# Patient Record
Sex: Male | Born: 1965 | Race: White | Hispanic: No | Marital: Single | Smoking: Current every day smoker
Health system: Southern US, Community
[De-identification: ages and names within clinical notes are randomized; demographics above are authoritative.]

## PROBLEM LIST (undated history)

## (undated) DIAGNOSIS — R000 Tachycardia, unspecified: Secondary | ICD-10-CM

## (undated) DIAGNOSIS — F419 Anxiety disorder, unspecified: Secondary | ICD-10-CM

## (undated) DIAGNOSIS — I10 Essential (primary) hypertension: Secondary | ICD-10-CM

## (undated) DIAGNOSIS — J449 Chronic obstructive pulmonary disease, unspecified: Secondary | ICD-10-CM

## (undated) HISTORY — PX: FACIAL COSMETIC SURGERY: SHX629

## (undated) HISTORY — PX: THORACOTOMY: SHX5074

---

## 2004-12-01 ENCOUNTER — Ambulatory Visit: Payer: Self-pay | Admitting: Internal Medicine

## 2006-02-16 ENCOUNTER — Ambulatory Visit: Payer: Self-pay | Admitting: Internal Medicine

## 2006-03-27 ENCOUNTER — Ambulatory Visit: Payer: Self-pay | Admitting: Internal Medicine

## 2006-06-08 ENCOUNTER — Ambulatory Visit: Payer: Self-pay | Admitting: Internal Medicine

## 2006-08-08 ENCOUNTER — Ambulatory Visit: Payer: Self-pay | Admitting: Internal Medicine

## 2006-10-12 ENCOUNTER — Ambulatory Visit: Payer: Self-pay | Admitting: Internal Medicine

## 2006-12-15 ENCOUNTER — Ambulatory Visit: Payer: Self-pay | Admitting: Internal Medicine

## 2007-02-15 ENCOUNTER — Ambulatory Visit: Payer: Self-pay | Admitting: Internal Medicine

## 2007-04-04 DIAGNOSIS — F329 Major depressive disorder, single episode, unspecified: Secondary | ICD-10-CM | POA: Insufficient documentation

## 2007-04-04 DIAGNOSIS — I10 Essential (primary) hypertension: Secondary | ICD-10-CM | POA: Insufficient documentation

## 2007-04-04 DIAGNOSIS — F3289 Other specified depressive episodes: Secondary | ICD-10-CM | POA: Insufficient documentation

## 2007-06-18 ENCOUNTER — Ambulatory Visit: Payer: Self-pay | Admitting: Internal Medicine

## 2007-06-18 DIAGNOSIS — J449 Chronic obstructive pulmonary disease, unspecified: Secondary | ICD-10-CM | POA: Insufficient documentation

## 2007-06-18 DIAGNOSIS — F410 Panic disorder [episodic paroxysmal anxiety] without agoraphobia: Secondary | ICD-10-CM | POA: Insufficient documentation

## 2007-06-18 DIAGNOSIS — J309 Allergic rhinitis, unspecified: Secondary | ICD-10-CM | POA: Insufficient documentation

## 2007-10-11 ENCOUNTER — Ambulatory Visit: Payer: Self-pay | Admitting: Internal Medicine

## 2008-01-08 ENCOUNTER — Ambulatory Visit: Payer: Self-pay | Admitting: Internal Medicine

## 2008-01-08 DIAGNOSIS — F17210 Nicotine dependence, cigarettes, uncomplicated: Secondary | ICD-10-CM | POA: Insufficient documentation

## 2008-04-08 ENCOUNTER — Ambulatory Visit: Payer: Self-pay | Admitting: Internal Medicine

## 2008-04-29 ENCOUNTER — Telehealth: Payer: Self-pay | Admitting: Internal Medicine

## 2008-07-07 ENCOUNTER — Ambulatory Visit: Payer: Self-pay | Admitting: Internal Medicine

## 2008-11-26 ENCOUNTER — Emergency Department (HOSPITAL_COMMUNITY): Admission: EM | Admit: 2008-11-26 | Discharge: 2008-11-26 | Payer: Self-pay | Admitting: Emergency Medicine

## 2009-03-04 ENCOUNTER — Telehealth: Payer: Self-pay | Admitting: Internal Medicine

## 2009-03-10 ENCOUNTER — Ambulatory Visit: Payer: Self-pay | Admitting: Internal Medicine

## 2009-06-03 ENCOUNTER — Ambulatory Visit: Payer: Self-pay | Admitting: Internal Medicine

## 2009-06-16 ENCOUNTER — Telehealth: Payer: Self-pay | Admitting: Internal Medicine

## 2009-10-16 ENCOUNTER — Telehealth: Payer: Self-pay | Admitting: Internal Medicine

## 2009-10-17 ENCOUNTER — Ambulatory Visit: Payer: Self-pay | Admitting: Internal Medicine

## 2009-10-17 DIAGNOSIS — J019 Acute sinusitis, unspecified: Secondary | ICD-10-CM | POA: Insufficient documentation

## 2009-12-31 ENCOUNTER — Telehealth: Payer: Self-pay | Admitting: Internal Medicine

## 2010-11-04 NOTE — Progress Notes (Signed)
Summary: REQ FOR RETURN CALL?  Phone Note Call from Patient   Caller: Patient [phone] Call For: bonnye/Mounir Skipper Reason for Call: Talk to Nurse, Talk to Doctor Summary of Call: Pt called to speak with [facility], LPN  (currently w/ a pt).... Pt adv that he has had a cold x 3 wks, doesn't seem to be getting any better..... Pt req that Bonnye, LPN return his call so that he may have something sent into pharmacy.... Pt was adv that msg would be passed along but he may need to come in for OV.  Pt adv that he can be reached @ [phone].  Initial call taken by: Debbra Riding,  October 16, 2009 3:12 PM  Follow-up for Phone Call        cold for 3 weeks-cough,wheezing- needs to go to saturday clinic Follow-up by: Willy Eddy, LPN,  October 16, 2009 3:56 PM  Additional Follow-up for Phone Call Additional follow up Details #1::        Phone Call Completed-------Pt scheduled for Sat. Clinic (10/17/2009) @ 11am. Additional Follow-up by: Debbra Riding,  October 16, 2009 4:13 PM

## 2010-11-04 NOTE — Assessment & Plan Note (Signed)
Summary: 3 month rov/njr   Vital Signs:  Patient Profile:   45 Years Old Male Height:     72 inches Weight:      274 pounds Temp:     98.2 degrees F oral Pulse rate:   80 / minute Resp:     14 per minute BP sitting:   120 / 80  (left arm)  Vitals Entered By: Willy Eddy, LPN (April 09, 6643 10:16 AM)                 Chief Complaint:  roa.  History of Present Illness:  Follow-Up Visit      This is a 45 year old man who presents for Follow-up visit.  The patient denies chest pain, palpitations, dizziness, syncope, low blood sugar symptoms, high blood sugar symptoms, edema, SOB, DOE, PND, and orthopnea.  Since the last visit the patient notes no new problems or concerns.  The patient reports taking meds as prescribed and monitoring BP.  When questioned about possible medication side effects, the patient notes none.    Hypertension History:      He denies headache, chest pain, palpitations, dyspnea with exertion, orthopnea, PND, peripheral edema, visual symptoms, neurologic problems, syncope, and side effects from treatment.        Positive major cardiovascular risk factors include hypertension and current tobacco user.  Negative major cardiovascular risk factors include male age less than 23 years old.        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   Family History:    Reviewed history from 06/18/2007 and no changes required:       Family History Depression       Family Hsitory Headaches       Family History Hypertension  Social History:    Reviewed history from 06/18/2007 and no changes required:       Single       Current Smoker    Review of Systems  The  patient denies anorexia, fever, weight loss, weight gain, vision loss, decreased hearing, hoarseness, chest pain, syncope, dyspnea on exertion, peripheral edema, prolonged cough, headaches, hemoptysis, abdominal pain, melena, hematochezia, severe indigestion/heartburn, hematuria, incontinence, genital sores, muscle weakness, suspicious skin lesions, transient blindness, difficulty walking, depression, unusual weight change, abnormal bleeding, enlarged lymph nodes, angioedema, and breast masses.     Physical Exam  General:     Well-developed,well-nourished,in no acute distress; alert,appropriate and cooperative throughout examination Eyes:     pupils equal and pupils round.   Ears:     R ear normal and L ear normal.   Nose:     no external deformity, no external erythema, and no nasal discharge.   Lungs:     normal respiratory effort and no accessory muscle use.   Heart:     normal rate and regular rhythm.   Abdomen:     Bowel sounds positive,abdomen soft and non-tender without masses, organomegaly or hernias noted. Msk:     No deformity or scoliosis noted of thoracic or lumbar spine.   Pulses:  R and L carotid,radial,femoral,dorsalis pedis and posterior tibial pulses are full and equal bilaterally Neurologic:     alert & oriented X3.      Impression & Recommendations:  Problem # 1:  TOBACCO USE (ICD-305.1) Encouraged smoking cessation and discussed different methods for smoking cessation.   Problem # 2:  COPD (ICD-496) asthma and copd stable not cough  Problem # 3:  HYPERTENSION (ICD-401.9)  His updated medication list for this problem includes:    Benicar 40 Mg Tabs (Olmesartan medoxomil) .Marland Kitchen... Take 1 tablet once a day  BP today: 120/80 Prior BP: 132/84 (01/08/2008)  Prior 10 Yr Risk Heart Disease: Not enough information (01/08/2008)   Problem # 4:  DEPRESSION (ICD-311)  His updated medication list for this problem includes:    Cymbalta 60 Mg Cpep (Duloxetine  hcl) ..... One by mouth daily    Klonopin 1 Mg Tabs (Clonazepam) ..... One by mouth three times a day as needed Discussed treatment options, including trial of antidpressant medication. Will refer to behavioral health. Follow-up call in in 24-48 hours and recheck in 2 weeks, sooner as needed. Patient agrees to call if any worsening of symptoms or thoughts of doing harm arise. Verified that the patient has no suicidal ideation at this time.   Complete Medication List: 1)  Benicar 40 Mg Tabs (Olmesartan medoxomil) .... Take 1 tablet once a day 2)  Nasonex 50 Mcg/act Susp (Mometasone furoate) .... Spray 2 spray as directed once a day 3)  Cymbalta 60 Mg Cpep (Duloxetine hcl) .... One by mouth daily 4)  Klonopin 1 Mg Tabs (Clonazepam) .... One by mouth three times a day as needed  Hypertension Assessment/Plan:      The patient's hypertensive risk group is category B: At least one risk factor (excluding diabetes) with no target organ damage.  Today's blood pressure is 120/80.  His blood pressure goal is < 140/90.   Patient Instructions: 1)  Please schedule a follow-up appointment in 3 months.   ]

## 2010-11-04 NOTE — Assessment & Plan Note (Signed)
Summary: 3 month rov/njr   Vital Signs:  Patient Profile:   45 Years Old Male Height:     72 inches Weight:      278 pounds Temp:     98.5 degrees F oral Pulse rate:   80 / minute Resp:     14 per minute BP sitting:   146 / 82  (left arm) Cuff size:   small  Vitals Entered By: Willy Eddy, LPN (July 07, 2008 10:22 AM)                 Chief Complaint:  roa- out of cymbalta samples.  History of Present Illness: out of symbalta for two weeks had increased temperment and anxiety blood pressure follow up  Hypertension History:      He denies headache, chest pain, palpitations, dyspnea with exertion, orthopnea, PND, peripheral edema, visual symptoms, neurologic problems, syncope, and side effects from treatment.        Positive major cardiovascular risk factors include hypertension and current tobacco user.  Negative major cardiovascular risk factors include male age less than 84 years old.       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   Family History:    Reviewed history from 06/18/2007 and no changes required:       Family History Depression       Family Hsitory Headaches       Family History Hypertension  Social History:    Reviewed history from 06/18/2007 and no changes required:       Single       Current Smoker    Review of Systems  The patient denies anorexia, fever, weight loss, weight gain, vision loss, decreased hearing, hoarseness, chest pain, syncope, dyspnea on exertion, peripheral edema, prolonged cough, headaches, hemoptysis, abdominal pain, melena, hematochezia, severe indigestion/heartburn, hematuria, incontinence, genital sores, muscle  weakness, suspicious skin lesions, transient blindness, difficulty walking, depression, unusual weight change, abnormal bleeding, enlarged lymph nodes, angioedema, breast masses, and testicular masses.     Physical Exam  General:     Well-developed,well-nourished,in no acute distress; alert,appropriate and cooperative throughout examination Eyes:     pupils equal and pupils round.   Nose:     no external deformity, no external erythema, and no nasal discharge.   Mouth:     Oral mucosa and oropharynx without lesions or exudates.  Teeth in good repair. Lungs:     normal respiratory effort and no accessory muscle use.   Heart:     normal rate and regular rhythm.   Abdomen:     Bowel sounds positive,abdomen soft and non-tender without masses, organomegaly or hernias noted. Msk:     No deformity or scoliosis noted of thoracic or lumbar spine.   Pulses:     R and L carotid,radial,femoral,dorsalis pedis and posterior tibial pulses are full and equal bilaterally Extremities:     No clubbing, cyanosis, edema, or deformity noted with normal full range of motion of all joints.   Skin:     Intact without suspicious lesions or rashes Psych:  Oriented X3, memory intact for recent and remote, normally interactive, and labile affect.      Impression & Recommendations:  Problem # 1:  HYPERTENSION (ICD-401.9) repeat   130/80 His updated medication list for this problem includes:    Benicar 40 Mg Tabs (Olmesartan medoxomil) .Marland Kitchen... Take 1 tablet once a day  BP today: 146/82 Prior BP: 120/80 (04/08/2008)  Prior 10 Yr Risk Heart Disease: Not enough information (01/08/2008)   Problem # 2:  TOBACCO USE (ICD-305.1) Encouraged smoking cessation and discussed different methods for smoking cessation.  discussion of cessation ( economic forces have worked to decrease the amount of smoking)   Problem # 3:  COPD (ICD-496) still smoking,    [place] of the lozengers  Problem # 4:  ALLERGIC  RHINITIS (ICD-477.9) discussion of seasonla aggergies and use of OTC and meds The following medications were removed from the medication list:    Promethazine Hcl 25 Mg Tabs (Promethazine hcl) .Marland Kitchen... 1 every 4-6 hours as needed nausea and vomiting  His updated medication list for this problem includes:    Nasonex 50 Mcg/act Susp (Mometasone furoate) ..... Spray 2 spray as directed once a day Discussed use of allergy medications and environmental measures.   Problem # 5:  DEPRESSION (ICD-311)  His updated medication list for this problem includes:    Cymbalta 60 Mg Cpep (Duloxetine hcl) ..... One by mouth daily    Klonopin 1 Mg Tabs (Clonazepam) ..... One by mouth three times a day as needed Discussed treatment options, including trial of antidpressant medication. Will refer to behavioral health. Follow-up call in in 24-48 hours and recheck in 2 weeks, sooner as needed. Patient agrees to call if any worsening of symptoms or thoughts of doing harm arise. Verified that the patient has no suicidal ideation at this time.   Complete Medication List: 1)  Benicar 40 Mg Tabs (Olmesartan medoxomil) .... Take 1 tablet once a day 2)  Nasonex 50 Mcg/act Susp (Mometasone furoate) .... Spray 2 spray as directed once a day 3)  Cymbalta 60 Mg Cpep (Duloxetine hcl) .... One by mouth daily 4)  Klonopin 1 Mg Tabs (Clonazepam) .... One by mouth three times a day as needed  Hypertension Assessment/Plan:      The patient's hypertensive risk group is category B: At least one risk factor (excluding diabetes) with no target organ damage.  Today's blood pressure is 146/82.  His blood pressure goal is < 140/90.   Patient Instructions: 1)  Please schedule a follow-up appointment in 2 months.   ]

## 2010-11-04 NOTE — Assessment & Plan Note (Signed)
Summary: 2 MONGH ROA/JLS/pt rescd/ccm   Vital Signs:  Patient Profile:   45 Years Old Male Height:     72 inches Weight:      286 pounds Temp:     97.5 degrees F oral Pulse rate:   76 / minute Resp:     14 per minute BP sitting:   130 / 80  (left arm)  Vitals Entered By: Willy Eddy, LPN (June 18, 2007 3:53 PM)                 Chief Complaint:  roa after changing effexor to pristiq.  History of Present Illness: Effexor works best resumed smoking Pristique not working as well. Has had panic attacks. 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   Family History:    Reviewed history and no changes required:       Family History Depression       Family Hsitory Headaches       Family History Hypertension  Social History:    Reviewed history and no changes required:       Single       Current Smoker   Risk Factors:  Tobacco use:  current   Review of Systems  The patient denies fever, weight loss, vision loss, decreased hearing, hoarseness, chest pain, syncope, dyspnea on exhertion, peripheral edema, prolonged cough, abdominal pain, melena, severe indigestion/heartburn, muscle weakness, suspicious skin lesions, unusual weight change, abnormal bleeding, enlarged lymph nodes, and angioedema.     Physical Exam  General:     Well-developed,well-nourished,in no acute distress; alert,appropriate and cooperative throughout examination Head:     normocephalic.   Eyes:     pupils equal and pupils round.   Ears:     R ear normal and L ear normal.   Nose:     no external deformity, no external erythema, and no nasal discharge.   Abdomen:     soft, no guarding, and  no rigidity.   Pulses:     R and L carotid,radial,femoral,dorsalis pedis and posterior tibial pulses are full and equal bilaterally Extremities:     No clubbing, cyanosis, edema, or deformity noted with normal full range of motion of all joints.   Neurologic:     alert & oriented X3.      Impression & Recommendations:  Problem # 1:  PANIC ATTACK, ACUTE (ICD-300.01)  The following medications were removed from the medication list:    Effexor Xr 150 Mg Cp24 (Venlafaxine hcl) .Marland Kitchen... Take 1 capsule by mouth once a day  His updated medication list for this problem includes:    Cymbalta 60 Mg Cpep (Duloxetine hcl) ..... One by mouth daily    Klonopin 1 Mg Tabs (Clonazepam) ..... One by mouth tid Discussed medication use and relaxation techniques.   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 Take 1 tablet once a day  BP today: 130/80   Problem # 4:  ALLERGIC RHINITIS (ICD-477.9)  His updated medication list for this problem includes:    Nasonex 50 Mcg/act Susp (Mometasone furoate) ..... Spray 2 spray as directed once a day Discussed use of allergy medications and environmental measures.   Complete Medication List: 1)  Benicar 40 Mg Tabs (Olmesartan medoxomil) .... Take 1 tablet once a day 2)  Nasonex 50 Mcg/act Susp (Mometasone furoate) .... Spray 2 spray as directed once a day 3)  Cymbalta 60 Mg Cpep (Duloxetine hcl) .... One by mouth daily 4)  Klonopin 1 Mg Tabs (Clonazepam) .... One by mouth tid   Patient Instructions: 1)  Please schedule a follow-up appointment in 6-7weeks.    Prescriptions: KLONOPIN 1 MG  TABS (CLONAZEPAM) one by mouth tid  #90 x 6   Entered and Authorized by:   Stacie Glaze MD   Signed by:   Stacie Glaze MD on 06/18/2007   Method used:   Print then Give to Patient   RxID:    [id number]  ]

## 2010-11-04 NOTE — Assessment & Plan Note (Signed)
Summary: ANXIETY ATTACKS/BMW   Vital Signs:  Patient profile:   45 year old male Height:      72 inches Weight:      264 pounds BMI:     35.93 Temp:     98.2 degrees F oral Pulse rate:   76 / minute Resp:     14 per minute BP sitting:   150 / 90  (left arm)  Vitals Entered By: Willy Eddy, LPN (March 10, 9810 3:58 PM)  CC:  c/o increased anxiety but since starting back on klonopin 3 days ago is feeling better- not taking any other medsc.  History of Present Illness:  Follow-Up Visit      This is a 45 year old man who presents for Follow-up visit.  The patient denies chest pain, palpitations, dizziness, syncope, low blood sugar symptoms, high blood sugar symptoms, edema, SOB, DOE, PND, and orthopnea.  Since the last visit the patient notes problems with medications.  The patient reports taking meds as prescribed.  When questioned about possible medication side effects, the patient notes none.    Follow-Up Visit      panic attacks.  The patient denies chest pain, palpitations, dizziness, syncope, low blood sugar symptoms, high blood sugar symptoms, edema, SOB, DOE, PND, and orthopnea.  The patient reports not taking meds as prescribed and not monitoring BP.    Hypertension History:      He denies headache, chest pain, palpitations, dyspnea with exertion, orthopnea, PND, peripheral edema, visual symptoms, neurologic problems, syncope, and side effects from treatment.  out of meds.        Positive major cardiovascular risk factors include hypertension and current tobacco user.  Negative major cardiovascular risk factors include male age less than 47 years old.    Current Medications (verified): 1)  Benicar 40 Mg Tabs (Olmesartan Medoxomil) .Marland Kitchen.. 1 Once Daily Hold 2)  Cymbalta 60 Mg  Cpep (Duloxetine Hcl) .... One By Mouth Dailyhold 3)  Klonopin 1 Mg  Tabs (Clonazepam) .... One By Mouth Three Times A Day As Needed  Allergies (verified): No Known Drug Allergies  Past  History:  Family History: Last updated: 06/18/2007 Family History Depression Family Hsitory Headaches Family History Hypertension  Social History: Last updated: 06/18/2007 Single Current Smoker  Risk Factors: Smoking Status: current (06/18/2007)  Past medical, surgical, family and social histories (including risk factors) reviewed, and no changes noted (except as noted below).  Past Medical History: Reviewed history from 06/18/2007 and no changes required. Depression Hypertension COPD Allergic rhinitis  Past Surgical History: Reviewed history from 10/11/2007 and no changes required. 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 Family History Depression Family Hsitory Headaches Family History Hypertension  Social History: Reviewed history from 06/18/2007 and no changes required. Single Current Smoker  Review of Systems  The patient denies anorexia, fever, weight loss, weight gain, vision loss, decreased hearing, hoarseness, chest pain, syncope, dyspnea on exertion, peripheral edema, prolonged cough, headaches, hemoptysis, abdominal pain, melena, hematochezia, severe indigestion/heartburn, hematuria, incontinence, genital sores, muscle weakness, suspicious skin lesions, transient blindness, difficulty walking, depression, unusual weight change, abnormal bleeding, enlarged lymph nodes, angioedema, breast masses, and testicular masses.         panic attacks  Physical Exam  General:  Well-developed,well-nourished,in no acute distress; alert,appropriate and cooperative throughout examination Head:  normocephalic.   Ears:  R ear normal and L ear normal.   Nose:  no external deformity,  no external erythema, and no nasal discharge.   Mouth:  Oral mucosa and oropharynx without lesions or exudates.  Teeth in good repair. Lungs:  normal respiratory effort and no accessory muscle use.   Heart:   normal rate and regular rhythm.   Abdomen:  Bowel sounds positive,abdomen soft and non-tender without masses, organomegaly or hernias noted. Msk:  No deformity or scoliosis noted of thoracic or lumbar spine.     Impression & Recommendations:  Problem # 1:  PANIC ATTACK, ACUTE (ICD-300.01)  The following medications were removed from the medication list:    Cymbalta 60 Mg Cpep (Duloxetine hcl) ..... One by mouth daily    Klonopin 1 Mg Tabs (Clonazepam) ..... One by mouth three times a day as needed His updated medication list for this problem includes:    Desipramine Hcl 50 Mg Tabs (Desipramine hcl) ..... One by mouth q hs    Alprazolam 0.5 Mg Tabs (Alprazolam) ..... One by mouth three times a day as needed panic  Discussed medication use and relaxation techniques.   Problem # 2:  COPD (ICD-496)  Problem # 3:  HYPERTENSION (ICD-401.9) out of meds His updated medication list for this problem includes:    Benicar 40 Mg Tabs (Olmesartan medoxomil) .Marland Kitchen... 1 once daily hold  BP today: 150/90 Prior BP: 146/82 (07/07/2008)  Prior 10 Yr Risk Heart Disease: Not enough information (01/08/2008)  Complete Medication List: 1)  Benicar 40 Mg Tabs (Olmesartan medoxomil) .Marland Kitchen.. 1 once daily hold 2)  Desipramine Hcl 50 Mg Tabs (Desipramine hcl) .... One by mouth q hs 3)  Alprazolam 0.5 Mg Tabs (Alprazolam) .... One by mouth three times a day as needed panic 4)  Chantix Starting Month Pak 0.5 Mg X 11 & 1 Mg X 42 Tabs (Varenicline tartrate) .... One pk take as directed  Hypertension Assessment/Plan:      The patient's hypertensive risk group is category B: At least one risk factor (excluding diabetes) with no target organ damage.  Today's blood pressure is 150/90.  His blood pressure goal is < 140/90.   Patient Instructions: 1)  Please schedule a follow-up appointment in 1 month. Prescriptions: CHANTIX STARTING MONTH PAK 0.5 MG X 11 & 1 MG X 42 TABS (VARENICLINE TARTRATE) one pk take as directed   #1 pk x 0   Entered and Authorized by:   Stacie Glaze MD   Signed by:   Stacie Glaze MD on 03/10/2009   Method used:   Print then Give to Patient   RxID:   [id number] ALPRAZOLAM 0.5 MG TABS (ALPRAZOLAM) one by mouth three times a day as needed panic  #90 x 2   Entered and Authorized by:   Stacie Glaze MD   Signed by:   Stacie Glaze MD on 03/10/2009   Method used:   Print then Give to Patient   RxID:   [id number] DESIPRAMINE HCL 50 MG TABS (DESIPRAMINE HCL) one by mouth q HS  #30 x 11   Entered and Authorized by:   Stacie Glaze MD   Signed by:   Stacie Glaze MD on 03/10/2009   Method used:   Electronically to        [facility] [address] (retail)       [phone]       [place], [place]  [address]       Ph: [phone]  Fax: [phone]   RxID:   [id number]

## 2010-11-04 NOTE — Progress Notes (Signed)
Summary: pts wants a returned call ASAP  Phone Note Call from Patient Call back at [phone] cell   Caller: Patient Call For: Stacie Glaze MD Summary of Call: Patient called and said that they are having panic attacks. Wanted to come in and See Dr Lovell Sheehan, but first available appts are 6/15. Please call patient.  Initial call taken by: Lucy Antigua,  March 04, 2009 4:03 PM      Appended Document: pts wants a returned call ASAP APOPINTMENT GIVEN ON 6-8 AT 3:30 AND KLONOPIN TO BE TAKEN three times a day REGULARLY NOT as needed- CALLED INTO [facility] [address]

## 2010-11-04 NOTE — Progress Notes (Signed)
Summary: Bil leg weakness/nausea  Phone Note Call from Patient Call back at Home Phone [phone]   Caller: Patient Call For: Ray Griffin Reason for Call: Privacy/Consent Authorization Summary of Call: Pt and father same sx of bil leg weakness and soreness, feeling unsteady on feet, pt vomited 2 times in the middle of the night, vomiting has stopped, now ongoing nausea.  Pt denies diarrhea, fever, chills, cough, sore throat.  Pt reports feeling dehydrated, no appetite.  Instructed to remain in clear liquids for not, recommended Gatorade and/or water until Dr Ray Griffin can respond. [facility] [address] Initial call taken by: Sid Falcon LPN,  April 29, 2008 10:33 AM  Follow-up for Phone Call        per dr Ray Griffin call in phenergan 25 # 10 as needed n%v.pt's father informed and med sent in thru emr, also tamiflu otc Follow-up by: Willy Eddy, LPN,  April 29, 2008 12:16 PM    New/Updated Medications: PROMETHAZINE HCL 25 MG  TABS (PROMETHAZINE HCL) 1 every 4-6 hours as needed nausea and vomiting   Prescriptions: PROMETHAZINE HCL 25 MG  TABS (PROMETHAZINE HCL) 1 every 4-6 hours as needed nausea and vomiting  #10 x 0   Entered by:   Willy Eddy, LPN   Authorized by:   Stacie Glaze MD   Signed by:   Willy Eddy, LPN on 14/78/2956   Method used:   Electronically sent to ...       [facility] [address] [phone]*       [address]       [place], [place]  [address]       Ph: [phone]       Fax: [phone]   RxID:   [id number]

## 2010-11-04 NOTE — Assessment & Plan Note (Signed)
Summary: 3 month roa/jls   Vital Signs:  Patient Profile:   45 Years Old Male Height:     72 inches Weight:      285 pounds Temp:     98.1 degrees F oral Pulse rate:   80 / minute Resp:     14 per minute BP sitting:   132 / 84  (left arm) Cuff size:   large  Vitals Entered By: Willy Eddy, LPN (January 07, 9146 2:40 PM)                 Chief Complaint:  roa.  History of Present Illness: mood OK and feels well Current Problems:  PANIC ATTACK, ACUTE (ICD-300.01) FAMILY HISTORY DEPRESSION (ICD-V17.0) ALLERGIC RHINITIS (ICD-477.9)  atllergies stable COPD (ICD-496)   breathing OK needs to quit SMX HYPERTENSION (ICD-401.9) stable DEPRESSION (ICD-311)  stable    Hypertension History:      He denies headache, chest pain, palpitations, dyspnea with exertion, orthopnea, PND, peripheral edema, visual symptoms, neurologic problems, syncope, and side effects from treatment.        Positive major cardiovascular risk factors include hypertension and current tobacco user.  Negative major cardiovascular risk factors include male age less than 76 years old.       Current Allergies: No known allergies    Family History:    Reviewed history from 06/18/2007 and no changes required:       Family History Depression       Family Hsitory Headaches       Family History Hypertension  Social History:    Reviewed history from 06/18/2007 and no changes required:       Single       Current Smoker    Review of Systems  The patient denies anorexia, fever, weight loss, weight gain, vision loss, decreased hearing, hoarseness, chest pain, syncope, dyspnea on exhertion, peripheral edema, prolonged cough, hemoptysis, abdominal pain, melena, hematochezia, severe indigestion/heartburn, hematuria, incontinence, genital sores, muscle weakness, suspicious skin lesions, transient blindness, difficulty walking, depression, unusual weight change, abnormal bleeding, enlarged lymph nodes,  angioedema, breast masses, and testicular masses.     Physical Exam  General:     Well-developed,well-nourished,in no acute distress; alert,appropriate and cooperative throughout examination Head:     normocephalic.   Ears:     R ear normal and L ear normal.   Nose:     no external deformity, no external erythema, and no nasal discharge.   Lungs:     normal respiratory effort and no accessory muscle use.   Heart:     normal rate and regular rhythm.   Abdomen:     Bowel sounds positive,abdomen soft and non-tender without masses, organomegaly or hernias noted. Msk:     No deformity or scoliosis noted of thoracic or lumbar spine.   Extremities:     No clubbing, cyanosis, edema, or deformity noted with normal full range of motion of all joints.   Neurologic:     alert & oriented X3.      Impression & Recommendations:  Problem # 1:  TOBACCO USE (ICD-305.1) Encouraged smoking cessation and discussed different methods for smoking cessation.  Orders: Tobacco use cessation intermediate 3-10 minutes (82956)   Problem # 2:  COPD (ICD-496) Assessment: Unchanged  Problem # 3:  PANIC ATTACK, ACUTE (ICD-300.01)  His updated medication list for this problem includes:    Cymbalta 60 Mg Cpep (Duloxetine hcl) ..... One by mouth daily    Klonopin 1 Mg  Tabs (Clonazepam) ..... One by mouth three times a day as needed infrequent attacks Discussed medication use and relaxation techniques.   Problem # 4:  DEPRESSION (ICD-311)  His updated medication list for this problem includes:    Cymbalta 60 Mg Cpep (Duloxetine hcl) ..... One by mouth daily    Klonopin 1 Mg Tabs (Clonazepam) ..... One by mouth three times a day as needed Discussed treatment options, including trial of antidpressant medication. Will refer to behavioral health. Follow-up call in in 24-48 hours and recheck in 2 weeks, sooner as needed. Patient agrees to call if any worsening of symptoms or thoughts of doing harm arise.  Verified that the patient has no suicidal ideation at this time.   Complete Medication List: 1)  Benicar 40 Mg Tabs (Olmesartan medoxomil) .... Take 1 tablet once a day 2)  Nasonex 50 Mcg/act Susp (Mometasone furoate) .... Spray 2 spray as directed once a day 3)  Cymbalta 60 Mg Cpep (Duloxetine hcl) .... One by mouth daily 4)  Klonopin 1 Mg Tabs (Clonazepam) .... One by mouth three times a day as needed  Hypertension Assessment/Plan:      The patient's hypertensive risk group is category B: At least one risk factor (excluding diabetes) with no target organ damage.  Today's blood pressure is 132/84.  His blood pressure goal is < 140/90.   Patient Instructions: 1)  Please schedule a follow-up appointment in 3 months.    ]

## 2010-11-04 NOTE — Progress Notes (Signed)
SummaryScarlette Griffin  Phone Note Refill Request Message from:  [id number] [phone] on December 31, 2009 12:47 PM  Refills Requested: Medication #1:  KLONOPIN 1 MG TABS 1 by mouth uo to three times a day   Last Refilled: 10/23/2009 E-Scribe Request not Dr. Karle Starch patient was seen on Sat. clinic   Method Requested: Telephone to Pharmacy Initial call taken by: Mervin Hack CMA Duncan Dull),  December 31, 2009 12:48 PM    Prescriptions: KLONOPIN 1 MG TABS (CLONAZEPAM) 1 by mouth uo to three times a day  #90 x 3   Entered by:   Willy Eddy, LPN   Authorized by:   Stacie Glaze MD   Signed by:   Willy Eddy, LPN on 81/19/1478   Method used:   Telephoned to ...       [facility] [address] [phone]* (retail)       [address]       [place], [place]  [address]       Ph: [phone]       Fax: [phone]   RxID:   [id number]

## 2010-11-04 NOTE — Assessment & Plan Note (Signed)
Summary: CONGESTION, COUGH // RS   Vital Signs:  Patient profile:   45 year old male Weight:      254 pounds O2 Sat:      95 % on Room air Temp:     98.7 degrees F oral Pulse rate:   82 / minute Resp:     18 per minute BP sitting:   152 / 94  (left arm)  Vitals Entered By: Doristine Devoid (October 17, 2009 11:14 AM)  O2 Flow:  Room air CC: sinus congestion and cough    History of Present Illness: Has been sick for about 3 weeks started with severe symptoms for first couple of days-- "severe cold" now set into chest and sinuses Has emphysema  No known fever Has had bad chest congestion and worsened cough at night supine hard to sleep SOB only with cough Cough productive of white phlegm  No sig nasal drainage some PND Some sore throat --mostly from the cough Has headache No otalgia  tried nyquil and robitussin--then felt better so stopped Then worsened so started OTC meds again  Allergies: No Known Drug Allergies  Past History:  Past medical, surgical, family and social histories (including risk factors) reviewed for relevance to current acute and chronic problems.  Past Medical History: Reviewed history from 06/18/2007 and no changes required. Depression Hypertension COPD Allergic rhinitis  Past Surgical History: Reviewed history from 10/11/2007 and no changes required. 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 Family History Depression Family Hsitory Headaches Family History Hypertension  Social History: Reviewed history from 06/18/2007 and no changes required. Single Current Smoker  Review of Systems       Felt close to vomiting early this AM from the drainage appetite has been okay  Physical Exam  General:  alert.  NAD Head:  no sinus tenderness Ears:  cerumen deep in right L TM normal Nose:  moderate congestion Mouth:  no erythema and no exudates.     Neck:  supple, no masses, and no cervical lymphadenopathy.   Lungs:  normal respiratory effort, no intercostal retractions, no accessory muscle use, no crackles, and no wheezes.   Decreased breath sounds and widespread rhonchi   Impression & Recommendations:  Problem # 1:  SINUSITIS - ACUTE-NOS (ICD-461.9) Assessment New  clearly seems to have bacterial secondary infection fortunately, doesn't seem to have exacerbation of his lung disease  will treat with amoxil supportive care  His updated medication list for this problem includes:    Amoxicillin 500 Mg Tabs (Amoxicillin) .Marland Kitchen... 2 tabs by mouth two times a day as needed for sinus infection  Problem # 2:  PANIC ATTACK, ACUTE (ICD-300.01) Assessment: Comment Only due for refill of clonazepam will give 1 month  His updated medication list for this problem includes:    Klonopin 1 Mg Tabs (Clonazepam) .Marland Kitchen... 1 by mouth uo to three times a day    Cymbalta 60 Mg Cpep (Duloxetine hcl) ..... One by mouth daily  Complete Medication List: 1)  Benicar 40 Mg Tabs (Olmesartan medoxomil) .Marland Kitchen.. 1 once daily hold 2)  Klonopin 1 Mg Tabs (Clonazepam) .Marland Kitchen.. 1 by mouth uo to three times a day 3)  Cymbalta 60 Mg Cpep (Duloxetine hcl) .... One by mouth daily 4)  Amoxicillin 500 Mg Tabs (Amoxicillin) .... 2 tabs by mouth two times a day as needed for sinus infection  Patient Instructions: 1)  Please schedule a follow-up appointment as needed .  Prescriptions: AMOXICILLIN 500 MG TABS (AMOXICILLIN) 2 tabs by mouth two times a day as needed for sinus infection  #40 x 0   Entered and Authorized by:   Cindee Salt MD   Signed by:   Cindee Salt MD on 10/17/2009   Method used:   Print then Give to Patient   RxID:   [id number] KLONOPIN 1 MG TABS (CLONAZEPAM) 1 by mouth uo to three times a day  #90 x 0   Entered and Authorized by:   Cindee Salt MD   Signed by:   Cindee Salt MD on 10/17/2009   Method used:   Print then  Give to Patient   RxID:   [id number]

## 2010-11-04 NOTE — Assessment & Plan Note (Signed)
Summary: 1 mo rov/mm/PT RESCD//CCM   Vital Signs:  Patient profile:   45 year old male Height:      72 inches Weight:      250 pounds Temp:     98.2 degrees F oral Pulse rate:   80 / minute Resp:     14 per minute BP sitting:   140 / 82  (left arm)  Vitals Entered By: Willy Eddy, LPN (June 03, 2009 3:37 PM)  CC:  roa.  History of Present Illness: pt has gone of probation and works as a Building services engineer felt better with the klonopin does not have insurance yet through work ( they have not offered) would like to go back on cybalta due to stress and pressure in live and risks of this leading to depression and recidivisim  Hypertension History:      He denies headache, chest pain, palpitations, dyspnea with exertion, orthopnea, PND, peripheral edema, visual symptoms, neurologic problems, syncope, and side effects from treatment.        Positive major cardiovascular risk factors include hypertension and current tobacco user.  Negative major cardiovascular risk factors include male age less than 47 years old.     Problems Prior to Update: 1)  Tobacco Use  (ICD-305.1) 2)  Panic Attack, Acute  (ICD-300.01) 3)  Family History Depression  (ICD-V17.0) 4)  Allergic Rhinitis  (ICD-477.9) 5)  COPD  (ICD-496) 6)  Hypertension  (ICD-401.9) 7)  Depression  (ICD-311)  Medications Prior to Update: 1)  Benicar 40 Mg Tabs (Olmesartan Medoxomil) .Marland Kitchen.. 1 Once Daily Hold 2)  Desipramine Hcl 50 Mg Tabs (Desipramine Hcl) .... One By Mouth Q Hs 3)  Alprazolam 0.5 Mg Tabs (Alprazolam) .... One By Mouth Three Times A Day As Needed Panic 4)  Chantix Starting Month Pak 0.5 Mg X 11 & 1 Mg X 42 Tabs (Varenicline Tartrate) .... One Pk Take As Directed  Current Medications (verified): 1)  Benicar 40 Mg Tabs (Olmesartan Medoxomil) .Marland Kitchen.. 1 Once Daily Hold 2)  Klonopin 1 Mg Tabs (Clonazepam) .... Upo To Three Times A Day 3)  Cymbalta 30 Mg Cpep (Duloxetine Hcl) .... One By Mouth Daily  Allergies  (verified): No Known Drug Allergies  Past History:  Family History: Last updated: 06/18/2007 Family History Depression Family Hsitory Headaches Family History Hypertension  Social History: Last updated: 06/18/2007 Single Current Smoker  Risk Factors: Smoking Status: current (06/18/2007)  Past medical, surgical, family and social histories (including risk factors) reviewed, and no changes noted (except as noted below).  Past Medical History: Reviewed history from 06/18/2007 and no changes required. Depression Hypertension COPD Allergic rhinitis  Past Surgical History: Reviewed history from 10/11/2007 and no changes required. 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 Family History Depression Family Hsitory Headaches Family History Hypertension  Social History: Reviewed history from 06/18/2007 and no changes required. Single Current Smoker  Review of Systems  The patient denies anorexia, fever, weight loss, weight gain, vision loss, decreased hearing, hoarseness, chest pain, syncope, dyspnea on exertion, peripheral edema, prolonged cough, headaches, hemoptysis, abdominal pain, melena, hematochezia, severe indigestion/heartburn, hematuria, incontinence, genital sores, muscle weakness, suspicious skin lesions, transient blindness, difficulty walking, depression, unusual weight change, abnormal bleeding, enlarged lymph nodes, angioedema, and breast masses.    Physical Exam  General:  Well-developed,well-nourished,in no acute distress; alert,appropriate and cooperative throughout examination Head:  normocephalic.   Eyes:  pupils equal and pupils round.  Ears:  R ear normal and L ear normal.   Nose:  no external deformity, no external erythema, and no nasal discharge.   Mouth:  Oral mucosa and oropharynx without lesions or exudates.  Teeth in good repair. Lungs:  normal respiratory  effort and no accessory muscle use.   Heart:  normal rate and regular rhythm.   Abdomen:  Bowel sounds positive,abdomen soft and non-tender without masses, organomegaly or hernias noted. Msk:  No deformity or scoliosis noted of thoracic or lumbar spine.   Pulses:  R and L carotid,radial,femoral,dorsalis pedis and posterior tibial pulses are full and equal bilaterally Extremities:  No clubbing, cyanosis, edema, or deformity noted with normal full range of motion of all joints.     Impression & Recommendations:  Problem # 1:  HYPERTENSION (ICD-401.9) Assessment Unchanged  His updated medication list for this problem includes:    Benicar 40 Mg Tabs (Olmesartan medoxomil) .Marland Kitchen... 1 once daily hold  BP today: 140/82 Prior BP: 150/90 (03/10/2009)  Prior 10 Yr Risk Heart Disease: Not enough information (01/08/2008)  Problem # 2:  DEPRESSION (ICD-311) Assessment: Unchanged  The following medications were removed from the medication list:    Desipramine Hcl 50 Mg Tabs (Desipramine hcl) ..... One by mouth q hs    Alprazolam 0.5 Mg Tabs (Alprazolam) ..... One by mouth three times a day as needed panic His updated medication list for this problem includes:    Klonopin 1 Mg Tabs (Clonazepam) ..... Upo to three times a day    Cymbalta 60 Mg Cpep (Duloxetine hcl) ..... One by mouth daily  Discussed treatment options, including trial of antidpressant medication. Will refer to behavioral health. Follow-up call in in 24-48 hours and recheck in 2 weeks, sooner as needed. Patient agrees to call if any worsening of symptoms or thoughts of doing harm arise. Verified that the patient has no suicidal ideation at this time.   Problem # 3:  PANIC ATTACK, ACUTE (ICD-300.01)  The following medications were removed from the medication list:    Desipramine Hcl 50 Mg Tabs (Desipramine hcl) ..... One by mouth q hs    Alprazolam 0.5 Mg Tabs (Alprazolam) ..... One by mouth three times a day as needed panic His  updated medication list for this problem includes:    Klonopin 1 Mg Tabs (Clonazepam) ..... Upo to three times a day    Cymbalta 60 Mg Cpep (Duloxetine hcl) ..... One by mouth daily  Discussed medication use and relaxation techniques.   Complete Medication List: 1)  Benicar 40 Mg Tabs (Olmesartan medoxomil) .Marland Kitchen.. 1 once daily hold 2)  Klonopin 1 Mg Tabs (Clonazepam) .... Upo to three times a day 3)  Cymbalta 60 Mg Cpep (Duloxetine hcl) .... One by mouth daily  Hypertension Assessment/Plan:      The patient's hypertensive risk group is category B: At least one risk factor (excluding diabetes) with no target organ damage.  Today's blood pressure is 140/82.  His blood pressure goal is < 140/90.  Patient Instructions: 1)  Please schedule a follow-up appointment in 3 months. Prescriptions: KLONOPIN 1 MG TABS (CLONAZEPAM) upo to three times a day  #90 x 6   Entered and Authorized by:   Stacie Glaze MD   Signed by:   Stacie Glaze MD on 06/03/2009   Method used:   Print then Give to Patient   RxID:   [id number]

## 2010-11-04 NOTE — Assessment & Plan Note (Signed)
Summary: 1 mnth roa-smm/PT RESCD/CCM rsc per pt/njr   Vital Signs:  Patient Profile:   45 Years Old Male Height:     72 inches Weight:      287 pounds Temp:     98.2 degrees F rectal Pulse rate:   76 / minute Pulse rhythm:   regular Resp:     20 per minute BP sitting:   128 / 72  Vitals Entered By: Lynann Beaver CMA (October 11, 2007 2:25 PM)                 Chief Complaint:  rov.  History of Present Illness: Follow up of medical problems and mood disorder with controll of anger  Depression History:      The patient denies significant weight loss, significant weight gain, insomnia, hypersomnia, psychomotor agitation, psychomotor retardation, fatigue (loss of energy), feelings of worthlessness (guilt), impaired concentration (indecisiveness), and recurrent thoughts of death or suicide.  The patient denies symptoms of a manic disorder including persistently & abnormally elevated mood, abnormally & persistently irritable mood, less need for sleep, talkative or feels need to keep talking, distractibility, flight of ideas, increase in goal-directed activity, psychomotor agitation, inflated self-esteem or grandiosity, excessive buying sprees, excessive sexual indiscretions, and excessive foolish business investments.         Current Allergies (reviewed today): No known allergies  Updated/Current Medications (including changes made in today's visit):  BENICAR 40 MG TABS (OLMESARTAN MEDOXOMIL) Take 1 tablet once a day NASONEX 50 MCG/ACT SUSP (MOMETASONE FUROATE) Spray 2 spray as directed once a day CYMBALTA 60 MG  CPEP (DULOXETINE HCL) one by mouth daily KLONOPIN 1 MG  TABS (CLONAZEPAM) one by mouth tid   Past Medical History:    Reviewed history from 06/18/2007 and no changes required:       Depression       Hypertension       COPD       Allergic rhinitis  Past Surgical History:    Reviewed history from 06/18/2007 and no changes required:       chest tube for coalpsed lung  from bullous emphasema   Family History:    Reviewed history from 06/18/2007 and no changes required:       Family History Depression       Family Hsitory Headaches       Family History Hypertension  Social History:    Reviewed history from 06/18/2007 and no changes required:       Single       Current Smoker    Review of Systems  The patient denies anorexia, fever, weight loss, vision loss, decreased hearing, hoarseness, chest pain, syncope, dyspnea on exhertion, peripheral edema, prolonged cough, hemoptysis, abdominal pain, melena, hematochezia, severe indigestion/heartburn, hematuria, incontinence, genital sores, muscle weakness, suspicious skin lesions, transient blindness, difficulty walking, depression, unusual weight change, abnormal bleeding, enlarged lymph nodes, angioedema, breast masses, and testicular masses.     Physical Exam  General:     Well-developed,well-nourished,in no acute distress; alert,appropriate and cooperative throughout examination Head:     normocephalic.   Ears:     R ear normal and L ear normal.   Nose:     no external deformity, no external erythema, and no nasal discharge.   Lungs:     normal respiratory effort and no accessory muscle use.   Heart:     normal rate and regular rhythm.   Abdomen:     Bowel sounds positive,abdomen soft and non-tender without  masses, organomegaly or hernias noted. Msk:     No deformity or scoliosis noted of thoracic or lumbar spine.      Impression & Recommendations:  Problem # 1:  HYPERTENSION (ICD-401.9)  His updated medication list for this problem includes:    Benicar 40 Mg Tabs (Olmesartan medoxomil) .Marland Kitchen... Take 1 tablet once a day  BP today: 128/72 Prior BP: 130/80 (06/18/2007)   Problem # 2:  COPD (ICD-496)  Problem # 3:  DEPRESSION (ICD-311)  His updated medication list for this problem includes:    Cymbalta 60 Mg Cpep (Duloxetine hcl) ..... One by mouth daily    Klonopin 1 Mg Tabs  (Clonazepam) ..... One by mouth tid Discussed treatment options, including trial of antidpressant medication. Will refer to behavioral health. Follow-up call in in 24-48 hours and recheck in 2 weeks, sooner as needed. Patient agrees to call if any worsening of symptoms or thoughts of doing harm arise. Verified that the patient has no suicidal ideation at this time.   Complete Medication List: 1)  Benicar 40 Mg Tabs (Olmesartan medoxomil) .... Take 1 tablet once a day 2)  Nasonex 50 Mcg/act Susp (Mometasone furoate) .... Spray 2 spray as directed once a day 3)  Cymbalta 60 Mg Cpep (Duloxetine hcl) .... One by mouth daily 4)  Klonopin 1 Mg Tabs (Clonazepam) .... One by mouth tid  Depression Assessment/Plan: Treatment Comments:  doing well   Patient Instructions: 1)  Please schedule a follow-up appointment in 3 months.    ]

## 2011-01-18 LAB — URINALYSIS, ROUTINE W REFLEX MICROSCOPIC
Bilirubin Urine: NEGATIVE
Glucose, UA: NEGATIVE mg/dL
Hgb urine dipstick: NEGATIVE
Ketones, ur: NEGATIVE mg/dL
Nitrite: NEGATIVE
Protein, ur: NEGATIVE mg/dL
Specific Gravity, Urine: 1.022 (ref 1.005–1.030)
Urobilinogen, UA: 0.2 mg/dL (ref 0.0–1.0)
pH: 5.5 (ref 5.0–8.0)

## 2011-01-18 LAB — POCT I-STAT, CHEM 8
BUN: 14 mg/dL (ref 6–23)
Calcium, Ion: 1.11 mmol/L — ABNORMAL LOW (ref 1.12–1.32)
Chloride: 102 mEq/L (ref 96–112)
Creatinine, Ser: 1.1 mg/dL (ref 0.4–1.5)
Glucose, Bld: 94 mg/dL (ref 70–99)
HCT: 57 % — ABNORMAL HIGH (ref 39.0–52.0)
Hemoglobin: 19.4 g/dL — ABNORMAL HIGH (ref 13.0–17.0)
Potassium: 3.8 mEq/L (ref 3.5–5.1)
Sodium: 139 mEq/L (ref 135–145)
TCO2: 25 mmol/L (ref 0–100)

## 2011-08-26 ENCOUNTER — Emergency Department (HOSPITAL_COMMUNITY): Payer: BC Managed Care – PPO

## 2011-08-26 ENCOUNTER — Encounter: Payer: Self-pay | Admitting: *Deleted

## 2011-08-26 ENCOUNTER — Emergency Department (HOSPITAL_COMMUNITY)
Admission: EM | Admit: 2011-08-26 | Discharge: 2011-08-26 | Disposition: A | Discharge status: Home / Self Care | Payer: BC Managed Care – PPO | Attending: Emergency Medicine | Admitting: Emergency Medicine

## 2011-08-26 DIAGNOSIS — Z9889 Other specified postprocedural states: Secondary | ICD-10-CM | POA: Insufficient documentation

## 2011-08-26 DIAGNOSIS — Z79899 Other long term (current) drug therapy: Secondary | ICD-10-CM | POA: Insufficient documentation

## 2011-08-26 DIAGNOSIS — J069 Acute upper respiratory infection, unspecified: Secondary | ICD-10-CM | POA: Insufficient documentation

## 2011-08-26 DIAGNOSIS — J40 Bronchitis, not specified as acute or chronic: Secondary | ICD-10-CM | POA: Insufficient documentation

## 2011-08-26 DIAGNOSIS — R059 Cough, unspecified: Secondary | ICD-10-CM | POA: Insufficient documentation

## 2011-08-26 DIAGNOSIS — J438 Other emphysema: Secondary | ICD-10-CM | POA: Insufficient documentation

## 2011-08-26 DIAGNOSIS — R05 Cough: Secondary | ICD-10-CM | POA: Insufficient documentation

## 2011-08-26 DIAGNOSIS — F411 Generalized anxiety disorder: Secondary | ICD-10-CM | POA: Insufficient documentation

## 2011-08-26 DIAGNOSIS — R000 Tachycardia, unspecified: Secondary | ICD-10-CM | POA: Insufficient documentation

## 2011-08-26 DIAGNOSIS — I10 Essential (primary) hypertension: Secondary | ICD-10-CM

## 2011-08-26 DIAGNOSIS — R450 Nervousness: Secondary | ICD-10-CM | POA: Insufficient documentation

## 2011-08-26 HISTORY — DX: Tachycardia, unspecified: R00.0

## 2011-08-26 HISTORY — DX: Chronic obstructive pulmonary disease, unspecified: J44.9

## 2011-08-26 HISTORY — DX: Essential (primary) hypertension: I10

## 2011-08-26 HISTORY — DX: Anxiety disorder, unspecified: F41.9

## 2011-08-26 MED ORDER — METOPROLOL SUCCINATE ER 25 MG PO TB24: 50.0000 mg | ORAL_TABLET | Freq: Every day | ORAL | Status: DC

## 2011-08-26 MED ORDER — OLMESARTAN MEDOXOMIL 40 MG PO TABS: 40.0000 mg | ORAL_TABLET | Freq: Every day | ORAL | Status: DC

## 2011-08-26 MED ORDER — CLONAZEPAM 1 MG PO TABS: 1.0000 mg | ORAL_TABLET | Freq: Three times a day (TID) | ORAL | Status: DC | PRN

## 2011-08-26 NOTE — ED Provider Notes (Signed)
History     CSN: [id number] Arrival date & time: 08/26/2011  1:48 PM   First MD Initiated Contact with Patient 08/26/11 1758      Chief Complaint  Patient presents with  . Cough  . URI    (Consider location/radiation/quality/duration/timing/severity/associated sxs/prior treatment) HPI Comments: Patient presents with cough since yesterday.  He notes that he had cough and was diagnosed with bronchitis a few weeks ago by his primary care physician.  He was placed on doxycycline and felt that he improved.  Patient has no fevers.  Because of persistent cough he presents today out of concern for possible pneumonia.  Patient also incidentally notes he has not been on his blood pressure medicine or his metoprolol for his tachycardia at home.  He skips and primary care appointments because he recently stopped smoking 5 weeks ago and quit drinking and felt this might decrease his blood pressure enough on its own.  Patient has no nausea or vomiting.  No abdominal pain.  No shortness of breath or chest pain.  Patient is a 45 y.o. male presenting with cough and URI. The history is provided by the patient. No language interpreter was used.  Cough This is a recurrent problem. Pertinent negatives include no chest pain, no chills, no headaches, no shortness of breath and no eye redness.  URI The primary symptoms include cough. Primary symptoms do not include fever, headaches, abdominal pain, nausea, vomiting or rash.  The illness is not associated with chills.    Past Medical History  Diagnosis Date  . COPD (chronic obstructive pulmonary disease)   . Anxiety   . Emphysema   . Hypertension   . Tachycardia     Past Surgical History  Procedure Date  . Facial cosmetic surgery   . Thoracotomy     No family history on file.  History  Substance Use Topics  . Smoking status: Former Smoker    Quit date: 07/19/2011  . Smokeless tobacco: Not on file  . Alcohol Use: No     recently quit 3 mths  ago      Review of Systems  Constitutional: Negative.  Negative for fever and chills.  HENT: Negative.   Eyes: Negative.  Negative for discharge and redness.  Respiratory: Positive for cough. Negative for shortness of breath.   Cardiovascular: Negative.  Negative for chest pain.  Gastrointestinal: Negative.  Negative for nausea, vomiting and abdominal pain.  Genitourinary: Negative.  Negative for hematuria.  Musculoskeletal: Negative.  Negative for back pain.  Skin: Negative.  Negative for color change and rash.  Neurological: Negative for syncope and headaches.  Hematological: Negative.  Negative for adenopathy.  Psychiatric/Behavioral: Negative for confusion. The patient is nervous/anxious.   All other systems reviewed and are negative.    Allergies  Review of patient's allergies indicates no known allergies.  Home Medications   Current Outpatient Rx  Name Route Sig Dispense Refill  . CLONAZEPAM 1 MG PO TABS Oral Take 1 mg by mouth 3 (three) times daily as needed.      Marland Kitchen METOPROLOL SUCCINATE 50 MG PO TB24 Oral Take 50 mg by mouth daily.      Marland Kitchen OLMESARTAN MEDOXOMIL 40 MG PO TABS Oral Take 40 mg by mouth daily.        BP 155/101  Pulse 105  Temp(Src) 97.7 F (36.5 C) (Oral)  Resp 18  Ht 6\' 1"  (1.854 m)  Wt 225 lb (102.059 kg)  BMI 29.69 kg/m2  SpO2 95%  Physical  Exam  Constitutional: He is oriented to person, place, and time. He appears well-developed and well-nourished.  Non-toxic appearance. He does not have a sickly appearance.  HENT:  Head: Normocephalic and atraumatic.  Eyes: Conjunctivae, EOM and lids are normal. Pupils are equal, round, and reactive to light.  Neck: Trachea normal, normal range of motion and full passive range of motion without pain. Neck supple.  Cardiovascular: Normal rate, regular rhythm and normal heart sounds.   Pulmonary/Chest: Effort normal and breath sounds normal. No respiratory distress. He has no wheezes. He has no rales. He  exhibits no tenderness.  Abdominal: Soft. Normal appearance. He exhibits no distension. There is no tenderness. There is no rebound and no CVA tenderness.  Musculoskeletal: Normal range of motion.  Neurological: He is alert and oriented to person, place, and time. He has normal strength.  Skin: Skin is warm, dry and intact. No rash noted.  Psychiatric: He has a normal mood and affect. His behavior is normal. Judgment and thought content normal.       Patient appears anxious but otherwise appropriate    ED Course  Procedures (including critical care time)  Labs Reviewed - No data to display Dg Chest 2 View  08/26/2011  *RADIOLOGY REPORT*  Clinical Data: Shortness of breath  CHEST - 2 VIEW  Comparison: 11/26/2008  Findings: The heart size is mildly enlarged.  Blunting of the costophrenic angles noted bilaterally, similar to previous exam and likely reflecting pleuroparenchymal scarring.  Diffusely coarsened interstitial markings and bilateral lower lobe scar like opacities are noted, similar to previous exam.  The visualized osseous structures is significant for multilevel spondylosis.  IMPRESSION:  1.  Advanced chronic interstitial lung disease with extensive bilateral lower lobe scarring.  Original Report Authenticated By: Rosealee Albee, M.D.     No diagnosis found.    MDM  Patient with symptoms consistent with viral bronchitis.  He is ready complete his course of doxycycline and briefly improved but now has symptoms of some cough again.  With a clear chest x-ray this is unlikely to be bacterial pneumonia at this time.  Patient's oxygen saturations are normal.  Patient recounts that he has not been on his metoprolol and his Benicar which would explain his hypertension and his tachycardia.  Patient notes he is on metoprolol for tachycardia as well.  I will re prescribe in these medications so that he can restart them for home use.  He's been advised to followup with his primary care  physician as well as his psychiatrist.          Nat Christen, MD 08/26/11 1815

## 2011-08-26 NOTE — ED Notes (Signed)
Patient reports recent bronchitis.  He thought he was getting better but today he woke with coughing and increased sob.  He does have a hx of emphysema.  He has been out of his meds for 2 mths.

## 2012-05-07 ENCOUNTER — Encounter (HOSPITAL_COMMUNITY): Payer: Self-pay | Admitting: Emergency Medicine

## 2012-05-07 ENCOUNTER — Emergency Department (HOSPITAL_COMMUNITY)
Admission: EM | Admit: 2012-05-07 | Discharge: 2012-05-08 | Disposition: A | Discharge status: Home / Self Care | Payer: BC Managed Care – PPO | Attending: Emergency Medicine | Admitting: Emergency Medicine

## 2012-05-07 DIAGNOSIS — I10 Essential (primary) hypertension: Secondary | ICD-10-CM | POA: Insufficient documentation

## 2012-05-07 DIAGNOSIS — F329 Major depressive disorder, single episode, unspecified: Secondary | ICD-10-CM | POA: Insufficient documentation

## 2012-05-07 DIAGNOSIS — F32A Depression, unspecified: Secondary | ICD-10-CM

## 2012-05-07 DIAGNOSIS — D751 Secondary polycythemia: Secondary | ICD-10-CM

## 2012-05-07 DIAGNOSIS — F3289 Other specified depressive episodes: Secondary | ICD-10-CM | POA: Insufficient documentation

## 2012-05-07 DIAGNOSIS — Z79899 Other long term (current) drug therapy: Secondary | ICD-10-CM | POA: Insufficient documentation

## 2012-05-07 DIAGNOSIS — D45 Polycythemia vera: Secondary | ICD-10-CM | POA: Insufficient documentation

## 2012-05-07 DIAGNOSIS — F1010 Alcohol abuse, uncomplicated: Secondary | ICD-10-CM | POA: Insufficient documentation

## 2012-05-07 LAB — COMPREHENSIVE METABOLIC PANEL
ALT: 33 U/L (ref 0–53)
AST: 27 U/L (ref 0–37)
Albumin: 4.3 g/dL (ref 3.5–5.2)
Alkaline Phosphatase: 104 U/L (ref 39–117)
BUN: 3 mg/dL — ABNORMAL LOW (ref 6–23)
CO2: 28 mEq/L (ref 19–32)
Calcium: 9.6 mg/dL (ref 8.4–10.5)
Chloride: 88 mEq/L — ABNORMAL LOW (ref 96–112)
Creatinine, Ser: 0.78 mg/dL (ref 0.50–1.35)
GFR calc Af Amer: >90 mL/min (ref >90)
GFR calc non Af Amer: >90 mL/min (ref >90)
Glucose, Bld: 89 mg/dL (ref 70–99)
Potassium: 3.5 mEq/L (ref 3.5–5.1)
Sodium: 130 mEq/L — ABNORMAL LOW (ref 135–145)
Total Bilirubin: 0.5 mg/dL (ref 0.3–1.2)
Total Protein: 7.4 g/dL (ref 6.0–8.3)

## 2012-05-07 LAB — CBC
HCT: 54 % — ABNORMAL HIGH (ref 39.0–52.0)
Hemoglobin: 19.9 g/dL — ABNORMAL HIGH (ref 13.0–17.0)
MCH: 31.8 pg (ref 26.0–34.0)
MCHC: 36.9 g/dL — ABNORMAL HIGH (ref 30.0–36.0)
MCV: 86.3 fL (ref 78.0–100.0)
Platelets: 189 10*3/uL (ref 150–400)
RBC: 6.26 MIL/uL — ABNORMAL HIGH (ref 4.22–5.81)
RDW: 13 % (ref 11.5–15.5)
WBC: 10 10*3/uL (ref 4.0–10.5)

## 2012-05-07 LAB — RAPID URINE DRUG SCREEN, HOSP PERFORMED
Amphetamines: NOT DETECTED
Barbiturates: NOT DETECTED
Benzodiazepines: NOT DETECTED
Cocaine: NOT DETECTED
Opiates: NOT DETECTED
Tetrahydrocannabinol: NOT DETECTED

## 2012-05-07 LAB — ETHANOL: Alcohol, Ethyl (B): 57 mg/dL — ABNORMAL HIGH (ref 0–11)

## 2012-05-07 MED ORDER — ALBUTEROL SULFATE (5 MG/ML) 0.5% IN NEBU: 2.5000 mg | INHALATION_SOLUTION | RESPIRATORY_TRACT | Status: DC | PRN

## 2012-05-07 MED ORDER — IPRATROPIUM BROMIDE 0.02 % IN SOLN
0.5000 mg | Freq: Once | RESPIRATORY_TRACT | Status: AC
  Administered 2012-05-07: 0.5 mg via RESPIRATORY_TRACT
  Filled 2012-05-07: qty 2.5

## 2012-05-07 MED ORDER — ALUM & MAG HYDROXIDE-SIMETH 200-200-20 MG/5ML PO SUSP: 30.0000 mL | ORAL | Status: DC | PRN

## 2012-05-07 MED ORDER — LISINOPRIL-HYDROCHLOROTHIAZIDE 20-12.5 MG PO TABS: 1.0000 | ORAL_TABLET | Freq: Every day | ORAL | Status: DC

## 2012-05-07 MED ORDER — ALBUTEROL SULFATE (5 MG/ML) 0.5% IN NEBU
2.5000 mg | INHALATION_SOLUTION | Freq: Once | RESPIRATORY_TRACT | Status: AC
  Administered 2012-05-07: 2.5 mg via RESPIRATORY_TRACT
  Filled 2012-05-07: qty 0.5

## 2012-05-07 MED ORDER — ZOLPIDEM TARTRATE 5 MG PO TABS
5.0000 mg | ORAL_TABLET | Freq: Every evening | ORAL | Status: DC | PRN
  Administered 2012-05-08: 5 mg via ORAL
  Filled 2012-05-07: qty 1

## 2012-05-07 MED ORDER — IPRATROPIUM BROMIDE 0.02 % IN SOLN: 0.5000 mg | Freq: Once | RESPIRATORY_TRACT | Status: DC

## 2012-05-07 MED ORDER — CLONAZEPAM 0.5 MG PO TABS
1.0000 mg | ORAL_TABLET | Freq: Three times a day (TID) | ORAL | Status: DC | PRN
  Administered 2012-05-07 – 2012-05-08 (×2): 1 mg via ORAL
  Filled 2012-05-07 (×2): qty 2

## 2012-05-07 MED ORDER — IBUPROFEN 200 MG PO TABS: 600.0000 mg | ORAL_TABLET | Freq: Three times a day (TID) | ORAL | Status: DC | PRN

## 2012-05-07 MED ORDER — SODIUM CHLORIDE 0.9 % IV BOLUS (SEPSIS)
1000.0000 mL | Freq: Once | INTRAVENOUS | Status: AC
  Administered 2012-05-07: 1000 mL via INTRAVENOUS

## 2012-05-07 MED ORDER — LISINOPRIL 20 MG PO TABS
20.0000 mg | ORAL_TABLET | Freq: Every day | ORAL | Status: DC
  Administered 2012-05-07 – 2012-05-08 (×2): 20 mg via ORAL
  Filled 2012-05-07 (×3): qty 1

## 2012-05-07 MED ORDER — ACETAMINOPHEN 325 MG PO TABS: 650.0000 mg | ORAL_TABLET | ORAL | Status: DC | PRN

## 2012-05-07 MED ORDER — METOPROLOL TARTRATE 25 MG PO TABS
50.0000 mg | ORAL_TABLET | Freq: Two times a day (BID) | ORAL | Status: DC
  Administered 2012-05-07 – 2012-05-08 (×2): 50 mg via ORAL
  Filled 2012-05-07 (×2): qty 2

## 2012-05-07 MED ORDER — HYDROCHLOROTHIAZIDE 12.5 MG PO CAPS
12.5000 mg | ORAL_CAPSULE | Freq: Every day | ORAL | Status: DC
  Administered 2012-05-07 – 2012-05-08 (×2): 12.5 mg via ORAL
  Filled 2012-05-07 (×3): qty 1

## 2012-05-07 MED ORDER — LORAZEPAM 1 MG PO TABS: 1.0000 mg | ORAL_TABLET | ORAL | Status: DC | PRN

## 2012-05-07 MED ORDER — ONDANSETRON HCL 4 MG PO TABS: 4.0000 mg | ORAL_TABLET | Freq: Three times a day (TID) | ORAL | Status: DC | PRN

## 2012-05-07 MED ORDER — NICOTINE 21 MG/24HR TD PT24
21.0000 mg | MEDICATED_PATCH | Freq: Every day | TRANSDERMAL | Status: DC
  Administered 2012-05-07: 21 mg via TRANSDERMAL
  Filled 2012-05-07 (×2): qty 1

## 2012-05-07 NOTE — BH Assessment (Signed)
Assessment Note    Patient is a 46 year old white male.  Patient was brought to the ER by his father and sister.  Patient requests detox from alcohol.  Patient reports that he last drank 9 beers today.  Patient's BAL was 57.  Patient denies using any drugs.  Patient UDS was negative for any drugs.  Patient reports first use of alcohol was at age 44yrs old.  Patient reports drinking 12-18 beers daily.  Patient reports having on year of sobriety in 2004 when his lung collapsed. Patient reports being addicted to alcohol since 1996 to present.  He reported trying to go off Klonopin one time and it was a horrible withdrawal experience. He reported going into V-tach during one of his klonopin withdrawals.  Patient reports increased feelings of depression and guilt associated with his past criminal offense. Patient is a registered sex offender.  Patient reports having non-contact inappropriate contact with young males between the ages of 59-80 years old.   Patient was recently seen at [facility] and was in the ED for 2 days and was told that they couldn't find anywhere for him to go because he is a registered sex offender.  Patient reports he had success with a rehab program for sex offenders and was told he would never stop having an attraction to young boys, but he would learn to control those feelings. Patient has a history of trading Psychologist, counselling over the internet of minors.  Patient denies any physical, sexual or emotional abuse.   Patient denies any HI/SI.  Patient states he had a knife in his hand last night and was going to cut himself. Patient reports Patient denies any psychosis.     Axis I: Alcohol Dependence and Depressive Disorder.  Axis II: Deferred Axis III:  Past Medical History  Diagnosis Date  . COPD (chronic obstructive pulmonary disease)   . Anxiety   . Emphysema   . Hypertension   . Tachycardia    Axis IV: economic problems, housing problems, other  psychosocial or environmental problems, problems related to social environment, problems with access to health care services and problems with primary support group Axis V: 41-50 serious symptoms  Past Medical History:  Past Medical History  Diagnosis Date  . COPD (chronic obstructive pulmonary disease)   . Anxiety   . Emphysema   . Hypertension   . Tachycardia     Past Surgical History  Procedure Date  . Facial cosmetic surgery   . Thoracotomy     Family History: History reviewed. No pertinent family history.  Social History:  reports that he quit smoking about 9 months ago. He does not have any smokeless tobacco history on file. He reports that he does not drink alcohol or use illicit drugs.  Additional Social History:  Alcohol / Drug Use History of alcohol / drug use?: Yes Substance #1 Name of Substance 1: Alcohol  1 - Age of First Use: 12 1 - Amount (size/oz): 12-18 beers  1 - Frequency: Daily  1 - Duration: since 1196 1 - Last Use / Amount: Today he drank9 beers   CIWA: CIWA-Ar BP: 102/63 mmHg Pulse Rate: 73  Nausea and Vomiting: mild nausea with no vomiting Tactile Disturbances: none Tremor: no tremor Auditory Disturbances: not present Paroxysmal Sweats: barely perceptible sweating, palms moist Visual Disturbances: not present Anxiety: mildly anxious Headache, Fullness in Head: very mild Agitation: somewhat more than normal activity Orientation and Clouding of Sensorium: oriented and can do serial  additions CIWA-Ar Total: 5  COWS:    Allergies: No Known Allergies  Home Medications:  (Not in a hospital admission)  OB/GYN Status:  No LMP for male patient.  General Assessment Data Location of Assessment: [facility] ED ACT Assessment: Yes Living Arrangements: Parent Can pt return to current living arrangement?: Yes Admission Status: Voluntary Is patient capable of signing voluntary admission?: Yes Transfer from: Home Referral Source:  Self/Family/Friend  Education Status Is patient currently in school?: No  Risk to self Suicidal Ideation: No-Not Currently/Within Last 6 Months Suicidal Intent: No-Not Currently/Within Last 6 Months Is patient at risk for suicide?: No Suicidal Plan?: No-Not Currently/Within Last 6 Months Access to Means: No What has been your use of drugs/alcohol within the last 12 months?: Alcohol Previous Attempts/Gestures: No How many times?: 0  Other Self Harm Risks: 0 Triggers for Past Attempts:  (N/A) Intentional Self Injurious Behavior: None Family Suicide History: No Recent stressful life event(s): Legal Issues;Other (Comment) (Registered sex offender ) Persecutory voices/beliefs?: No Depression: Yes Depression Symptoms: Guilt;Loss of interest in usual pleasures;Feeling worthless/self pity;Despondent Substance abuse history and/or treatment for substance abuse?: Yes Suicide prevention information given to non-admitted patients: Yes  Risk to Others Homicidal Ideation: No Thoughts of Harm to Others: No Current Homicidal Intent: No Current Homicidal Plan: No Access to Homicidal Means: No Identified Victim: none History of harm to others?: No Assessment of Violence: None Noted Violent Behavior Description: none Does patient have access to weapons?: No Criminal Charges Pending?: No Does patient have a court date: No  Psychosis Hallucinations: None noted Delusions: None noted  Mental Status Report Appear/Hygiene: Disheveled;Poor hygiene Eye Contact: Fair Motor Activity: Restlessness Speech: Logical/coherent;Soft;Slow Level of Consciousness: Alert;Restless Mood: Depressed;Anxious;Despair;Guilty;Fearful;Sullen;Worthless, low self-esteem Affect: Anxious;Blunted;Depressed;Preoccupied Anxiety Level: Minimal Thought Processes: Coherent;Relevant Judgement: Unimpaired Orientation: Person;Place;Time;Situation Obsessive Compulsive Thoughts/Behaviors: None  Cognitive  Functioning Concentration: Decreased Memory: Recent Intact;Remote Intact IQ: Average Insight: Poor Impulse Control: Poor Appetite: Poor Weight Loss: 0  Weight Gain: 0  Sleep: Decreased Total Hours of Sleep: 5  Vegetative Symptoms: Decreased grooming  ADLScreening [facility] Assessment Services) Patient's cognitive ability adequate to safely complete daily activities?: Yes Patient able to express need for assistance with ADLs?: Yes Independently performs ADLs?: Yes  Abuse/Neglect [facility]) Physical Abuse: Denies Verbal Abuse: Denies Sexual Abuse: Denies  Prior Inpatient Therapy Prior Inpatient Therapy: No  Prior Outpatient Therapy Prior Outpatient Therapy: Yes Prior Therapy Dates: 2007-2010 Prior Therapy Facilty/Provider(s): [facility] in [place]  Reason for Treatment: sexual offender rehab  ADL Screening (condition at time of admission) Patient's cognitive ability adequate to safely complete daily activities?: Yes Patient able to express need for assistance with ADLs?: Yes Independently performs ADLs?: Yes       Abuse/Neglect Assessment (Assessment to be complete while patient is alone) Physical Abuse: Denies Verbal Abuse: Denies Sexual Abuse: Denies Exploitation of patient/patient's resources: Denies Self-Neglect: Denies Values / Beliefs Cultural Requests During Hospitalization: None Spiritual Requests During Hospitalization: None Consults Spiritual Care Consult Needed: No Social Work Consult Needed: No      Additional Information 1:1 In Past 12 Months?: No CIRT Risk: No Elopement Risk: No Does patient have medical clearance?: Yes     Disposition: Pending [facility].  Disposition Disposition of Patient: Referred to [facility] and [facility] ) Patient referred to: Other (Comment) ([facility] )  On Site Evaluation by:   Reviewed with Physician:     Phillip Heal LaVerne 05/07/2012 10:08 PM

## 2012-05-07 NOTE — ED Notes (Signed)
Pt changed into blue scrubs, wanded by security, one personal belongings bag at triage desk.

## 2012-05-07 NOTE — ED Notes (Signed)
Dr. Preston Fleeting notified of patient's cough.  Respiratory treatments ordered.

## 2012-05-07 NOTE — ED Notes (Signed)
[id number]> Expected date:<BR> Expected time:<BR> Means of arrival:<BR> Comments:<BR>

## 2012-05-07 NOTE — ED Provider Notes (Signed)
History     CSN: [id number]  Arrival date & time 05/07/12  1327   First MD Initiated Contact with Patient 05/07/12 1506      Chief Complaint  Patient presents with  . Medical Clearance    (Consider location/radiation/quality/duration/timing/severity/associated sxs/prior treatment) The history is provided by the patient.   46 year old male presents for detox from alcohol. He drinks 12 beers a day and 18 is a day on weekends. Last drink was this morning. He states that he hadn't tried to go through detox last November but, after 2 days in an emergency department, he was told that he could not be placed and was discharged. Evidently, place and is difficult because she is a registered sex offender. He does admit to depression and last night he had suicidal thoughts and took a knife and put it to his chest but never followed through with the attempt. He has had crying spells but has not had early morning awakening. He denies hallucinations. He states he is never gone through DTs and never had an alcohol withdrawal seizure.  Past Medical History  Diagnosis Date  . COPD (chronic obstructive pulmonary disease)   . Anxiety   . Emphysema   . Hypertension   . Tachycardia     Past Surgical History  Procedure Date  . Facial cosmetic surgery   . Thoracotomy     History reviewed. No pertinent family history.  History  Substance Use Topics  . Smoking status: Former Smoker    Quit date: 07/19/2011  . Smokeless tobacco: Not on file  . Alcohol Use: No     recently quit 3 mths ago      Review of Systems  All other systems reviewed and are negative.    Allergies  Review of patient's allergies indicates no known allergies.  Home Medications   Current Outpatient Rx  Name Route Sig Dispense Refill  . CLONAZEPAM 1 MG PO TABS Oral Take 1 mg by mouth 2 (two) times daily as needed.     Marland Kitchen LISINOPRIL-HYDROCHLOROTHIAZIDE 20-12.5 MG PO TABS Oral Take 1 tablet by mouth daily.    Marland Kitchen  METOPROLOL TARTRATE 50 MG PO TABS Oral Take 50 mg by mouth 2 (two) times daily.    Marland Kitchen CLONAZEPAM 1 MG PO TABS Oral Take 1 tablet (1 mg total) by mouth 3 (three) times daily as needed for anxiety. 12 tablet 0    BP 124/76  Pulse 67  Temp 98.1 F (36.7 C) (Oral)  Resp 18  Ht 6\' 1"  (1.854 m)  Wt 218 lb (98.884 kg)  BMI 28.76 kg/m2  SpO2 98%  Physical Exam  Nursing note and vitals reviewed. 46 year old male, resting comfortably and in no acute distress. Vital signs are normal. Oxygen saturation is 98%, which is normal. Head is normocephalic and atraumatic. PERRLA, EOMI. Oropharynx is clear. Neck is nontender and supple without adenopathy or JVD. Back is nontender and there is no CVA tenderness. Lungs are clear without rales, wheezes, or rhonchi. Chest is nontender. Heart has regular rate and rhythm without murmur. Abdomen is soft, flat, nontender without masses or hepatosplenomegaly and peristalsis is normoactive. Extremities have no cyanosis or edema, full range of motion is present. Skin is warm and dry without rash. Neurologic: Mental status is normal, cranial nerves are intact, there are no motor or sensory deficits.   ED Course  Procedures (including critical care time)  Results for orders placed during the hospital encounter of 05/07/12  CBC  Component Value Range   WBC 10.0  4.0 - 10.5 K/uL   RBC 6.26 (*) 4.22 - 5.81 MIL/uL   Hemoglobin 19.9 (*) 13.0 - 17.0 g/dL   HCT 57.8 (*) 46.9 - 62.9 %   MCV 86.3  78.0 - 100.0 fL   MCH 31.8  26.0 - 34.0 pg   MCHC 36.9 (*) 30.0 - 36.0 g/dL   RDW 52.8  41.3 - 24.4 %   Platelets 189  150 - 400 K/uL  COMPREHENSIVE METABOLIC PANEL      Component Value Range   Sodium 130 (*) 135 - 145 mEq/L   Potassium 3.5  3.5 - 5.1 mEq/L   Chloride 88 (*) 96 - 112 mEq/L   CO2 28  19 - 32 mEq/L   Glucose, Bld 89  70 - 99 mg/dL   BUN 3 (*) 6 - 23 mg/dL   Creatinine, Ser 0.10  0.50 - 1.35 mg/dL   Calcium 9.6  8.4 - 27.2 mg/dL   Total Protein  7.4  6.0 - 8.3 g/dL   Albumin 4.3  3.5 - 5.2 g/dL   AST 27  0 - 37 U/L   ALT 33  0 - 53 U/L   Alkaline Phosphatase 104  39 - 117 U/L   Total Bilirubin 0.5  0.3 - 1.2 mg/dL   GFR calc non Af Amer >90  >90 mL/min   GFR calc Af Amer >90  >90 mL/min  ETHANOL      Component Value Range   Alcohol, Ethyl (B) 57 (*) 0 - 11 mg/dL  URINE RAPID DRUG SCREEN (HOSP PERFORMED)      Component Value Range   Opiates NONE DETECTED  NONE DETECTED   Cocaine NONE DETECTED  NONE DETECTED   Benzodiazepines NONE DETECTED  NONE DETECTED   Amphetamines NONE DETECTED  NONE DETECTED   Tetrahydrocannabinol NONE DETECTED  NONE DETECTED   Barbiturates NONE DETECTED  NONE DETECTED  BASIC METABOLIC PANEL      Component Value Range   Sodium 135  135 - 145 mEq/L   Potassium 3.7  3.5 - 5.1 mEq/L   Chloride 97  96 - 112 mEq/L   CO2 29  19 - 32 mEq/L   Glucose, Bld 101 (*) 70 - 99 mg/dL   BUN 6  6 - 23 mg/dL   Creatinine, Ser 5.36  0.50 - 1.35 mg/dL   Calcium 9.1  8.4 - 64.4 mg/dL   GFR calc non Af Amer >90  >90 mL/min   GFR calc Af Amer >90  >90 mL/min    1. Alcohol abuse   2. Depression   3. Polycythemia       MDM  Alcohol abuse and depression with suicidal ideation. Will be placed in a psychiatric emergency department and consultation will be obtained with ACT Team to arrange appropriate placement. Hyponatremia is noted and he will be given a liter of normal saline.        Dione Booze, MD 05/08/12 1500

## 2012-05-07 NOTE — ED Notes (Signed)
Pt frustrated that he is unable to stop drinking and smoking.  Pt reports he has emphysema and should not be smoking.  Reports he had success with a rehab program for sex offenders and was told he would never stop having an attraction to young boys, but he would learn to control those feelings.  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  He reports surprise that a background check was not done prior to him getting that job.  Pt is unmarried and reports being homosexual.  He has infrequent encounters with same sex partners and uses protection to keep from getting HIV.  He reports it is difficult meeting people now because of his general health condition and addictions.  He expresses having had low self esteem his whole life stemming from having been born with a cleft palate and spending most summers having plastic surgeries to correct that birth defect.  He reports having a lot of guilt over his attraction to young boys.  Pt would like to get treatment for his depression and alcohol dependence.  He reported trying to go off Klonopin one time and it was a horrible withdrawal experience.  He reported going into V-tach during one of his klonopin withdrawals.

## 2012-05-07 NOTE — ED Notes (Signed)
Pt coughing and gagging.  Pt reports he has a history of emphysema but does not have any inhalers at home.

## 2012-05-07 NOTE — ED Notes (Signed)
Pt states he wants to be admitted for alcohol abuse and suicidal ideations. Pt states he had a knife in his hand last night and was going to cut himself.  Pt denies HI.  Pt states he was seen recently at [facility] and was in the ED for 2 days and was told that they couldn't find anywhere for him to go because he is a registered sex offender.  Pt states he wants help and has no plans to hurt himself or anyone else right now.  Pt calm and cooperative, family at bedside.

## 2012-05-08 ENCOUNTER — Telehealth (HOSPITAL_COMMUNITY): Payer: Self-pay | Admitting: Licensed Clinical Social Worker

## 2012-05-08 ENCOUNTER — Inpatient Hospital Stay (HOSPITAL_COMMUNITY): Admission: EM | Admit: 2012-05-08 | Discharge: 2012-05-08 | Payer: Self-pay | Source: Home / Self Care

## 2012-05-08 DIAGNOSIS — F1010 Alcohol abuse, uncomplicated: Secondary | ICD-10-CM

## 2012-05-08 DIAGNOSIS — F1994 Other psychoactive substance use, unspecified with psychoactive substance-induced mood disorder: Secondary | ICD-10-CM

## 2012-05-08 LAB — BASIC METABOLIC PANEL
BUN: 6 mg/dL (ref 6–23)
CO2: 29 mEq/L (ref 19–32)
Calcium: 9.1 mg/dL (ref 8.4–10.5)
Chloride: 97 mEq/L (ref 96–112)
Creatinine, Ser: 0.79 mg/dL (ref 0.50–1.35)
GFR calc Af Amer: >90 mL/min (ref >90)
GFR calc non Af Amer: >90 mL/min (ref >90)
Glucose, Bld: 101 mg/dL — ABNORMAL HIGH (ref 70–99)
Potassium: 3.7 mEq/L (ref 3.5–5.1)
Sodium: 135 mEq/L (ref 135–145)

## 2012-05-08 NOTE — ED Provider Notes (Signed)
He is been evaluated by psychiatry who recommends discharge. Outpatient resources will be given to him.  Dione Booze, MD 05/08/12 [phone]

## 2012-05-08 NOTE — ED Notes (Signed)
Pt making second call for the day.

## 2012-05-08 NOTE — Consult Note (Signed)
Reason for Consult: Depression, and alcohol intoxication Referring Physician: Dr. Pricilla Griffin is an 46 y.o. male.  HPI: Patient was seen and chart reviewed. Patient was brought in by his mother, father, and sister to the [facility] long emergency department requesting treatment for alcohol dependence. Patient reported he has been drinking 12-18 beats a day and having the difficulty functioning at work. He denies blackouts, alcohol withdrawal shakes withdrawal seizures and the DWIs. Patient reportedly living by himself in [place] and working for the last few years. Patient reported the he has been gaining all his life does not have a partner, but has a history of resisted a sex offender since the 2007. His gait assisted with the past criminal offense. Patient reportedly having a non-contact and appropriate. Contact with a young males between the ages of 2 and 48 years old. Room Internet. Patient was recently seen at [facility] will hospital emergency department ways stayed for 2 days without placement issue. He has completed treatment for sex offenders program at [facility] and has come her treatment successfully in 2010. He does not have a reoffending charges or behavior. Patient endorses feeling down, depressed, sad, alone, but no loss of functionality suicidal ideation, homicidal ideation, or psychotic symptoms. Reportedly, he was suffered with the hypertension, and emphysema. He smokes one and half packs per day daily as a history of for lung collapsing 2004. Patient reported he has been lying to his parent's and sister about history of substance abuse treatment. His parents found him non-compliance with his the treatment when he quit working at work where he was been for several years. Patient blood alcohol level is 54 on arrival. His urine drug screen was negative for drug of abuse and dependence.  Patient agreed to obtain outpatient substance abuse and depression treatment especially  counseling.  Past Medical History  Diagnosis Date  . COPD (chronic obstructive pulmonary disease)   . Anxiety   . Emphysema   . Hypertension   . Tachycardia     Past Surgical History  Procedure Date  . Facial cosmetic surgery   . Thoracotomy     History reviewed. No pertinent family history.  Social History:  reports that he quit smoking about 9 months ago. He does not have any smokeless tobacco history on file. He reports that he does not drink alcohol or use illicit drugs.  Allergies: No Known Allergies  Medications: I have reviewed the patient's current medications.  Results for orders placed during the hospital encounter of 05/07/12 (from the past 48 hour(s))  CBC     Status: Abnormal   Collection Time   05/07/12  1:40 PM      Component Value Range Comment   WBC 10.0  4.0 - 10.5 K/uL    RBC 6.26 (*) 4.22 - 5.81 MIL/uL    Hemoglobin 19.9 (*) 13.0 - 17.0 g/dL    HCT 16.1 (*) 09.6 - 52.0 %    MCV 86.3  78.0 - 100.0 fL    MCH 31.8  26.0 - 34.0 pg    MCHC 36.9 (*) 30.0 - 36.0 g/dL    RDW 04.5  40.9 - 81.1 %    Platelets 189  150 - 400 K/uL   COMPREHENSIVE METABOLIC PANEL     Status: Abnormal   Collection Time   05/07/12  1:40 PM      Component Value Range Comment   Sodium 130 (*) 135 - 145 mEq/L    Potassium 3.5  3.5 -  5.1 mEq/L    Chloride 88 (*) 96 - 112 mEq/L    CO2 28  19 - 32 mEq/L    Glucose, Bld 89  70 - 99 mg/dL    BUN 3 (*) 6 - 23 mg/dL    Creatinine, Ser 6.21  0.50 - 1.35 mg/dL    Calcium 9.6  8.4 - 30.8 mg/dL    Total Protein 7.4  6.0 - 8.3 g/dL    Albumin 4.3  3.5 - 5.2 g/dL    AST 27  0 - 37 U/L    ALT 33  0 - 53 U/L    Alkaline Phosphatase 104  39 - 117 U/L    Total Bilirubin 0.5  0.3 - 1.2 mg/dL    GFR calc non Af Amer >90  >90 mL/min    GFR calc Af Amer >90  >90 mL/min   ETHANOL     Status: Abnormal   Collection Time   05/07/12  1:40 PM      Component Value Range Comment   Alcohol, Ethyl (B) 57 (*) 0 - 11 mg/dL   URINE RAPID DRUG SCREEN (HOSP  PERFORMED)     Status: Normal   Collection Time   05/07/12  2:15 PM      Component Value Range Comment   Opiates NONE DETECTED  NONE DETECTED    Cocaine NONE DETECTED  NONE DETECTED    Benzodiazepines NONE DETECTED  NONE DETECTED    Amphetamines NONE DETECTED  NONE DETECTED    Tetrahydrocannabinol NONE DETECTED  NONE DETECTED    Barbiturates NONE DETECTED  NONE DETECTED   BASIC METABOLIC PANEL     Status: Abnormal   Collection Time   05/08/12  1:39 AM      Component Value Range Comment   Sodium 135  135 - 145 mEq/L    Potassium 3.7  3.5 - 5.1 mEq/L    Chloride 97  96 - 112 mEq/L DELTA CHECK NOTED   CO2 29  19 - 32 mEq/L    Glucose, Bld 101 (*) 70 - 99 mg/dL    BUN 6  6 - 23 mg/dL    Creatinine, Ser 6.57  0.50 - 1.35 mg/dL    Calcium 9.1  8.4 - 84.6 mg/dL    GFR calc non Af Amer >90  >90 mL/min    GFR calc Af Amer >90  >90 mL/min     No results found.  No psychosis and Positive for anxiety, bad mood, depression, excessive alcohol consumption, sleep disturbance and tobacco use Blood pressure 105/64, pulse 57, temperature 98 F (36.7 C), temperature source Oral, resp. rate 18, height 6\' 1"  (1.854 m), weight 218 lb (98.884 kg), SpO2 97.00%.   Assessment/Plan: Substance-induced mood disorder Alcohol intoxication, without alcohol withdrawal symptoms.  Recommended outpatient psychiatric services for substance abuse counseling and individual therapy for her depression. Patient also benefit from the alcoholic anonymous meeting participation. Patient does not meet criteria for acute psychiatric hospitalization.  Ray Griffin,Ray Griffin. 05/08/2012, 5:42 PM

## 2012-05-08 NOTE — ED Notes (Signed)
Staff from [facility] called and informed RN that MD denied pt. ACT team informed will continue to monitor.

## 2012-05-08 NOTE — Progress Notes (Signed)
Pt interested in having sitter d/c since he is no longer have SIs. Consulted with Dr. Shela Commons of psychiatry and he agrees that it is appropriate for pt to be without sitter. Charge nurse notified and orders written.

## 2012-05-08 NOTE — ED Notes (Signed)
Called MD to ask if pt could go over to psych ED. MD stated to get another bmp on pt to check sodium prior to sending back to psych ED.

## 2013-06-02 ENCOUNTER — Emergency Department (HOSPITAL_COMMUNITY)
Admission: EM | Admit: 2013-06-02 | Discharge: 2013-06-02 | Disposition: A | Discharge status: Home / Self Care | Payer: Self-pay | Attending: Emergency Medicine | Admitting: Emergency Medicine

## 2013-06-02 ENCOUNTER — Emergency Department (HOSPITAL_COMMUNITY): Payer: Self-pay

## 2013-06-02 ENCOUNTER — Encounter (HOSPITAL_COMMUNITY): Payer: Self-pay

## 2013-06-02 DIAGNOSIS — Y929 Unspecified place or not applicable: Secondary | ICD-10-CM | POA: Insufficient documentation

## 2013-06-02 DIAGNOSIS — Z8659 Personal history of other mental and behavioral disorders: Secondary | ICD-10-CM | POA: Insufficient documentation

## 2013-06-02 DIAGNOSIS — M25572 Pain in left ankle and joints of left foot: Secondary | ICD-10-CM

## 2013-06-02 DIAGNOSIS — S8990XA Unspecified injury of unspecified lower leg, initial encounter: Secondary | ICD-10-CM | POA: Insufficient documentation

## 2013-06-02 DIAGNOSIS — J4489 Other specified chronic obstructive pulmonary disease: Secondary | ICD-10-CM | POA: Insufficient documentation

## 2013-06-02 DIAGNOSIS — Z87891 Personal history of nicotine dependence: Secondary | ICD-10-CM | POA: Insufficient documentation

## 2013-06-02 DIAGNOSIS — IMO0002 Reserved for concepts with insufficient information to code with codable children: Secondary | ICD-10-CM | POA: Insufficient documentation

## 2013-06-02 DIAGNOSIS — J449 Chronic obstructive pulmonary disease, unspecified: Secondary | ICD-10-CM | POA: Insufficient documentation

## 2013-06-02 DIAGNOSIS — Y939 Activity, unspecified: Secondary | ICD-10-CM | POA: Insufficient documentation

## 2013-06-02 DIAGNOSIS — S99922A Unspecified injury of left foot, initial encounter: Secondary | ICD-10-CM

## 2013-06-02 DIAGNOSIS — I10 Essential (primary) hypertension: Secondary | ICD-10-CM | POA: Insufficient documentation

## 2013-06-02 NOTE — ED Provider Notes (Signed)
  Medical screening examination/treatment/procedure(s) were performed by non-physician practitioner and as supervising physician I was immediately available for consultation/collaboration.    Sheli Dorin, MD 06/02/13 1617 

## 2013-06-02 NOTE — ED Notes (Signed)
Patient reports that he hit his left great toe on a pallet jack 5 days ago and has had increasing pain and swelling since.

## 2013-06-02 NOTE — ED Provider Notes (Signed)
CSN: [id number]     Arrival date & time 06/02/13  1052 History   First MD Initiated Contact with Patient 06/02/13 1059     Chief Complaint  Patient presents with  . Toe Injury  . Foot Injury   (Consider location/radiation/quality/duration/timing/severity/associated sxs/prior Treatment) HPI Comments: 47 year old male presents to the emergency department complaining of pain in his left great toe and left ankle x5 days after hitting his toe on a pallet jack. Since the injury, he has noted increasing swelling to his ankle and great toe with some bruising under his toenail. Pain worse with walking, rated 7/10. He has tried taking Tylenol without relief. Denies numbness or tingling.  Patient is a 47 y.o. male presenting with foot injury. The history is provided by the patient.  Foot Injury   Past Medical History  Diagnosis Date  . COPD (chronic obstructive pulmonary disease)   . Anxiety   . Emphysema   . Hypertension   . Tachycardia    Past Surgical History  Procedure Laterality Date  . Facial cosmetic surgery    . Thoracotomy     Family History  Problem Relation Age of Onset  . Diabetes Mother   . Heart failure Mother   . Diabetes Father   . Heart failure Father   . Bipolar disorder Brother    History  Substance Use Topics  . Smoking status: Former Smoker    Quit date: 07/19/2011  . Smokeless tobacco: Never Used  . Alcohol Use: No     Comment: recently quit 3 mths ago    Review of Systems  Musculoskeletal: Positive for joint swelling.       Positive for left great toe and ankle pain.  Skin: Positive for color change.  All other systems reviewed and are negative.    Allergies  Review of patient's allergies indicates no known allergies.  Home Medications   Current Outpatient Rx  Name  Route  Sig  Dispense  Refill  . acetaminophen (TYLENOL) 500 MG tablet   Oral   Take 500 mg by mouth every 6 (six) hours as needed for pain.          BP 148/95  Pulse 94   Temp(Src) 98.2 F (36.8 C) (Oral)  Resp 16  Ht 6\' 1"  (1.854 m)  Wt 240 lb (108.863 kg)  BMI 31.67 kg/m2  SpO2 99% Physical Exam  Nursing note and vitals reviewed. Constitutional: He is oriented to person, place, and time. He appears well-developed and well-nourished. No distress.  HENT:  Head: Normocephalic and atraumatic.  Mouth/Throat: Oropharynx is clear and moist.  Eyes: Conjunctivae are normal.  Neck: Normal range of motion. Neck supple.  Cardiovascular: Normal rate, regular rhythm and normal heart sounds.   Capillary refill less than 3 seconds. DP and PT pulses on left +2.  Pulmonary/Chest: Effort normal and breath sounds normal.  Musculoskeletal:  Tender to palpation of medial and lateral aspect of left ankle. Mild edema noted. No bruising. Decreased range of motion limited by pain. Tender to palpation of proximal left great toe. Mild bruising to that area along with under toenail. Pain with range of motion. No deformity.  Neurological: He is alert and oriented to person, place, and time.  No sensory deficit  Skin: Skin is warm, dry and intact. He is not diaphoretic.  Psychiatric: He has a normal mood and affect. His behavior is normal.    ED Course  Procedures (including critical care time) Labs Review Labs Reviewed - No data  to display Imaging Review Dg Ankle Complete Left  06/02/2013   *RADIOLOGY REPORT*  Clinical Data: Ankle pain  LEFT ANKLE COMPLETE - 3+ VIEW  Comparison: None.  Findings: No acute fracture or dislocation is noted.  No gross soft tissue abnormality is seen.  IMPRESSION: No acute abnormality noted.   Original Report Authenticated By: Alcide Clever, M.D.   Dg Toe Great Left  06/02/2013   *RADIOLOGY REPORT*  Clinical Data: Recent traumatic injury with pain  LEFT GREAT TOE  Comparison: None.  Findings: No acute fracture or dislocation is noted.  No gross soft tissue abnormality is seen.  IMPRESSION: No acute abnormality noted.   Original Report Authenticated  By: Alcide Clever, M.D.    MDM   1. Toe injury, left, initial encounter   2. Ankle pain, left    Patient was to ankle pain after injury. 3:15 in no acute abnormality seen. Advised rest, ice, elevation and ibuprofen. Resource guide given for PCP followup. Return precautions discussed. Patient states understanding of plan and is agreeable.    Trevor Mace, PA-C 06/02/13 1154

## 2014-07-23 ENCOUNTER — Encounter (HOSPITAL_COMMUNITY): Payer: Self-pay | Admitting: Emergency Medicine

## 2014-07-23 ENCOUNTER — Emergency Department (HOSPITAL_COMMUNITY): Payer: BC Managed Care – PPO

## 2014-07-23 ENCOUNTER — Emergency Department (HOSPITAL_COMMUNITY)
Admission: EM | Admit: 2014-07-23 | Discharge: 2014-07-23 | Disposition: A | Discharge status: Home / Self Care | Payer: BC Managed Care – PPO | Attending: Emergency Medicine | Admitting: Emergency Medicine

## 2014-07-23 DIAGNOSIS — Z79899 Other long term (current) drug therapy: Secondary | ICD-10-CM | POA: Insufficient documentation

## 2014-07-23 DIAGNOSIS — J440 Chronic obstructive pulmonary disease with acute lower respiratory infection: Secondary | ICD-10-CM

## 2014-07-23 DIAGNOSIS — F419 Anxiety disorder, unspecified: Secondary | ICD-10-CM | POA: Insufficient documentation

## 2014-07-23 DIAGNOSIS — J441 Chronic obstructive pulmonary disease with (acute) exacerbation: Secondary | ICD-10-CM | POA: Insufficient documentation

## 2014-07-23 DIAGNOSIS — Z87891 Personal history of nicotine dependence: Secondary | ICD-10-CM | POA: Insufficient documentation

## 2014-07-23 DIAGNOSIS — J209 Acute bronchitis, unspecified: Secondary | ICD-10-CM

## 2014-07-23 DIAGNOSIS — I10 Essential (primary) hypertension: Secondary | ICD-10-CM | POA: Insufficient documentation

## 2014-07-23 MED ORDER — SULFAMETHOXAZOLE-TMP DS 800-160 MG PO TABS
1.0000 | ORAL_TABLET | Freq: Once | ORAL | Status: AC
  Administered 2014-07-23: 1 via ORAL
  Filled 2014-07-23: qty 1

## 2014-07-23 MED ORDER — ALBUTEROL SULFATE HFA 108 (90 BASE) MCG/ACT IN AERS: 1.0000 | INHALATION_SPRAY | Freq: Four times a day (QID) | RESPIRATORY_TRACT | Status: DC | PRN

## 2014-07-23 MED ORDER — PREDNISONE 20 MG PO TABS: ORAL_TABLET | ORAL | Status: DC

## 2014-07-23 MED ORDER — AZITHROMYCIN 250 MG PO TABS: 500.0000 mg | ORAL_TABLET | Freq: Once | ORAL | Status: DC

## 2014-07-23 MED ORDER — SULFAMETHOXAZOLE-TRIMETHOPRIM 800-160 MG PO TABS: 1.0000 | ORAL_TABLET | Freq: Two times a day (BID) | ORAL | Status: DC

## 2014-07-23 MED ORDER — ALBUTEROL SULFATE HFA 108 (90 BASE) MCG/ACT IN AERS
2.0000 | INHALATION_SPRAY | Freq: Once | RESPIRATORY_TRACT | Status: AC
  Administered 2014-07-23: 2 via RESPIRATORY_TRACT
  Filled 2014-07-23: qty 6.7

## 2014-07-23 MED ORDER — IPRATROPIUM-ALBUTEROL 0.5-2.5 (3) MG/3ML IN SOLN
3.0000 mL | RESPIRATORY_TRACT | Status: DC
  Administered 2014-07-23: 3 mL via RESPIRATORY_TRACT

## 2014-07-23 MED ORDER — PREDNISONE 20 MG PO TABS
60.0000 mg | ORAL_TABLET | Freq: Once | ORAL | Status: AC
  Administered 2014-07-23: 60 mg via ORAL
  Filled 2014-07-23: qty 3

## 2014-07-23 NOTE — ED Provider Notes (Signed)
CSN: [id number]     Arrival date & time 07/23/14  6644 History   First MD Initiated Contact with Patient 07/23/14 0945     Chief Complaint  Patient presents with  . Cough  . Shortness of Breath     (Consider location/radiation/quality/duration/timing/severity/associated sxs/prior Treatment) HPI The patient poor she's had almost a month of not feeling well. He started with some cold symptoms about a month ago and they have just been lingering on. He reports over the past week however he is becoming increasingly short of breath. He reports he coughs frequently and the mucus is usually well whitish yellow. He reports his symptoms are made worse by exertion. He denies chest pain except for with cough. He is not having a lower extremity swelling or pain in the calves. The patient is a pack-a-day smoker. He does not have any inhalers to use. He reports he does have known emphysema. Past Medical History  Diagnosis Date  . COPD (chronic obstructive pulmonary disease)   . Anxiety   . Emphysema   . Hypertension   . Tachycardia    Past Surgical History  Procedure Laterality Date  . Facial cosmetic surgery    . Thoracotomy     Family History  Problem Relation Age of Onset  . Diabetes Mother   . Heart failure Mother   . Diabetes Father   . Heart failure Father   . Bipolar disorder Brother    History  Substance Use Topics  . Smoking status: Former Smoker    Quit date: 07/19/2011  . Smokeless tobacco: Never Used  . Alcohol Use: No     Comment: recently quit 3 mths ago    Review of Systems 10 Systems reviewed and are negative for acute change except as noted in the HPI.    Allergies  Review of patient's allergies indicates no known allergies.  Home Medications   Prior to Admission medications   Medication Sig Start Date End Date Taking? Authorizing Provider  buPROPion (WELLBUTRIN XL) 150 MG 24 hr tablet Take 150 mg by mouth daily.   Yes Historical Provider, MD   hydrochlorothiazide (HYDRODIURIL) 25 MG tablet Take 12.5 mg by mouth daily.   Yes Historical Provider, MD  Phenyleph-CPM-DM-APAP (ALKA-SELTZER PLUS COLD & COUGH) 02-01-09-325 MG CAPS Take 2 capsules by mouth daily as needed (for cold).   Yes Historical Provider, MD  QUEtiapine (SEROQUEL) 50 MG tablet Take 50 mg by mouth 4 (four) times daily.   Yes Historical Provider, MD  albuterol (PROVENTIL HFA;VENTOLIN HFA) 108 (90 BASE) MCG/ACT inhaler Inhale 1-2 puffs into the lungs every 6 (six) hours as needed for wheezing or shortness of breath. 07/23/14   Charlesetta Shanks, MD  predniSONE (DELTASONE) 20 MG tablet 3 tabs po day one, then 2 tabs daily x 4 days 07/23/14   Charlesetta Shanks, MD  sulfamethoxazole-trimethoprim (SEPTRA DS) 800-160 MG per tablet Take 1 tablet by mouth 2 (two) times daily. 07/23/14   Charlesetta Shanks, MD   BP 125/85  Pulse 59  Temp(Src) 97.9 F (36.6 C) (Oral)  Resp 17  SpO2 97% Physical Exam  Constitutional: He is oriented to person, place, and time. He appears well-developed and well-nourished.  HENT:  Head: Normocephalic and atraumatic.  Eyes: EOM are normal. Pupils are equal, round, and reactive to light.  Neck: Neck supple.  Cardiovascular: Normal rate, regular rhythm, normal heart sounds and intact distal pulses.   Pulmonary/Chest: Effort normal. No respiratory distress. He has wheezes.  The patient does not have  any acute respiratory distress. He does have intermittent wet sounding cough. He has expiratory wheeze coarse in nature in bilateral lung fields. Occasional rhonchi clear with cough.  Abdominal: Soft. Bowel sounds are normal. He exhibits no distension. There is no tenderness.  Musculoskeletal: Normal range of motion. He exhibits no edema and no tenderness.  Neurological: He is alert and oriented to person, place, and time. He has normal strength. Coordination normal. GCS eye subscore is 4. GCS verbal subscore is 5. GCS motor subscore is 6.  Skin: Skin is warm, dry and  intact.  Psychiatric: He has a normal mood and affect.     ED Course  Procedures (including critical care time) Labs Review Labs Reviewed - No data to display  Imaging Review Dg Chest 2 View  07/23/2014   CLINICAL DATA:  Cough, congestion, shortness of breath, and weakness for the last 3 days. Prior right-sided thoracotomy 8 years ago.  EXAM: CHEST  2 VIEW  COMPARISON:  08/26/2011  FINDINGS: Cardiac silhouette is upper limits of normal to mildly enlarged in size, unchanged. Postoperative changes are again seen in the right lung with evidence of right lung volume loss and basilar scarring, not significantly changed. Coarse interstitial densities in the left lower lung also do not appear significantly changed and may reflect additional areas of scarring. There is no evidence of acute airspace consolidation, overt pulmonary edema, definite pleural effusion, or pneumothorax. No acute osseous abnormality is identified.  IMPRESSION: Stable appearance of the chest without evidence of acute airspace disease.   Electronically Signed   By: Logan Bores   On: 07/23/2014 10:42     EKG Interpretation None      MDM   Final diagnoses:  COPD (chronic obstructive pulmonary disease) with acute bronchitis   Chest x-ray does not reveal any focal infiltrate. At this time findings are most consistent with COPD exacerbation. Patient is not hypoxic and does not have any acute respiratory distress. There is no associated chest pain and the symptoms started with URI type of findings. At this time I do feel he is safe for outpatient management with instructions to return if there should be any worsening or changing in his condition.    Charlesetta Shanks, MD 07/23/14 [phone]

## 2014-07-23 NOTE — ED Notes (Signed)
Educated on ventolin HFA

## 2014-07-23 NOTE — ED Notes (Signed)
Pt w/ hx of COPD/emphysema.  States that he has had a cold for a week.  C/o SOB w/ productive cough (white/yellow mucous).  C/o CP also.

## 2014-07-23 NOTE — ED Notes (Signed)
Respiratory notified of need for breathing treatment; pt ambulated to bathroom without assistance; denies any c/o distress at present

## 2014-07-23 NOTE — Discharge Instructions (Signed)

## 2014-07-23 NOTE — ED Notes (Signed)
Pt states had a cold a month ago and never got better; lives in a "recovery house"; states several sick people in house; states last night had trouble walking up steps; states feels like lungs fill up when he lays flat; no swelling lower extremities; productive cough; history of emphysema

## 2014-12-03 ENCOUNTER — Other Ambulatory Visit (HOSPITAL_COMMUNITY): Payer: Self-pay

## 2014-12-03 ENCOUNTER — Emergency Department (HOSPITAL_COMMUNITY): Payer: Self-pay

## 2014-12-03 ENCOUNTER — Inpatient Hospital Stay (HOSPITAL_COMMUNITY)
Admission: EM | Admit: 2014-12-03 | Discharge: 2014-12-05 | DRG: 419 | Disposition: A | Discharge status: Home / Self Care | Payer: Self-pay | Attending: Internal Medicine | Admitting: Internal Medicine

## 2014-12-03 ENCOUNTER — Encounter (HOSPITAL_COMMUNITY): Payer: Self-pay

## 2014-12-03 DIAGNOSIS — R1011 Right upper quadrant pain: Secondary | ICD-10-CM

## 2014-12-03 DIAGNOSIS — Z833 Family history of diabetes mellitus: Secondary | ICD-10-CM

## 2014-12-03 DIAGNOSIS — I10 Essential (primary) hypertension: Secondary | ICD-10-CM

## 2014-12-03 DIAGNOSIS — F17210 Nicotine dependence, cigarettes, uncomplicated: Secondary | ICD-10-CM | POA: Diagnosis present

## 2014-12-03 DIAGNOSIS — Z8249 Family history of ischemic heart disease and other diseases of the circulatory system: Secondary | ICD-10-CM

## 2014-12-03 DIAGNOSIS — K8020 Calculus of gallbladder without cholecystitis without obstruction: Secondary | ICD-10-CM

## 2014-12-03 DIAGNOSIS — F419 Anxiety disorder, unspecified: Secondary | ICD-10-CM | POA: Diagnosis present

## 2014-12-03 DIAGNOSIS — E876 Hypokalemia: Secondary | ICD-10-CM | POA: Diagnosis not present

## 2014-12-03 DIAGNOSIS — K8000 Calculus of gallbladder with acute cholecystitis without obstruction: Principal | ICD-10-CM | POA: Diagnosis present

## 2014-12-03 DIAGNOSIS — Z7952 Long term (current) use of systemic steroids: Secondary | ICD-10-CM

## 2014-12-03 DIAGNOSIS — J449 Chronic obstructive pulmonary disease, unspecified: Secondary | ICD-10-CM | POA: Diagnosis present

## 2014-12-03 DIAGNOSIS — F17200 Nicotine dependence, unspecified, uncomplicated: Secondary | ICD-10-CM

## 2014-12-03 DIAGNOSIS — K810 Acute cholecystitis: Secondary | ICD-10-CM | POA: Diagnosis present

## 2014-12-03 DIAGNOSIS — J441 Chronic obstructive pulmonary disease with (acute) exacerbation: Secondary | ICD-10-CM | POA: Diagnosis present

## 2014-12-03 LAB — COMPREHENSIVE METABOLIC PANEL
ALT: 40 U/L (ref 0–53)
AST: 26 U/L (ref 0–37)
Albumin: 4 g/dL (ref 3.5–5.2)
Alkaline Phosphatase: 109 U/L (ref 39–117)
Anion gap: 8 (ref 5–15)
BUN: 7 mg/dL (ref 6–23)
CO2: 29 mmol/L (ref 19–32)
Calcium: 8.6 mg/dL (ref 8.4–10.5)
Chloride: 103 mmol/L (ref 96–112)
Creatinine, Ser: 0.82 mg/dL (ref 0.50–1.35)
GFR calc Af Amer: >90 mL/min (ref >90)
GFR calc non Af Amer: >90 mL/min (ref >90)
Glucose, Bld: 106 mg/dL — ABNORMAL HIGH (ref 70–99)
Potassium: 3.9 mmol/L (ref 3.5–5.1)
Sodium: 140 mmol/L (ref 135–145)
Total Bilirubin: 1.1 mg/dL (ref 0.3–1.2)
Total Protein: 6.7 g/dL (ref 6.0–8.3)

## 2014-12-03 LAB — CBC WITH DIFFERENTIAL/PLATELET
Basophils Absolute: 0 10*3/uL (ref 0.0–0.1)
Basophils Relative: 0 % (ref 0–1)
Eosinophils Absolute: 0.1 10*3/uL (ref 0.0–0.7)
Eosinophils Relative: 1 % (ref 0–5)
HCT: 47.6 % (ref 39.0–52.0)
Hemoglobin: 16 g/dL (ref 13.0–17.0)
Lymphocytes Relative: 19 % (ref 12–46)
Lymphs Abs: 2.3 10*3/uL (ref 0.7–4.0)
MCH: 29 pg (ref 26.0–34.0)
MCHC: 33.6 g/dL (ref 30.0–36.0)
MCV: 86.4 fL (ref 78.0–100.0)
Monocytes Absolute: 0.9 10*3/uL (ref 0.1–1.0)
Monocytes Relative: 8 % (ref 3–12)
Neutro Abs: 8.8 10*3/uL — ABNORMAL HIGH (ref 1.7–7.7)
Neutrophils Relative %: 72 % (ref 43–77)
Platelets: 237 10*3/uL (ref 150–400)
RBC: 5.51 MIL/uL (ref 4.22–5.81)
RDW: 13.6 % (ref 11.5–15.5)
WBC: 12.2 10*3/uL — ABNORMAL HIGH (ref 4.0–10.5)

## 2014-12-03 LAB — URINALYSIS, ROUTINE W REFLEX MICROSCOPIC
Bilirubin Urine: NEGATIVE
Glucose, UA: NEGATIVE mg/dL
Hgb urine dipstick: NEGATIVE
Ketones, ur: NEGATIVE mg/dL
Leukocytes, UA: NEGATIVE
Nitrite: NEGATIVE
Protein, ur: 100 mg/dL — AB
Specific Gravity, Urine: 1.02 (ref 1.005–1.030)
Urobilinogen, UA: 1 mg/dL (ref 0.0–1.0)
pH: 8.5 — ABNORMAL HIGH (ref 5.0–8.0)

## 2014-12-03 LAB — LIPASE, BLOOD: Lipase: 23 U/L (ref 11–59)

## 2014-12-03 LAB — URINE MICROSCOPIC-ADD ON

## 2014-12-03 LAB — TROPONIN I: Troponin I: <0.03 ng/mL (ref <0.031)

## 2014-12-03 MED ORDER — ONDANSETRON 8 MG PO TBDP
8.0000 mg | ORAL_TABLET | Freq: Once | ORAL | Status: AC
  Administered 2014-12-03: 8 mg via ORAL
  Filled 2014-12-03: qty 1

## 2014-12-03 MED ORDER — HYDROCODONE-ACETAMINOPHEN 5-325 MG PO TABS
2.0000 | ORAL_TABLET | Freq: Once | ORAL | Status: AC
  Administered 2014-12-03: 2 via ORAL
  Filled 2014-12-03: qty 2

## 2014-12-03 NOTE — ED Notes (Signed)
Patient transported to X-ray 

## 2014-12-03 NOTE — ED Provider Notes (Signed)
CSN: [id number]     Arrival date & time 12/03/14  1739 History   First MD Initiated Contact with Patient 12/03/14 1947     Chief Complaint  Patient presents with  . Abdominal Pain     (Consider location/radiation/quality/duration/timing/severity/associated sxs/prior Treatment) Patient is a 49 y.o. male presenting with abdominal pain. The history is provided by the patient and medical records. No language interpreter was used.  Abdominal Pain Associated symptoms: nausea   Associated symptoms: no chest pain, no constipation, no cough, no diarrhea, no dysuria, no fatigue, no fever, no hematuria, no shortness of breath and no vomiting       JAXAN MICHEL is a 49 y.o. male  with a hx of COPD, anxiety, HTN, tachycardia presents to the Emergency Department complaining of gradual, persistent, progressively worsening epigastric abd pain onset yesterday evening.  Pt describes the pain as cramping and rated at a 6/10.  Pt reports sick contacts at work with URI, but no N/V/D contacts.  Pt reports taking baking soda without relief.  Pt reports he feels bloated and that he might feel better if he could vomit or defecate.  Pt reports 3 BM's today with only the first being solid.  Denies vomiting, fever, chills, headache, neck pain, chest pain, SOB, weakness, dizziness, syncope.  Nothing makes it better and nothing makes it worse.  Pt denies hx of abd surgeries.  Pt reports Hx of alcoholism (18 pack/night for 20 years) but pt has been sober for 1 year.  Pt reports he is back to smoking 1 ppd since 2013.  Pt denise NSAID usage.  Denies hx of PUD.  Pt denies cardiac hx, but reports both parents with cardiac hx; no early death before 53.  Pt reports hx of cardiac cath in 2010 at [facility] which was normal.     Past Medical History  Diagnosis Date  . COPD (chronic obstructive pulmonary disease)   . Anxiety   . Emphysema   . Hypertension   . Tachycardia    Past Surgical History  Procedure Laterality Date  .  Facial cosmetic surgery    . Thoracotomy     Family History  Problem Relation Age of Onset  . Diabetes Mother   . Heart failure Mother   . Diabetes Father   . Heart failure Father   . Bipolar disorder Brother    History  Substance Use Topics  . Smoking status: Former Smoker    Quit date: 07/19/2011  . Smokeless tobacco: Never Used  . Alcohol Use: No     Comment: recently quit 3 mths ago    Review of Systems  Constitutional: Negative for fever, diaphoresis, appetite change, fatigue and unexpected weight change.  HENT: Negative for mouth sores and trouble swallowing.   Eyes: Negative for visual disturbance.  Respiratory: Negative for cough, chest tightness, shortness of breath, wheezing and stridor.   Cardiovascular: Negative for chest pain and palpitations.  Gastrointestinal: Positive for nausea and abdominal pain. Negative for vomiting, diarrhea, constipation, blood in stool, abdominal distention and rectal pain.  Endocrine: Negative for polydipsia, polyphagia and polyuria.  Genitourinary: Negative for dysuria, urgency, frequency, hematuria, flank pain and difficulty urinating.  Musculoskeletal: Negative for back pain, neck pain and neck stiffness.  Skin: Negative for rash.  Allergic/Immunologic: Negative for immunocompromised state.  Neurological: Negative for syncope, weakness, light-headedness and headaches.  Hematological: Negative for adenopathy. Does not bruise/bleed easily.  Psychiatric/Behavioral: Negative for confusion and sleep disturbance. The patient is not nervous/anxious.   All  other systems reviewed and are negative.     Allergies  Review of patient's allergies indicates no known allergies.  Home Medications   Prior to Admission medications   Medication Sig Start Date End Date Taking? Authorizing Provider  albuterol (PROVENTIL HFA;VENTOLIN HFA) 108 (90 BASE) MCG/ACT inhaler Inhale 1-2 puffs into the lungs every 6 (six) hours as needed for wheezing or  shortness of breath. Patient not taking: Reported on 12/03/2014 07/23/14   Charlesetta Shanks, MD  predniSONE (DELTASONE) 20 MG tablet 3 tabs po day one, then 2 tabs daily x 4 days Patient not taking: Reported on 12/03/2014 07/23/14   Charlesetta Shanks, MD  sulfamethoxazole-trimethoprim (SEPTRA DS) 800-160 MG per tablet Take 1 tablet by mouth 2 (two) times daily. Patient not taking: Reported on 12/03/2014 07/23/14   Charlesetta Shanks, MD   BP 121/62 mmHg  Pulse 56  Temp(Src) 97.9 F (36.6 C) (Oral)  Resp 18  Wt 250 lb (113.399 kg)  SpO2 100% Physical Exam  Constitutional: He appears well-developed and well-nourished.  HENT:  Head: Normocephalic and atraumatic.  Mouth/Throat: Oropharynx is clear and moist.  Eyes: Conjunctivae are normal. No scleral icterus.  Cardiovascular: Normal rate, regular rhythm, normal heart sounds and intact distal pulses.   No murmur heard. Pulmonary/Chest: Effort normal and breath sounds normal.  Abdominal: Soft. Bowel sounds are normal. He exhibits no distension and no mass. There is no hepatosplenomegaly. There is tenderness in the right upper quadrant. There is guarding and positive Murphy's sign. There is no rebound and no CVA tenderness.  Right upper quadrant abdominal tenderness; positive Murphy sign  Neurological: He is alert.  Skin: Skin is warm and dry.  Psychiatric: He has a normal mood and affect.  Nursing note and vitals reviewed.   ED Course  Procedures (including critical care time) Labs Review Labs Reviewed  CBC WITH DIFFERENTIAL/PLATELET - Abnormal; Notable for the following:    WBC 12.2 (*)    Neutro Abs 8.8 (*)    All other components within normal limits  COMPREHENSIVE METABOLIC PANEL - Abnormal; Notable for the following:    Glucose, Bld 106 (*)    All other components within normal limits  URINALYSIS, ROUTINE W REFLEX MICROSCOPIC - Abnormal; Notable for the following:    pH 8.5 (*)    Protein, ur 100 (*)    All other components within normal  limits  LIPASE, BLOOD  TROPONIN I  URINE MICROSCOPIC-ADD ON  COMPREHENSIVE METABOLIC PANEL  CBC WITH DIFFERENTIAL/PLATELET  BRAIN NATRIURETIC PEPTIDE  TYPE AND SCREEN    Imaging Review US Abdomen Complete  12/03/2014   CLINICAL DATA:  Upper abdominal pain for 1 day.  EXAM: ULTRASOUND ABDOMEN COMPLETE  COMPARISON:  Plain films chest and abdomen this same date.  FINDINGS: Gallbladder: A large stone measuring 4.2 cm in diameter is identified in the gallbladder. The gallbladder wall measures up to 1.1 cm in thickness. The majority of the gallbladder wall demonstrates a milder degree of thickening for approximately 0.7 cm. No pericholecystic fluid is seen. A 0.8 cm stone is identified in the gallbladder neck.  Common bile duct: Diameter: 0.3 cm  Liver: No focal lesion is identified. Echotexture is mildly heterogeneous. There is no intrahepatic biliary ductal dilatation.  IVC: No abnormality visualized.  Pancreas: Visualized portion unremarkable.  Spleen: Size and appearance within normal limits.  Right Kidney: Length: 13.2 cm. Echogenicity within normal limits. No mass or hydronephrosis visualized.  Left Kidney: Length: 12.7 cm. Echogenicity within normal limits. No mass or hydronephrosis visualized.  Abdominal aorta: No aneurysm visualized.  Other findings: None.  IMPRESSION: 4.2 cm stone in the gallbladder with a 0.8 cm stone in the gallbladder neck. The gallbladder wall is thickened but the sonographer reports negative Murphy's sign and no pericholecystic fluid is identified. Findings are suggestive of cholecystitis but not definitive.   Electronically Signed   By: Inge Rise M.D.   On: 12/03/2014 21:42   Dg Abd Acute W/chest  12/03/2014   CLINICAL DATA:  Acute onset of generalized abdominal cramping for 24 hours. Initial encounter.  EXAM: ACUTE ABDOMEN SERIES (ABDOMEN 2 VIEW & CHEST 1 VIEW)  COMPARISON:  Chest radiograph performed 07/23/2014  FINDINGS: The lungs are well-aerated. Vascular  congestion is noted. Bilateral central airspace opacities are increased from the prior study and may reflect mild pulmonary edema. Underlying bibasilar scarring is noted. A small left pleural effusion is seen. No pneumothorax is identified. The cardiomediastinal silhouette is borderline normal in size.  The visualized bowel gas pattern is unremarkable. Scattered fluid and air are seen within the colon; there is no evidence of small bowel dilatation to suggest obstruction. No free intra-abdominal air is identified on the provided upright view.  No acute osseous abnormalities are seen; the sacroiliac joints are unremarkable in appearance. Postoperative change is noted at the iliac wings bilaterally, with underlying chronic deformity.  IMPRESSION: 1. Vascular congestion noted. Bilateral central airspace opacities are increased from the prior study and may reflect mild pulmonary edema. Underlying bibasilar scarring noted. Small left pleural effusion seen. 2. Unremarkable bowel gas pattern; no free intra-abdominal air seen.   Electronically Signed   By: Garald Balding M.D.   On: 12/03/2014 21:11     EKG Interpretation None       ECG:  Date: 12/04/2014  Rate: 54  Rhythm: normal sinus rhythm  QRS Axis: right  Intervals: normal  ST/T Wave abnormalities: normal  Conduction Disutrbances:none  Narrative Interpretation: nonischemic ECG, no old for comparison  Old EKG Reviewed: none available    MDM   Final diagnoses:  Gallstones  RUQ abdominal pain  Essential hypertension  TOBACCO USE   Raye Sorrow  Presents with right upper quadrant abdominal pain and positive Murphy's sign on clinical exam. Patient with leukocytosis of 12.2. He currently declines pain medication because he reports a history of polysubstance abuse and wishes to not have pain control.  Will obtain ultrasound of the gallbladder.  10:15PM Ultrasound with 4.2 cm stone in the gallbladder with a 0.8 cm stone in the gallbladder  neck.  Patient remains uncomfortable. With ultrasound findings and leukocytosis will consult surgery for potential cholecystectomy tomorrow.  11:23 PM Discussed with Dr. Marcello Moores who will evaluate in the morning with planned cholecystectomy tomorrow.  Pt will be kept NPO after midnight.  Discussed with Dr. Hal Hope who will admit to med-surg.   BP 121/62 mmHg  Pulse 56  Temp(Src) 97.9 F (36.6 C) (Oral)  Resp 18  Wt 250 lb (113.399 kg)  SpO2 100%   Abigail Butts, PA-C 12/04/14 0059  Richarda Blade, MD 12/04/14 [phone]

## 2014-12-03 NOTE — ED Notes (Signed)
Ultrasound at bedside

## 2014-12-03 NOTE — ED Notes (Signed)
Patient's brother-in-law, Purcell Mouton, would like to be notified of any concerns/changes: [phone]

## 2014-12-03 NOTE — ED Notes (Signed)
Per pt, abdominal pain since yesterday.  Nausea no vomiting.  No fever.  No diarrhea

## 2014-12-03 NOTE — Consult Note (Signed)
Reason for Consult:RUQ pian  Referring Physician: Dr Royetta Crochet is an 49 y.o. male.  HPI: Pt with abdominal pain since yesterday. This worsened after eating.  It is associated with nausea, but no vomiting. No fever. No diarrhea or constipation.  He has never had this pain before yesterday.  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  He is a smoker.  He denies any h/o abd surgery.    Past Medical History  Diagnosis Date  . COPD (chronic obstructive pulmonary disease)   . Anxiety   . Emphysema   . Hypertension   . Tachycardia     Past Surgical History  Procedure Laterality Date  . Facial cosmetic surgery    . Thoracotomy      Family History  Problem Relation Age of Onset  . Diabetes Mother   . Heart failure Mother   . Diabetes Father   . Heart failure Father   . Bipolar disorder Brother     Social History:  reports that he quit smoking about 3 years ago. He has never used smokeless tobacco. He reports that he does not drink alcohol or use illicit drugs.  Allergies: No Known Allergies  Medications: I have reviewed the patient's current medications.  Results for orders placed or performed during the hospital encounter of 12/03/14 (from the past 48 hour(s))  CBC with Differential     Status: Abnormal   Collection Time: 12/03/14  6:53 PM  Result Value Ref Range   WBC 12.2 (H) 4.0 - 10.5 K/uL   RBC 5.51 4.22 - 5.81 MIL/uL   Hemoglobin 16.0 13.0 - 17.0 g/dL   HCT 47.6 39.0 - 52.0 %   MCV 86.4 78.0 - 100.0 fL   MCH 29.0 26.0 - 34.0 pg   MCHC 33.6 30.0 - 36.0 g/dL   RDW 13.6 11.5 - 15.5 %   Platelets 237 150 - 400 K/uL   Neutrophils Relative % 72 43 - 77 %   Neutro Abs 8.8 (H) 1.7 - 7.7 K/uL   Lymphocytes Relative 19 12 - 46 %   Lymphs Abs 2.3 0.7 - 4.0 K/uL   Monocytes Relative 8 3 - 12 %   Monocytes Absolute 0.9 0.1 - 1.0 K/uL   Eosinophils Relative 1 0 - 5 %   Eosinophils Absolute 0.1 0.0 - 0.7 K/uL   Basophils  Relative 0 0 - 1 %   Basophils Absolute 0.0 0.0 - 0.1 K/uL  Comprehensive metabolic panel     Status: Abnormal   Collection Time: 12/03/14  6:53 PM  Result Value Ref Range   Sodium 140 135 - 145 mmol/L   Potassium 3.9 3.5 - 5.1 mmol/L   Chloride 103 96 - 112 mmol/L   CO2 29 19 - 32 mmol/L   Glucose, Bld 106 (H) 70 - 99 mg/dL   BUN 7 6 - 23 mg/dL   Creatinine, Ser 0.82 0.50 - 1.35 mg/dL   Calcium 8.6 8.4 - 10.5 mg/dL   Total Protein 6.7 6.0 - 8.3 g/dL   Albumin 4.0 3.5 - 5.2 g/dL   AST 26 0 - 37 U/L   ALT 40 0 - 53 U/L   Alkaline Phosphatase 109 39 - 117 U/L   Total Bilirubin 1.1 0.3 - 1.2 mg/dL   GFR calc non Af Amer >90 >90 mL/min   GFR calc Af Amer >90 >90 mL/min    Comment: (NOTE) The eGFR has been calculated using the CKD  EPI equation. This calculation has not been validated in all clinical situations. eGFR's persistently <90 mL/min signify possible Chronic Kidney Disease.    Anion gap 8 5 - 15  Lipase, blood     Status: None   Collection Time: 12/03/14  6:53 PM  Result Value Ref Range   Lipase 23 11 - 59 U/L  Troponin I     Status: None   Collection Time: 12/03/14  8:10 PM  Result Value Ref Range   Troponin I <0.03 <0.031 ng/mL    Comment:        NO INDICATION OF MYOCARDIAL INJURY.   Urinalysis, Routine w reflex microscopic     Status: Abnormal   Collection Time: 12/03/14  8:36 PM  Result Value Ref Range   Color, Urine YELLOW YELLOW   APPearance CLEAR CLEAR   Specific Gravity, Urine 1.020 1.005 - 1.030   pH 8.5 (H) 5.0 - 8.0   Glucose, UA NEGATIVE NEGATIVE mg/dL   Hgb urine dipstick NEGATIVE NEGATIVE   Bilirubin Urine NEGATIVE NEGATIVE   Ketones, ur NEGATIVE NEGATIVE mg/dL   Protein, ur 100 (A) NEGATIVE mg/dL   Urobilinogen, UA 1.0 0.0 - 1.0 mg/dL   Nitrite NEGATIVE NEGATIVE   Leukocytes, UA NEGATIVE NEGATIVE  Urine microscopic-add on     Status: None   Collection Time: 12/03/14  8:36 PM  Result Value Ref Range   WBC, UA 0-2 <3 WBC/hpf   RBC / HPF 0-2  <3 RBC/hpf    US Abdomen Complete  12/03/2014   CLINICAL DATA:  Upper abdominal pain for 1 day.  EXAM: ULTRASOUND ABDOMEN COMPLETE  COMPARISON:  Plain films chest and abdomen this same date.  FINDINGS: Gallbladder: A large stone measuring 4.2 cm in diameter is identified in the gallbladder. The gallbladder wall measures up to 1.1 cm in thickness. The majority of the gallbladder wall demonstrates a milder degree of thickening for approximately 0.7 cm. No pericholecystic fluid is seen. A 0.8 cm stone is identified in the gallbladder neck.  Common bile duct: Diameter: 0.3 cm  Liver: No focal lesion is identified. Echotexture is mildly heterogeneous. There is no intrahepatic biliary ductal dilatation.  IVC: No abnormality visualized.  Pancreas: Visualized portion unremarkable.  Spleen: Size and appearance within normal limits.  Right Kidney: Length: 13.2 cm. Echogenicity within normal limits. No mass or hydronephrosis visualized.  Left Kidney: Length: 12.7 cm. Echogenicity within normal limits. No mass or hydronephrosis visualized.  Abdominal aorta: No aneurysm visualized.  Other findings: None.  IMPRESSION: 4.2 cm stone in the gallbladder with a 0.8 cm stone in the gallbladder neck. The gallbladder wall is thickened but the sonographer reports negative Murphy's sign and no pericholecystic fluid is identified. Findings are suggestive of cholecystitis but not definitive.   Electronically Signed   By: Inge Rise M.D.   On: 12/03/2014 21:42   Dg Abd Acute W/chest  12/03/2014   CLINICAL DATA:  Acute onset of generalized abdominal cramping for 24 hours. Initial encounter.  EXAM: ACUTE ABDOMEN SERIES (ABDOMEN 2 VIEW & CHEST 1 VIEW)  COMPARISON:  Chest radiograph performed 07/23/2014  FINDINGS: The lungs are well-aerated. Vascular congestion is noted. Bilateral central airspace opacities are increased from the prior study and may reflect mild pulmonary edema. Underlying bibasilar scarring is noted. A small left  pleural effusion is seen. No pneumothorax is identified. The cardiomediastinal silhouette is borderline normal in size.  The visualized bowel gas pattern is unremarkable. Scattered fluid and air are seen within the colon; there is  no evidence of small bowel dilatation to suggest obstruction. No free intra-abdominal air is identified on the provided upright view.  No acute osseous abnormalities are seen; the sacroiliac joints are unremarkable in appearance. Postoperative change is noted at the iliac wings bilaterally, with underlying chronic deformity.  IMPRESSION: 1. Vascular congestion noted. Bilateral central airspace opacities are increased from the prior study and may reflect mild pulmonary edema. Underlying bibasilar scarring noted. Small left pleural effusion seen. 2. Unremarkable bowel gas pattern; no free intra-abdominal air seen.   Electronically Signed   By: Garald Balding M.D.   On: 12/03/2014 21:11    Review of Systems  Constitutional: Negative for fever and chills.  HENT: Negative for sore throat.   Respiratory: Negative for cough and shortness of breath.   Cardiovascular: Negative for chest pain and palpitations.  Gastrointestinal: Positive for nausea and abdominal pain. Negative for vomiting and diarrhea.  Genitourinary: Negative for dysuria, urgency and frequency.  Skin: Negative for itching and rash.  Neurological: Negative for dizziness, tingling and headaches.   Blood pressure 118/61, pulse 84, temperature 97.9 F (36.6 C), temperature source Oral, resp. rate 17, weight 250 lb (113.399 kg), SpO2 94 %. Physical Exam  Constitutional: He is oriented to person, place, and time. He appears well-developed and well-nourished. No distress.  HENT:  Head: Normocephalic and atraumatic.  Eyes: Conjunctivae are normal. Pupils are equal, round, and reactive to light.  Neck: Normal range of motion. Neck supple.  Cardiovascular: Normal rate and regular rhythm.   Respiratory: Effort normal  and breath sounds normal.  GI: Soft. Bowel sounds are normal. He exhibits no distension. There is tenderness.  RUQ  Musculoskeletal: Normal range of motion.  Neurological: He is alert and oriented to person, place, and time.  Skin: Skin is warm and dry. He is not diaphoretic.    Assessment/Plan: Pt to be admitted to medical service.  Pt requesting no narcotics due to abuse history.  Will most likely benefit from cholecystectomy given gallstones, typical abdominal pain and elevated wbc.  LFTs normal.  [place] with some mild wall thickening.  Would hold steroid taper for now.    Kayla Deshaies C. 11/12/4763, 46:50 PM

## 2014-12-04 ENCOUNTER — Inpatient Hospital Stay (HOSPITAL_COMMUNITY): Payer: Self-pay | Admitting: Anesthesiology

## 2014-12-04 ENCOUNTER — Encounter (HOSPITAL_COMMUNITY): Payer: Self-pay | Admitting: Internal Medicine

## 2014-12-04 ENCOUNTER — Inpatient Hospital Stay (HOSPITAL_COMMUNITY): Payer: Self-pay

## 2014-12-04 ENCOUNTER — Encounter (HOSPITAL_COMMUNITY)
Admission: EM | Disposition: A | Discharge status: Home / Self Care | Payer: Self-pay | Source: Home / Self Care | Attending: Internal Medicine

## 2014-12-04 DIAGNOSIS — K810 Acute cholecystitis: Secondary | ICD-10-CM

## 2014-12-04 DIAGNOSIS — J42 Unspecified chronic bronchitis: Secondary | ICD-10-CM

## 2014-12-04 DIAGNOSIS — Z720 Tobacco use: Secondary | ICD-10-CM

## 2014-12-04 DIAGNOSIS — K8020 Calculus of gallbladder without cholecystitis without obstruction: Secondary | ICD-10-CM

## 2014-12-04 DIAGNOSIS — J441 Chronic obstructive pulmonary disease with (acute) exacerbation: Secondary | ICD-10-CM | POA: Diagnosis present

## 2014-12-04 DIAGNOSIS — J438 Other emphysema: Secondary | ICD-10-CM

## 2014-12-04 HISTORY — PX: CHOLECYSTECTOMY: SHX55

## 2014-12-04 LAB — CBC WITH DIFFERENTIAL/PLATELET
Basophils Absolute: 0 10*3/uL (ref 0.0–0.1)
Basophils Relative: 0 % (ref 0–1)
Eosinophils Absolute: 0.1 10*3/uL (ref 0.0–0.7)
Eosinophils Relative: 1 % (ref 0–5)
HCT: 45.5 % (ref 39.0–52.0)
Hemoglobin: 15.2 g/dL (ref 13.0–17.0)
Lymphocytes Relative: 21 % (ref 12–46)
Lymphs Abs: 2.8 10*3/uL (ref 0.7–4.0)
MCH: 28.6 pg (ref 26.0–34.0)
MCHC: 33.4 g/dL (ref 30.0–36.0)
MCV: 85.7 fL (ref 78.0–100.0)
Monocytes Absolute: 1 10*3/uL (ref 0.1–1.0)
Monocytes Relative: 8 % (ref 3–12)
Neutro Abs: 9.5 10*3/uL — ABNORMAL HIGH (ref 1.7–7.7)
Neutrophils Relative %: 70 % (ref 43–77)
Platelets: 231 10*3/uL (ref 150–400)
RBC: 5.31 MIL/uL (ref 4.22–5.81)
RDW: 13.5 % (ref 11.5–15.5)
WBC: 13.5 10*3/uL — ABNORMAL HIGH (ref 4.0–10.5)

## 2014-12-04 LAB — TYPE AND SCREEN
ABO/RH(D): O POS
Antibody Screen: NEGATIVE

## 2014-12-04 LAB — ABO/RH: ABO/RH(D): O POS

## 2014-12-04 LAB — GLUCOSE, CAPILLARY
Glucose-Capillary: 104 mg/dL — ABNORMAL HIGH (ref 70–99)
Glucose-Capillary: 97 mg/dL (ref 70–99)

## 2014-12-04 LAB — COMPREHENSIVE METABOLIC PANEL
ALT: 34 U/L (ref 0–53)
AST: 21 U/L (ref 0–37)
Albumin: 3.8 g/dL (ref 3.5–5.2)
Alkaline Phosphatase: 102 U/L (ref 39–117)
Anion gap: 7 (ref 5–15)
BUN: 8 mg/dL (ref 6–23)
CO2: 26 mmol/L (ref 19–32)
Calcium: 8.3 mg/dL — ABNORMAL LOW (ref 8.4–10.5)
Chloride: 101 mmol/L (ref 96–112)
Creatinine, Ser: 0.77 mg/dL (ref 0.50–1.35)
GFR calc Af Amer: >90 mL/min (ref >90)
GFR calc non Af Amer: >90 mL/min (ref >90)
Glucose, Bld: 118 mg/dL — ABNORMAL HIGH (ref 70–99)
Potassium: 3.4 mmol/L — ABNORMAL LOW (ref 3.5–5.1)
Sodium: 134 mmol/L — ABNORMAL LOW (ref 135–145)
Total Bilirubin: 1.2 mg/dL (ref 0.3–1.2)
Total Protein: 6.4 g/dL (ref 6.0–8.3)

## 2014-12-04 LAB — PROTIME-INR
INR: 1.11 (ref 0.00–1.49)
Prothrombin Time: 14.4 seconds (ref 11.6–15.2)

## 2014-12-04 LAB — BRAIN NATRIURETIC PEPTIDE: B Natriuretic Peptide: 148 pg/mL — ABNORMAL HIGH (ref 0.0–100.0)

## 2014-12-04 LAB — SURGICAL PCR SCREEN
MRSA, PCR: INVALID — AB
Staphylococcus aureus: INVALID — AB

## 2014-12-04 LAB — APTT: aPTT: 33 seconds (ref 24–37)

## 2014-12-04 SURGERY — LAPAROSCOPIC CHOLECYSTECTOMY WITH INTRAOPERATIVE CHOLANGIOGRAM
Anesthesia: General

## 2014-12-04 MED ORDER — SUCCINYLCHOLINE CHLORIDE 20 MG/ML IJ SOLN
INTRAMUSCULAR | Status: DC | PRN
  Administered 2014-12-04: 100 mg via INTRAVENOUS

## 2014-12-04 MED ORDER — NEOSTIGMINE METHYLSULFATE 10 MG/10ML IV SOLN
INTRAVENOUS | Status: AC
  Filled 2014-12-04: qty 1

## 2014-12-04 MED ORDER — FENTANYL CITRATE 0.05 MG/ML IJ SOLN
25.0000 ug | INTRAMUSCULAR | Status: DC | PRN
Start: 2014-12-04 — End: 2014-12-04

## 2014-12-04 MED ORDER — ACETAMINOPHEN 160 MG/5ML PO SOLN: 325.0000 mg | ORAL | Status: DC | PRN

## 2014-12-04 MED ORDER — OXYCODONE HCL 5 MG/5ML PO SOLN: 5.0000 mg | Freq: Once | ORAL | Status: DC | PRN

## 2014-12-04 MED ORDER — LACTATED RINGERS IV SOLN
INTRAVENOUS | Status: DC | PRN
  Administered 2014-12-04 (×2): via INTRAVENOUS

## 2014-12-04 MED ORDER — FENTANYL CITRATE 0.05 MG/ML IJ SOLN
INTRAMUSCULAR | Status: DC | PRN
  Administered 2014-12-04: 100 ug via INTRAVENOUS
  Administered 2014-12-04: 50 ug via INTRAVENOUS

## 2014-12-04 MED ORDER — ONDANSETRON HCL 4 MG/2ML IJ SOLN: 4.0000 mg | Freq: Four times a day (QID) | INTRAMUSCULAR | Status: DC | PRN

## 2014-12-04 MED ORDER — OXYCODONE-ACETAMINOPHEN 5-325 MG PO TABS
1.0000 | ORAL_TABLET | ORAL | Status: DC | PRN
  Administered 2014-12-04 – 2014-12-05 (×3): 1 via ORAL
  Filled 2014-12-04 (×3): qty 1

## 2014-12-04 MED ORDER — PIPERACILLIN-TAZOBACTAM 3.375 G IVPB
3.3750 g | Freq: Three times a day (TID) | INTRAVENOUS | Status: DC
  Administered 2014-12-04 (×2): 3.375 g via INTRAVENOUS
  Filled 2014-12-04 (×3): qty 50

## 2014-12-04 MED ORDER — HEPARIN SODIUM (PORCINE) 5000 UNIT/ML IJ SOLN
5000.0000 [IU] | Freq: Three times a day (TID) | INTRAMUSCULAR | Status: DC
Start: 2014-12-05 — End: 2014-12-05
  Filled 2014-12-04 (×4): qty 1

## 2014-12-04 MED ORDER — PROMETHAZINE HCL 25 MG/ML IJ SOLN
INTRAMUSCULAR | Status: AC
  Filled 2014-12-04: qty 1

## 2014-12-04 MED ORDER — LACTATED RINGERS IR SOLN
Status: DC | PRN
  Administered 2014-12-04: 1000 mL

## 2014-12-04 MED ORDER — BUPIVACAINE-EPINEPHRINE 0.25% -1:200000 IJ SOLN
INTRAMUSCULAR | Status: AC
  Filled 2014-12-04: qty 1

## 2014-12-04 MED ORDER — PROMETHAZINE HCL 25 MG/ML IJ SOLN: 12.5000 mg | Freq: Four times a day (QID) | INTRAMUSCULAR | Status: DC | PRN

## 2014-12-04 MED ORDER — DEXAMETHASONE SODIUM PHOSPHATE 10 MG/ML IJ SOLN
INTRAMUSCULAR | Status: DC | PRN
  Administered 2014-12-04: 10 mg via INTRAVENOUS

## 2014-12-04 MED ORDER — HYDRALAZINE HCL 20 MG/ML IJ SOLN: 10.0000 mg | INTRAMUSCULAR | Status: DC | PRN

## 2014-12-04 MED ORDER — OXYCODONE HCL 5 MG/5ML PO SOLN
5.0000 mg | Freq: Once | ORAL | Status: DC | PRN
  Filled 2014-12-04: qty 5

## 2014-12-04 MED ORDER — SODIUM CHLORIDE 0.9 % IJ SOLN
INTRAMUSCULAR | Status: AC
  Filled 2014-12-04: qty 10

## 2014-12-04 MED ORDER — NEOSTIGMINE METHYLSULFATE 10 MG/10ML IV SOLN
INTRAVENOUS | Status: DC | PRN
Start: 2014-12-04 — End: 2014-12-04
  Administered 2014-12-04: 5 mg via INTRAVENOUS

## 2014-12-04 MED ORDER — PHENYLEPHRINE 40 MCG/ML (10ML) SYRINGE FOR IV PUSH (FOR BLOOD PRESSURE SUPPORT)
PREFILLED_SYRINGE | INTRAVENOUS | Status: AC
  Filled 2014-12-04: qty 10

## 2014-12-04 MED ORDER — PHENYLEPHRINE HCL 10 MG/ML IJ SOLN
INTRAMUSCULAR | Status: DC | PRN
  Administered 2014-12-04: 80 ug via INTRAVENOUS
  Administered 2014-12-04: 40 ug via INTRAVENOUS
  Administered 2014-12-04: 80 ug via INTRAVENOUS

## 2014-12-04 MED ORDER — BUPIVACAINE-EPINEPHRINE 0.25% -1:200000 IJ SOLN
INTRAMUSCULAR | Status: DC | PRN
  Administered 2014-12-04: 30 mL

## 2014-12-04 MED ORDER — LACTATED RINGERS IV SOLN
INTRAVENOUS | Status: DC
  Administered 2014-12-04: 1000 mL via INTRAVENOUS

## 2014-12-04 MED ORDER — GLYCOPYRROLATE 0.2 MG/ML IJ SOLN
INTRAMUSCULAR | Status: AC
  Filled 2014-12-04: qty 4

## 2014-12-04 MED ORDER — OXYCODONE HCL 5 MG PO TABS: 5.0000 mg | ORAL_TABLET | Freq: Once | ORAL | Status: DC | PRN

## 2014-12-04 MED ORDER — DEXTROSE-NACL 5-0.9 % IV SOLN
INTRAVENOUS | Status: DC
  Administered 2014-12-04: 50 mL via INTRAVENOUS

## 2014-12-04 MED ORDER — ONDANSETRON HCL 4 MG/2ML IJ SOLN
INTRAMUSCULAR | Status: DC | PRN
  Administered 2014-12-04: 4 mg via INTRAVENOUS

## 2014-12-04 MED ORDER — GLYCOPYRROLATE 0.2 MG/ML IJ SOLN
INTRAMUSCULAR | Status: AC
Start: 2014-12-04 — End: 2014-12-04
  Filled 2014-12-04: qty 1

## 2014-12-04 MED ORDER — ONDANSETRON HCL 4 MG/2ML IJ SOLN
INTRAMUSCULAR | Status: AC
  Filled 2014-12-04: qty 2

## 2014-12-04 MED ORDER — CISATRACURIUM BESYLATE 20 MG/10ML IV SOLN
INTRAVENOUS | Status: AC
  Filled 2014-12-04: qty 10

## 2014-12-04 MED ORDER — GLYCOPYRROLATE 0.2 MG/ML IJ SOLN
INTRAMUSCULAR | Status: DC | PRN
  Administered 2014-12-04: .8 mg via INTRAVENOUS
  Administered 2014-12-04: 0.2 mg via INTRAVENOUS

## 2014-12-04 MED ORDER — KCL IN DEXTROSE-NACL 40-5-0.9 MEQ/L-%-% IV SOLN
INTRAVENOUS | Status: DC
  Administered 2014-12-04 – 2014-12-05 (×2): via INTRAVENOUS
  Filled 2014-12-04 (×5): qty 1000

## 2014-12-04 MED ORDER — PROPOFOL 10 MG/ML IV BOLUS
INTRAVENOUS | Status: AC
  Filled 2014-12-04: qty 20

## 2014-12-04 MED ORDER — CISATRACURIUM BESYLATE (PF) 10 MG/5ML IV SOLN
INTRAVENOUS | Status: DC | PRN
  Administered 2014-12-04: 6 mg via INTRAVENOUS
  Administered 2014-12-04: 1 mg via INTRAVENOUS

## 2014-12-04 MED ORDER — ACETAMINOPHEN 160 MG/5ML PO SOLN
325.0000 mg | ORAL | Status: DC | PRN
  Filled 2014-12-04: qty 20.3

## 2014-12-04 MED ORDER — PROMETHAZINE HCL 25 MG/ML IJ SOLN
6.2500 mg | Freq: Once | INTRAMUSCULAR | Status: AC
  Administered 2014-12-04: 6.25 mg via INTRAVENOUS

## 2014-12-04 MED ORDER — MIDAZOLAM HCL 2 MG/2ML IJ SOLN
INTRAMUSCULAR | Status: AC
  Filled 2014-12-04: qty 2

## 2014-12-04 MED ORDER — LACTATED RINGERS IV SOLN
INTRAVENOUS | Status: DC
  Administered 2014-12-04: 18:00:00 via INTRAVENOUS

## 2014-12-04 MED ORDER — ACETAMINOPHEN 325 MG PO TABS: 325.0000 mg | ORAL_TABLET | ORAL | Status: DC | PRN

## 2014-12-04 MED ORDER — PROPOFOL 10 MG/ML IV BOLUS
INTRAVENOUS | Status: DC | PRN
  Administered 2014-12-04: 200 mg via INTRAVENOUS

## 2014-12-04 MED ORDER — ONDANSETRON HCL 4 MG PO TABS: 4.0000 mg | ORAL_TABLET | Freq: Four times a day (QID) | ORAL | Status: DC | PRN

## 2014-12-04 MED ORDER — DIATRIZOATE MEGLUMINE 30 % UR SOLN
URETHRAL | Status: DC | PRN
  Administered 2014-12-04: 2.5 mL

## 2014-12-04 MED ORDER — FENTANYL CITRATE 0.05 MG/ML IJ SOLN: 25.0000 ug | INTRAMUSCULAR | Status: DC | PRN

## 2014-12-04 MED ORDER — ACETAMINOPHEN 650 MG RE SUPP
650.0000 mg | Freq: Four times a day (QID) | RECTAL | Status: DC | PRN
Start: 2014-12-04 — End: 2014-12-05

## 2014-12-04 MED ORDER — METOCLOPRAMIDE HCL 5 MG/ML IJ SOLN
INTRAMUSCULAR | Status: DC | PRN
  Administered 2014-12-04: 10 mg via INTRAVENOUS

## 2014-12-04 MED ORDER — MORPHINE SULFATE 2 MG/ML IJ SOLN: 1.0000 mg | INTRAMUSCULAR | Status: DC | PRN

## 2014-12-04 MED ORDER — ONDANSETRON HCL 4 MG/2ML IJ SOLN: 4.0000 mg | Freq: Three times a day (TID) | INTRAMUSCULAR | Status: DC | PRN

## 2014-12-04 MED ORDER — PIPERACILLIN-TAZOBACTAM 3.375 G IVPB
3.3750 g | Freq: Three times a day (TID) | INTRAVENOUS | Status: AC
Start: 2014-12-04 — End: 2014-12-05
  Administered 2014-12-04 – 2014-12-05 (×3): 3.375 g via INTRAVENOUS
  Filled 2014-12-04 (×3): qty 50

## 2014-12-04 MED ORDER — ACETAMINOPHEN 325 MG PO TABS: 650.0000 mg | ORAL_TABLET | Freq: Four times a day (QID) | ORAL | Status: DC | PRN

## 2014-12-04 MED ORDER — IBUPROFEN 200 MG PO TABS: 600.0000 mg | ORAL_TABLET | Freq: Four times a day (QID) | ORAL | Status: DC | PRN

## 2014-12-04 MED ORDER — FENTANYL CITRATE 0.05 MG/ML IJ SOLN
INTRAMUSCULAR | Status: AC
  Filled 2014-12-04: qty 5

## 2014-12-04 MED ORDER — ALBUTEROL SULFATE (2.5 MG/3ML) 0.083% IN NEBU: 2.5000 mg | INHALATION_SOLUTION | RESPIRATORY_TRACT | Status: DC | PRN

## 2014-12-04 MED ORDER — METOCLOPRAMIDE HCL 5 MG/ML IJ SOLN
INTRAMUSCULAR | Status: AC
  Filled 2014-12-04: qty 2

## 2014-12-04 MED ORDER — DEXAMETHASONE SODIUM PHOSPHATE 10 MG/ML IJ SOLN
INTRAMUSCULAR | Status: AC
  Filled 2014-12-04: qty 1

## 2014-12-04 SURGICAL SUPPLY — 38 items
APPLIER CLIP 5 13 M/L LIGAMAX5 (MISCELLANEOUS) ×4
APPLIER CLIP ROT 10 11.4 M/L (STAPLE)
APR CLP MED LRG 11.4X10 (STAPLE)
APR CLP MED LRG 5 ANG JAW (MISCELLANEOUS) ×2
BAG SPEC RTRVL 10 TROC 200 (ENDOMECHANICALS) ×1
BAG SPEC RTRVL LRG 6X4 10 (ENDOMECHANICALS)
CHLORAPREP W/TINT 26ML (MISCELLANEOUS) ×2 IMPLANT
CLIP APPLIE 5 13 M/L LIGAMAX5 (MISCELLANEOUS) IMPLANT
CLIP APPLIE ROT 10 11.4 M/L (STAPLE) IMPLANT
COVER MAYO STAND STRL (DRAPES) ×1 IMPLANT
DECANTER SPIKE VIAL GLASS SM (MISCELLANEOUS) ×1 IMPLANT
DRAPE C-ARM 42X120 X-RAY (DRAPES) ×1 IMPLANT
DRAPE LAPAROSCOPIC ABDOMINAL (DRAPES) ×2 IMPLANT
ELECT REM PT RETURN 9FT ADLT (ELECTROSURGICAL) ×2
ELECTRODE REM PT RTRN 9FT ADLT (ELECTROSURGICAL) ×1 IMPLANT
ENDOLOOP SUT PDS II  0 18 (SUTURE) ×1
ENDOLOOP SUT PDS II 0 18 (SUTURE) IMPLANT
GLOVE BIO SURGEON STRL SZ7.5 (GLOVE) ×2 IMPLANT
GLOVE BIOGEL M STRL SZ7.5 (GLOVE) IMPLANT
GLOVE BIOGEL PI IND STRL 7.0 (GLOVE) ×1 IMPLANT
GLOVE BIOGEL PI INDICATOR 7.0 (GLOVE) ×1
GOWN STRL REUS W/TWL XL LVL3 (GOWN DISPOSABLE) ×6 IMPLANT
KIT BASIN OR (CUSTOM PROCEDURE TRAY) ×2 IMPLANT
LIQUID BAND (GAUZE/BANDAGES/DRESSINGS) ×2 IMPLANT
NS IRRIG 1000ML POUR BTL (IV SOLUTION) ×2 IMPLANT
POUCH RETRIEVAL ECOSAC 10 (ENDOMECHANICALS) IMPLANT
POUCH RETRIEVAL ECOSAC 10MM (ENDOMECHANICALS) ×1
POUCH SPECIMEN RETRIEVAL 10MM (ENDOMECHANICALS) ×1 IMPLANT
SET CHOLANGIOGRAPH MIX (MISCELLANEOUS) ×1 IMPLANT
SET IRRIG TUBING LAPAROSCOPIC (IRRIGATION / IRRIGATOR) ×2 IMPLANT
SLEEVE XCEL OPT CAN 5 100 (ENDOMECHANICALS) ×4 IMPLANT
SUT MNCRL AB 4-0 PS2 18 (SUTURE) ×2 IMPLANT
TOWEL OR 17X26 10 PK STRL BLUE (TOWEL DISPOSABLE) ×2 IMPLANT
TOWEL OR NON WOVEN STRL DISP B (DISPOSABLE) ×2 IMPLANT
TRAY LAPAROSCOPIC (CUSTOM PROCEDURE TRAY) ×2 IMPLANT
TROCAR BLADELESS OPT 5 100 (ENDOMECHANICALS) ×2 IMPLANT
TROCAR XCEL BLUNT TIP 100MML (ENDOMECHANICALS) ×2 IMPLANT
TROCAR XCEL NON-BLD 11X100MML (ENDOMECHANICALS) ×1 IMPLANT

## 2014-12-04 NOTE — Op Note (Signed)
[place] WENZLICK [id number] 1966-02-22 12/04/2014  Laparoscopic Cholecystectomy with IOC Procedure Note  Indications: This patient presents with symptomatic gallbladder disease and will undergo laparoscopic cholecystectomy.  Pre-operative Diagnosis: Calculus of gallbladder with acute cholecystitis, without mention of obstruction  Post-operative Diagnosis: Same  Surgeon: Gayland Curry   Assistants: none  Anesthesia: General endotracheal anesthesia  ASA Class: 3  Procedure Details  The patient was seen again in the Holding Room. The risks, benefits, complications, treatment options, and expected outcomes were discussed with the patient. The possibilities of reaction to medication, pulmonary aspiration, perforation of viscus, bleeding, recurrent infection, finding a normal gallbladder, the need for additional procedures, failure to diagnose a condition, the possible need to convert to an open procedure, and creating a complication requiring transfusion or operation were discussed with the patient. The likelihood of improving the patient's symptoms with return to their baseline status is good.  The patient and/or family concurred with the proposed plan, giving informed consent. The site of surgery properly noted. The patient was taken to Operating Room, identified as Ray Griffin and the procedure verified as Laparoscopic Cholecystectomy with Intraoperative Cholangiogram. A Time Out was held and the above information confirmed.Patient was on therapeutic scheduled IV Antibiotic.   Prior to the induction of general anesthesia, antibiotic prophylaxis was administered. General endotracheal anesthesia was then administered and tolerated well. After the induction, the abdomen was prepped with Chloraprep and draped in the sterile fashion. The patient was positioned in the supine position.  Local anesthetic agent was injected into the skin near the umbilicus and an incision made. We dissected down to the  abdominal fascia with blunt dissection.  The fascia was incised vertically and we entered the peritoneal cavity bluntly.  A pursestring suture of 0-Vicryl was placed around the fascial opening.  The Hasson cannula was inserted and secured with the stay suture.  Pneumoperitoneum was then created with CO2 and tolerated well without any adverse changes in the patient's vital signs. An 5-mm port was placed in the subxiphoid position.  Two 5-mm ports were placed in the right upper quadrant. All skin incisions were infiltrated with a local anesthetic agent before making the incision and placing the trocars.   We positioned the patient in reverse Trendelenburg, tilted slightly to the patient's left.  The gallbladder was distended. There was omentum stuck to the gallbladder. There was bile tinged ascites in RUQ. The gallbladder was identified, the fundus grasped and retracted cephalad. I did aspirate the Gallbladder to make it easier to retract. Adhesions were lysed bluntly and with the electrocautery where indicated, taking care not to injure any adjacent organs or viscus. The infundibulum was grasped and retracted laterally, exposing the peritoneum overlying the triangle of Calot. This was then divided and exposed in a blunt fashion. There was a fair amount of edema in this area. A critical view of the cystic duct and cystic artery was obtained.  The cystic duct was clearly identified and bluntly dissected circumferentially.The cystic was identified as well as circumferentially dissected around. There was a very small branch going off to the side toward the cystic duct and this started to bleed. Therefore I placed 3 clips proximally on the cystic artery and 1 as it entered the GB and transected it.   The cystic duct was ligated with a clip distally.  The cystic duct was enlarged. However there was no other structure entering the gallbladder.  An incision was made in the cystic duct and the [facility] cholangiogram catheter  introduced.  The catheter was secured using a clip. There was some spillage of contrast from around the catheter.  A cholangiogram was then obtained which showed good visualization of the distal and proximal biliary tree with no sign of filling defects or obstruction.  Contrast did flow into the duodenum. The catheter was then removed.   The cystic duct was then ligated with 1 clip and divided. I then placed a PDS endoloop around the cystic dump stump and 1 more clip.    The gallbladder was dissected from the liver bed in retrograde fashion with the electrocautery. The gallbladder was removed and placed in an Eco bag.  The gallbladder and Eco were then removed through the umbilical port site. The liver bed was irrigated and inspected. Hemostasis was achieved with the electrocautery. Copious irrigation was utilized and was repeatedly aspirated until clear.  The pursestring suture was used to close the umbilical fascia.  An additional 0 vicryl suture was placed at the umbilical fascia.   We again inspected the right upper quadrant for hemostasis.  The umbilical closure was inspected and there was no air leak and nothing trapped within the closure. Pneumoperitoneum was released as we removed the trocars.  4-0 Monocryl was used to close the skin.   Liquid band exceed was applied. The patient was then extubated and brought to the recovery room in stable condition. Instrument, sponge, and needle counts were correct at closure and at the conclusion of the case.   Findings: Cholecystitis with Cholelithiasis  Estimated Blood Loss: less than 50 mL         Drains: none         Specimens: Gallbladder           Complications: None; patient tolerated the procedure well.         Disposition: PACU - hemodynamically stable.         Condition: stable  Leighton Ruff. Redmond Pulling, MD, FACS General, Bariatric, & Minimally Invasive Surgery [facility], [place]

## 2014-12-04 NOTE — Progress Notes (Signed)
PROGRESS NOTE  Ray Griffin IRC:[id number] DOB: 1966/06/10 DOA: 12/03/2014 PCP: Georgetta Haber, MD  Brief history 49 year old male with a history of COPD, tobacco use, hypertension presented with abdominal pain that began on 12/03/2014. The patient had some nausea without emesis. He denied any diarrhea, hematochezia, dysuria, hematuria. He had some subjective fevers and chills. In the emergency department, workup including abdominal ultrasound revealed cholelithiasis with thickened gallbladder wall. The patient also was noted to have leukocytosis. Gen. surgery was consulted. The patient was ultimately taken to surgery for laparoscopic cholecystectomy on 12/04/2014 Assessment/Plan: Cholelithiasis with acute cholecystitis -Appreciate general surgery -12/04/2014--laparoscopic cholecystectomy -Intraoperative cholangiogram negative for any filling defects -Advance diet as tolerated -Discontinue IV fluids when the patient is able tolerate po- COPD -Stable without wheezing Tobacco abuse -Tobacco cessation discussed Hypokalemia -Replete  -Check magnesium   Family Communication:   Pt at beside Disposition Plan:   Home 12/05/14 if stable       Procedures/Studies: Dg Cholangiogram Operative  12/04/2014   CLINICAL DATA:  Cholelithiasis  EXAM: INTRAOPERATIVE CHOLANGIOGRAM  TECHNIQUE: Cholangiographic images from the C-arm fluoroscopic device were submitted for interpretation post-operatively. Please see the procedural report for the amount of contrast and the fluoroscopy time utilized.  COMPARISON:  None.  FINDINGS: Contrast fills the biliary tree and duodenum without filling defects in the common bile duct.  IMPRESSION: Patent biliary tree without evidence of common bile duct stones.   Electronically Signed   By: Marybelle Killings M.D.   On: 12/04/2014 12:56   US Abdomen Complete  12/03/2014   CLINICAL DATA:  Upper abdominal pain for 1 day.  EXAM: ULTRASOUND ABDOMEN COMPLETE   COMPARISON:  Plain films chest and abdomen this same date.  FINDINGS: Gallbladder: A large stone measuring 4.2 cm in diameter is identified in the gallbladder. The gallbladder wall measures up to 1.1 cm in thickness. The majority of the gallbladder wall demonstrates a milder degree of thickening for approximately 0.7 cm. No pericholecystic fluid is seen. A 0.8 cm stone is identified in the gallbladder neck.  Common bile duct: Diameter: 0.3 cm  Liver: No focal lesion is identified. Echotexture is mildly heterogeneous. There is no intrahepatic biliary ductal dilatation.  IVC: No abnormality visualized.  Pancreas: Visualized portion unremarkable.  Spleen: Size and appearance within normal limits.  Right Kidney: Length: 13.2 cm. Echogenicity within normal limits. No mass or hydronephrosis visualized.  Left Kidney: Length: 12.7 cm. Echogenicity within normal limits. No mass or hydronephrosis visualized.  Abdominal aorta: No aneurysm visualized.  Other findings: None.  IMPRESSION: 4.2 cm stone in the gallbladder with a 0.8 cm stone in the gallbladder neck. The gallbladder wall is thickened but the sonographer reports negative Murphy's sign and no pericholecystic fluid is identified. Findings are suggestive of cholecystitis but not definitive.   Electronically Signed   By: Inge Rise M.D.   On: 12/03/2014 21:42   Dg Abd Acute W/chest  12/03/2014   CLINICAL DATA:  Acute onset of generalized abdominal cramping for 24 hours. Initial encounter.  EXAM: ACUTE ABDOMEN SERIES (ABDOMEN 2 VIEW & CHEST 1 VIEW)  COMPARISON:  Chest radiograph performed 07/23/2014  FINDINGS: The lungs are well-aerated. Vascular congestion is noted. Bilateral central airspace opacities are increased from the prior study and may reflect mild pulmonary edema. Underlying bibasilar scarring is noted. A small left pleural effusion is seen. No pneumothorax is identified. The cardiomediastinal silhouette is borderline normal in size.  The visualized  bowel gas pattern  is unremarkable. Scattered fluid and air are seen within the colon; there is no evidence of small bowel dilatation to suggest obstruction. No free intra-abdominal air is identified on the provided upright view.  No acute osseous abnormalities are seen; the sacroiliac joints are unremarkable in appearance. Postoperative change is noted at the iliac wings bilaterally, with underlying chronic deformity.  IMPRESSION: 1. Vascular congestion noted. Bilateral central airspace opacities are increased from the prior study and may reflect mild pulmonary edema. Underlying bibasilar scarring noted. Small left pleural effusion seen. 2. Unremarkable bowel gas pattern; no free intra-abdominal air seen.   Electronically Signed   By: Garald Balding M.D.   On: 12/03/2014 21:11         Subjective: Patient denies fevers, chills, headache, chest pain, dyspnea, nausea, vomiting, diarrhea, abdominal pain, dysuria, hematuria   Objective: Filed Vitals:   12/04/14 1245 12/04/14 1300 12/04/14 1400 12/04/14 1500  BP: 128/69 127/80 123/70 118/74  Pulse: 55 58 56 58  Temp: 98.1 F (36.7 C) 98.5 F (36.9 C) 98 F (36.7 C) 98 F (36.7 C)  TempSrc:   Oral Oral  Resp: 16 17 18 18   Height:      Weight:      SpO2: 100% 97% 95% 97%    Intake/Output Summary (Last 24 hours) at 12/04/14 1757 Last data filed at 12/04/14 1540  Gross per 24 hour  Intake 2174.67 ml  Output   1570 ml  Net 604.67 ml   Weight change:  Exam:   General:  Pt is alert, follows commands appropriately, not in acute distress  HEENT: No icterus, No thrush,  Francis/AT  Cardiovascular: RRR, S1/S2, no rubs, no gallops  Respiratory: CTA bilaterally, no wheezing, no crackles, no rhonchi  Abdomen: Soft/+BS, non tender, non distended, no guarding  Extremities: No edema, No lymphangitis, No petechiae, No rashes, no synovitis  Data Reviewed: Basic Metabolic Panel:  Recent Labs Lab 12/03/14 1853 12/04/14 0130  NA 140 134*    K 3.9 3.4*  CL 103 101  CO2 29 26  GLUCOSE 106* 118*  BUN 7 8  CREATININE 0.82 0.77  CALCIUM 8.6 8.3*   Liver Function Tests:  Recent Labs Lab 12/03/14 1853 12/04/14 0130  AST 26 21  ALT 40 34  ALKPHOS 109 102  BILITOT 1.1 1.2  PROT 6.7 6.4  ALBUMIN 4.0 3.8    Recent Labs Lab 12/03/14 1853  LIPASE 23   No results for input(s): AMMONIA in the last 168 hours. CBC:  Recent Labs Lab 12/03/14 1853 12/04/14 0130  WBC 12.2* 13.5*  NEUTROABS 8.8* 9.5*  HGB 16.0 15.2  HCT 47.6 45.5  MCV 86.4 85.7  PLT 237 231   Cardiac Enzymes:  Recent Labs Lab 12/03/14 2010  TROPONINI <0.03   BNP: Invalid input(s): POCBNP CBG:  Recent Labs Lab 12/04/14 0100 12/04/14 0525  GLUCAP 104* 97    Recent Results (from the past 240 hour(s))  Surgical pcr screen     Status: Abnormal   Collection Time: 12/04/14  8:07 AM  Result Value Ref Range Status   MRSA, PCR INVALID RESULTS, SPECIMEN SENT FOR CULTURE (A) NEGATIVE Final    Comment: RESULT CALLED TO, READ BACK BY AND VERIFIED WITH: Arlyn Dunning RN AT 03.03.16 BY SHUEA    Staphylococcus aureus INVALID RESULTS, SPECIMEN SENT FOR CULTURE (A) NEGATIVE Final    Comment:        The Xpert SA Assay (FDA approved for NASAL specimens in patients over 76 years of age),  is one component of a comprehensive surveillance program.  Test performance has been validated by [facility] for patients greater than or equal to 61 year old. It is not intended to diagnose infection nor to guide or monitor treatment.      Scheduled Meds: . [START ON 12/05/2014] heparin subcutaneous  5,000 Units Subcutaneous 3 times per day  . piperacillin-tazobactam (ZOSYN)  IV  3.375 g Intravenous 3 times per day  . promethazine      . sodium chloride       Continuous Infusions: . dextrose 5 % and 0.9 % NaCl with KCl 40 mEq/L Stopped (12/04/14 0909)  . lactated ringers 100 mL/hr at 12/04/14 1748     Conor Filsaime, DO  [facility] Pager  [phone]  If 7PM-7AM, please contact night-coverage [email or web address] Password [email or web address] 12/04/2014, 5:57 PM   LOS: 0 days

## 2014-12-04 NOTE — Progress Notes (Signed)
ANTIBIOTIC CONSULT NOTE - INITIAL  Pharmacy Consult for zosyn Indication: Acute cholecystitis  No Known Allergies  Patient Measurements: Height: 6\' 1"  (185.4 cm) Weight: 237 lb 11.2 oz (107.82 kg) IBW/kg (Calculated) : 79.9 Adjusted Body Weight:   Vital Signs: Temp: 97.5 F (36.4 C) (03/03 0527) Temp Source: Oral (03/03 0527) BP: 108/61 mmHg (03/03 0527) Pulse Rate: 75 (03/03 0527) Intake/Output from previous day: 03/02 0701 - 03/03 0700 In: -  Out: 200 [Urine:200] Intake/Output from this shift: Total I/O In: -  Out: 200 [Urine:200]  Labs:  Recent Labs  12/03/14 1853 12/04/14 0130  WBC 12.2* 13.5*  HGB 16.0 15.2  PLT 237 231  CREATININE 0.82 0.77   Estimated Creatinine Clearance: 143.9 mL/min (by C-G formula based on Cr of 0.77). No results for input(s): VANCOTROUGH, VANCOPEAK, VANCORANDOM, GENTTROUGH, GENTPEAK, GENTRANDOM, TOBRATROUGH, TOBRAPEAK, TOBRARND, AMIKACINPEAK, AMIKACINTROU, AMIKACIN in the last 72 hours.   Microbiology: No results found for this or any previous visit (from the past 720 hour(s)).  Medical History: Past Medical History  Diagnosis Date  . COPD (chronic obstructive pulmonary disease)   . Anxiety   . Emphysema   . Hypertension   . Tachycardia     Medications:  Anti-infectives    Start     Dose/Rate Route Frequency Ordered Stop   12/04/14 0115  piperacillin-tazobactam (ZOSYN) IVPB 3.375 g     3.375 g 12.5 mL/hr over 240 Minutes Intravenous 3 times per day 12/04/14 0107       Assessment: Patient in ED for abdominal pain.  PMhx COPD, tobacco abuse, HTN.  First dose of antibiotics already given.  Pharmacy to dose zosyn for Acute cholecystitis.  Goal of Therapy:  Zosyn based on renal function    Plan:  Follow up culture results  Zosyn 3.375g IV Q8H infused over 4hrs.   Tyler Deis, Shea Stakes Crowford 12/04/2014,5:50 AM

## 2014-12-04 NOTE — Progress Notes (Signed)
Subjective: Feels a little better with pain meds. No n/v now. Denies cp/sob/tia/amaruosis fugax. Some DOE. Denies cirrhosis. States LFTs were elevated about 1 year just before stop drinking. Relates prior episodes of epigastric/ruq pain in past but not as intense as episode this admission  Objective: Vital signs in last 24 hours: Temp:  [97.5 F (36.4 C)-98.1 F (36.7 C)] 97.5 F (36.4 C) (03/03 0527) Pulse Rate:  [56-84] 75 (03/03 0527) Resp:  [17-18] 18 (03/03 0527) BP: (108-151)/(61-83) 108/61 mmHg (03/03 0527) SpO2:  [94 %-100 %] 96 % (03/03 0527) Weight:  [237 lb 11.2 oz (107.82 kg)-250 lb (113.399 kg)] 237 lb 11.2 oz (107.82 kg) (03/03 0527)   NPO Afebrile, VSS K+ 3.4 WBC 13.5 [place]:  4.2 cm stone in the gallbladder with a 0.8 cm stone in the gallbladder neck. The gallbladder wall is thickened but the sonographer reports negative Murphy's sign and no pericholecystic fluid is identified. Findings are suggestive of cholecystitis but not definitive. Intake/Output from previous day: 03/02 0701 - 03/03 0700 In: -  Out: 200 [Urine:200] Intake/Output this shift:    Alert, nad Facial scars cta b/l Soft, nd, very mild RUQ TTP  Lab Results:   Recent Labs  12/03/14 1853 12/04/14 0130  WBC 12.2* 13.5*  HGB 16.0 15.2  HCT 47.6 45.5  PLT 237 231    BMET  Recent Labs  12/03/14 1853 12/04/14 0130  NA 140 134*  K 3.9 3.4*  CL 103 101  CO2 29 26  GLUCOSE 106* 118*  BUN 7 8  CREATININE 0.82 0.77  CALCIUM 8.6 8.3*   PT/INR No results for input(s): LABPROT, INR in the last 72 hours.   Recent Labs Lab 12/03/14 1853 12/04/14 0130  AST 26 21  ALT 40 34  ALKPHOS 109 102  BILITOT 1.1 1.2  PROT 6.7 6.4  ALBUMIN 4.0 3.8     Lipase     Component Value Date/Time   LIPASE 23 12/03/2014 1853     Studies/Results: US Abdomen Complete  12/03/2014   CLINICAL DATA:  Upper abdominal pain for 1 day.  EXAM: ULTRASOUND ABDOMEN COMPLETE  COMPARISON:  Plain films  chest and abdomen this same date.  FINDINGS: Gallbladder: A large stone measuring 4.2 cm in diameter is identified in the gallbladder. The gallbladder wall measures up to 1.1 cm in thickness. The majority of the gallbladder wall demonstrates a milder degree of thickening for approximately 0.7 cm. No pericholecystic fluid is seen. A 0.8 cm stone is identified in the gallbladder neck.  Common bile duct: Diameter: 0.3 cm  Liver: No focal lesion is identified. Echotexture is mildly heterogeneous. There is no intrahepatic biliary ductal dilatation.  IVC: No abnormality visualized.  Pancreas: Visualized portion unremarkable.  Spleen: Size and appearance within normal limits.  Right Kidney: Length: 13.2 cm. Echogenicity within normal limits. No mass or hydronephrosis visualized.  Left Kidney: Length: 12.7 cm. Echogenicity within normal limits. No mass or hydronephrosis visualized.  Abdominal aorta: No aneurysm visualized.  Other findings: None.  IMPRESSION: 4.2 cm stone in the gallbladder with a 0.8 cm stone in the gallbladder neck. The gallbladder wall is thickened but the sonographer reports negative Murphy's sign and no pericholecystic fluid is identified. Findings are suggestive of cholecystitis but not definitive.   Electronically Signed   By: Inge Rise M.D.   On: 12/03/2014 21:42   Dg Abd Acute W/chest  12/03/2014   CLINICAL DATA:  Acute onset of generalized abdominal cramping for 24 hours. Initial encounter.  EXAM:  ACUTE ABDOMEN SERIES (ABDOMEN 2 VIEW & CHEST 1 VIEW)  COMPARISON:  Chest radiograph performed 07/23/2014  FINDINGS: The lungs are well-aerated. Vascular congestion is noted. Bilateral central airspace opacities are increased from the prior study and may reflect mild pulmonary edema. Underlying bibasilar scarring is noted. A small left pleural effusion is seen. No pneumothorax is identified. The cardiomediastinal silhouette is borderline normal in size.  The visualized bowel gas pattern is  unremarkable. Scattered fluid and air are seen within the colon; there is no evidence of small bowel dilatation to suggest obstruction. No free intra-abdominal air is identified on the provided upright view.  No acute osseous abnormalities are seen; the sacroiliac joints are unremarkable in appearance. Postoperative change is noted at the iliac wings bilaterally, with underlying chronic deformity.  IMPRESSION: 1. Vascular congestion noted. Bilateral central airspace opacities are increased from the prior study and may reflect mild pulmonary edema. Underlying bibasilar scarring noted. Small left pleural effusion seen. 2. Unremarkable bowel gas pattern; no free intra-abdominal air seen.   Electronically Signed   By: Garald Balding M.D.   On: 12/03/2014 21:11    Medications: . piperacillin-tazobactam (ZOSYN)  IV  3.375 g Intravenous 3 times per day   . dextrose 5 % and 0.9 % NaCl with KCl 40 mEq/L     Prior to Admission medications   Medication Sig Start Date End Date Taking? Authorizing Provider  albuterol (PROVENTIL HFA;VENTOLIN HFA) 108 (90 BASE) MCG/ACT inhaler Inhale 1-2 puffs into the lungs every 6 (six) hours as needed for wheezing or shortness of breath. Patient not taking: Reported on 12/03/2014 07/23/14   Charlesetta Shanks, MD  predniSONE (DELTASONE) 20 MG tablet 3 tabs po day one, then 2 tabs daily x 4 days Patient not taking: Reported on 12/03/2014 07/23/14   Charlesetta Shanks, MD  sulfamethoxazole-trimethoprim (SEPTRA DS) 800-160 MG per tablet Take 1 tablet by mouth 2 (two) times daily. Patient not taking: Reported on 12/03/2014 07/23/14   Charlesetta Shanks, MD    Assessment/Plan  cholecystitis Abdominal pain/nausea 4.2 cm gallstone Hx of spontaneous pneumothorax Substance-induced mood disorder Hx of Alcohol addiction - no drinking since April of last year COPD/Tobacco use Anxiety Hypertension  Has mild cholecystitis on u/s but size of gallstone reason enough alone for cholecystectomy.   I  believe the patient's symptoms are consistent with gallbladder disease.  We discussed gallbladder disease.  I discussed laparoscopic cholecystectomy with possible IOC in detail. .  We discussed the risks and benefits of a laparoscopic cholecystectomy including, but not limited to bleeding, infection, injury to surrounding structures such as the intestine or liver, bile leak, retained gallstones, need to convert to an open procedure, prolonged diarrhea, blood clots such as  DVT, common bile duct injury, anesthesia risks, and possible need for additional procedures.  We discussed the typical post-operative recovery course. I explained that the likelihood of improvement of their symptoms is good.  Leighton Ruff. Redmond Pulling, MD, FACS General, Bariatric, & Minimally Invasive Surgery [facility], [place]     LOS: 0 days     12/04/2014

## 2014-12-04 NOTE — Anesthesia Postprocedure Evaluation (Signed)
  Anesthesia Post-op Note  Patient: Ray Griffin  Procedure(s) Performed: Procedure(s): LAPAROSCOPIC CHOLECYSTECTOMY WITH POSSIBLE INTRAOPERATIVE CHOLANGIOGRAM (N/A)  Patient Location: PACU  Anesthesia Type:General  Level of Consciousness: awake and alert   Airway and Oxygen Therapy: Patient Spontanous Breathing  Post-op Pain: mild  Post-op Assessment: Post-op Vital signs reviewed, Patient's Cardiovascular Status Stable, Respiratory Function Stable, Patent Airway, No signs of Nausea or vomiting and Pain level controlled  Post-op Vital Signs: Reviewed and stable  Last Vitals:  Filed Vitals:   12/04/14 1300  BP: 127/80  Pulse: 58  Temp: 36.9 C  Resp: 17    Complications: No apparent anesthesia complications

## 2014-12-04 NOTE — Anesthesia Procedure Notes (Signed)
Procedure Name: Intubation Date/Time: 12/04/2014 10:17 AM Performed by: Johnathan Hausen A Pre-anesthesia Checklist: Patient identified, Timeout performed, Emergency Drugs available, Suction available and Patient being monitored Patient Re-evaluated:Patient Re-evaluated prior to inductionOxygen Delivery Method: Circle system utilized Preoxygenation: Pre-oxygenation with 100% oxygen Intubation Type: Combination inhalational/ intravenous induction Ventilation: Mask ventilation without difficulty Laryngoscope Size: Mac and 4 Grade View: Grade I Tube type: Oral Tube size: 8.0 mm Number of attempts: 1 Airway Equipment and Method: Stylet Placement Confirmation: ETT inserted through vocal cords under direct vision,  positive ETCO2 and breath sounds checked- equal and bilateral Secured at: 22 cm Tube secured with: Tape Dental Injury: Teeth and Oropharynx as per pre-operative assessment

## 2014-12-04 NOTE — Anesthesia Preprocedure Evaluation (Signed)
Anesthesia Evaluation  Patient identified by MRN, date of birth, ID band Patient awake    Reviewed: Allergy & Precautions, NPO status , Patient's Chart, lab work & pertinent test results  History of Anesthesia Complications Negative for: history of anesthetic complications  Airway Mallampati: II  TM Distance: >3 FB Neck ROM: Full    Dental  (+) Poor Dentition,    Pulmonary COPD COPD inhaler, former smoker,  breath sounds clear to auscultation        Cardiovascular hypertension, Rhythm:Regular     Neuro/Psych PSYCHIATRIC DISORDERS Anxiety Depression negative neurological ROS     GI/Hepatic negative GI ROS, Neg liver ROS,   Endo/Other  negative endocrine ROS  Renal/GU negative Renal ROS     Musculoskeletal   Abdominal   Peds  Hematology negative hematology ROS (+)   Anesthesia Other Findings Substance abuse history  Reproductive/Obstetrics                             Anesthesia Physical Anesthesia Plan  ASA: III  Anesthesia Plan: General   Post-op Pain Management:    Induction: Intravenous  Airway Management Planned: Oral ETT  Additional Equipment: None  Intra-op Plan:   Post-operative Plan: Extubation in OR  Informed Consent: I have reviewed the patients History and Physical, chart, labs and discussed the procedure including the risks, benefits and alternatives for the proposed anesthesia with the patient or authorized representative who has indicated his/her understanding and acceptance.   Dental advisory given  Plan Discussed with: CRNA and Surgeon  Anesthesia Plan Comments:         Anesthesia Quick Evaluation

## 2014-12-04 NOTE — H&P (Signed)
[facility] History and Physical  Ray Griffin:[id number] DOB: 1966-08-20 DOA: 12/03/2014  Referring physician: ER physician. PCP: Georgetta Haber, MD   Chief Complaint: Abdominal pain.  HPI: Ray Griffin is a 49 y.o. male with history of COPD, tobacco abuse and hypertension (presently on no medications) presents with ER because of abdominal pain. Patient states that he has been abdominal pain across the upper quadrants since yesterday. Has been having some nausea. Denies any diarrhea. In the ER sonogram shows Features concerning for cholecystitis and on exam patient has right upper quadrant tenderness. On-call surgeon was consulted and patient has been admitted for further management. Patient denies any chest pain or shortness of breath.   Review of Systems: As presented in the history of presenting illness, rest negative.  Past Medical History  Diagnosis Date  . COPD (chronic obstructive pulmonary disease)   . Anxiety   . Emphysema   . Hypertension   . Tachycardia    Past Surgical History  Procedure Laterality Date  . Facial cosmetic surgery    . Thoracotomy     Social History:  reports that he quit smoking about 3 years ago. He has never used smokeless tobacco. He reports that he does not drink alcohol or use illicit drugs. Where does patient live at home. Can patient participate in ADLs? Yes.  No Known Allergies  Family History:  Family History  Problem Relation Age of Onset  . Diabetes Mother   . Heart failure Mother   . Diabetes Father   . Heart failure Father   . Bipolar disorder Brother       Prior to Admission medications   Medication Sig Start Date End Date Taking? Authorizing Provider  albuterol (PROVENTIL HFA;VENTOLIN HFA) 108 (90 BASE) MCG/ACT inhaler Inhale 1-2 puffs into the lungs every 6 (six) hours as needed for wheezing or shortness of breath. Patient not taking: Reported on 12/03/2014 07/23/14   Charlesetta Shanks, MD  predniSONE  (DELTASONE) 20 MG tablet 3 tabs po day one, then 2 tabs daily x 4 days Patient not taking: Reported on 12/03/2014 07/23/14   Charlesetta Shanks, MD  sulfamethoxazole-trimethoprim (SEPTRA DS) 800-160 MG per tablet Take 1 tablet by mouth 2 (two) times daily. Patient not taking: Reported on 12/03/2014 07/23/14   Charlesetta Shanks, MD    Physical Exam: Filed Vitals:   12/03/14 1759 12/03/14 2216 12/04/14 0005  BP: 151/83 118/61 121/62  Pulse: 62 84 56  Temp: 98.1 F (36.7 C) 97.9 F (36.6 C) 97.9 F (36.6 C)  TempSrc: Oral Oral Oral  Resp: 18 17 18   Weight: 113.399 kg (250 lb)    SpO2: 97% 94% 100%     General:  Well-developed and nourished.  Eyes: Anicteric no pallor.  ENT: No discharge from the ears eyes nose or mouth.  Neck: No mass felt.  Cardiovascular: S1 and S2 heard.  Respiratory: No rhonchi or crepitations.  Abdomen: Mild tenderness in the right upper quadrant.  Skin: No rash.  Musculoskeletal: No edema.  Psychiatric: Appears normal.  Neurologic: Alert awake oriented to time place and person. Moves all extremities.  Labs on Admission:  Basic Metabolic Panel:  Recent Labs Lab 12/03/14 1853  NA 140  K 3.9  CL 103  CO2 29  GLUCOSE 106*  BUN 7  CREATININE 0.82  CALCIUM 8.6   Liver Function Tests:  Recent Labs Lab 12/03/14 1853  AST 26  ALT 40  ALKPHOS 109  BILITOT 1.1  PROT 6.7  ALBUMIN 4.0  Recent Labs Lab 12/03/14 1853  LIPASE 23   No results for input(s): AMMONIA in the last 168 hours. CBC:  Recent Labs Lab 12/03/14 1853  WBC 12.2*  NEUTROABS 8.8*  HGB 16.0  HCT 47.6  MCV 86.4  PLT 237   Cardiac Enzymes:  Recent Labs Lab 12/03/14 2010  TROPONINI <0.03    BNP (last 3 results) No results for input(s): BNP in the last 8760 hours.  ProBNP (last 3 results) No results for input(s): PROBNP in the last 8760 hours.  CBG: No results for input(s): GLUCAP in the last 168 hours.  Radiological Exams on Admission: US Abdomen  Complete  12/03/2014   CLINICAL DATA:  Upper abdominal pain for 1 day.  EXAM: ULTRASOUND ABDOMEN COMPLETE  COMPARISON:  Plain films chest and abdomen this same date.  FINDINGS: Gallbladder: A large stone measuring 4.2 cm in diameter is identified in the gallbladder. The gallbladder wall measures up to 1.1 cm in thickness. The majority of the gallbladder wall demonstrates a milder degree of thickening for approximately 0.7 cm. No pericholecystic fluid is seen. A 0.8 cm stone is identified in the gallbladder neck.  Common bile duct: Diameter: 0.3 cm  Liver: No focal lesion is identified. Echotexture is mildly heterogeneous. There is no intrahepatic biliary ductal dilatation.  IVC: No abnormality visualized.  Pancreas: Visualized portion unremarkable.  Spleen: Size and appearance within normal limits.  Right Kidney: Length: 13.2 cm. Echogenicity within normal limits. No mass or hydronephrosis visualized.  Left Kidney: Length: 12.7 cm. Echogenicity within normal limits. No mass or hydronephrosis visualized.  Abdominal aorta: No aneurysm visualized.  Other findings: None.  IMPRESSION: 4.2 cm stone in the gallbladder with a 0.8 cm stone in the gallbladder neck. The gallbladder wall is thickened but the sonographer reports negative Murphy's sign and no pericholecystic fluid is identified. Findings are suggestive of cholecystitis but not definitive.   Electronically Signed   By: Inge Rise M.D.   On: 12/03/2014 21:42   Dg Abd Acute W/chest  12/03/2014   CLINICAL DATA:  Acute onset of generalized abdominal cramping for 24 hours. Initial encounter.  EXAM: ACUTE ABDOMEN SERIES (ABDOMEN 2 VIEW & CHEST 1 VIEW)  COMPARISON:  Chest radiograph performed 07/23/2014  FINDINGS: The lungs are well-aerated. Vascular congestion is noted. Bilateral central airspace opacities are increased from the prior study and may reflect mild pulmonary edema. Underlying bibasilar scarring is noted. A small left pleural effusion is seen. No  pneumothorax is identified. The cardiomediastinal silhouette is borderline normal in size.  The visualized bowel gas pattern is unremarkable. Scattered fluid and air are seen within the colon; there is no evidence of small bowel dilatation to suggest obstruction. No free intra-abdominal air is identified on the provided upright view.  No acute osseous abnormalities are seen; the sacroiliac joints are unremarkable in appearance. Postoperative change is noted at the iliac wings bilaterally, with underlying chronic deformity.  IMPRESSION: 1. Vascular congestion noted. Bilateral central airspace opacities are increased from the prior study and may reflect mild pulmonary edema. Underlying bibasilar scarring noted. Small left pleural effusion seen. 2. Unremarkable bowel gas pattern; no free intra-abdominal air seen.   Electronically Signed   By: Garald Balding M.D.   On: 12/03/2014 21:11    EKG: Independently reviewed. Sinus bradycardia with rate around 54 bpm  Assessment/Plan Principal Problem:   Acute cholecystitis Active Problems:   Gallstones   COPD (chronic obstructive pulmonary disease)   1. Acute cholecystitis - on-call surgeon Dr. Marcello Moores has  been consulted. Patient will be kept nothing by mouth in anticipation of possible surgery. Antibiotics. Pain relief medications and gentle hydration. 2. History of COPD - presently not wheezing. When necessary albuterol. Patient's chest x-ray does show some congestion but patient denies any shortness of breath. Check BNP. 3. History of hypertension - presently not taking any medications. Closely follow blood pressure trends and patient has been placed on when necessary IV hydralazine. 4. Tobacco abuse - tobacco cessation counseling requested.   DVT Prophylaxis SCD.  Code Status: Full code.  Family Communication: None.  Disposition Plan: Admit to inpatient.    Beola Vasallo N. [facility] Pager [phone].  If 7PM-7AM, please contact  night-coverage [email or web address] Password [email or web address] 12/04/2014, 12:51 AM

## 2014-12-04 NOTE — Transfer of Care (Signed)
Immediate Anesthesia Transfer of Care Note  Patient: Ray Griffin  Procedure(s) Performed: Procedure(s): LAPAROSCOPIC CHOLECYSTECTOMY WITH POSSIBLE INTRAOPERATIVE CHOLANGIOGRAM (N/A)  Patient Location: PACU  Anesthesia Type:General  Level of Consciousness: awake, sedated and patient cooperative  Airway & Oxygen Therapy: Patient Spontanous Breathing and Patient connected to face mask oxygen  Post-op Assessment: Report given to RN and Post -op Vital signs reviewed and stable  Post vital signs: Reviewed and stable  Last Vitals:  Filed Vitals:   12/04/14 0803  BP: 113/67  Pulse: 62  Temp: 36.7 C  Resp: 18    Complications: No apparent anesthesia complications

## 2014-12-05 ENCOUNTER — Encounter (HOSPITAL_COMMUNITY): Payer: Self-pay | Admitting: General Surgery

## 2014-12-05 LAB — GLUCOSE, CAPILLARY
Glucose-Capillary: 132 mg/dL — ABNORMAL HIGH (ref 70–99)
Glucose-Capillary: 134 mg/dL — ABNORMAL HIGH (ref 70–99)
Glucose-Capillary: 166 mg/dL — ABNORMAL HIGH (ref 70–99)

## 2014-12-05 LAB — CBC
HCT: 45.8 % (ref 39.0–52.0)
Hemoglobin: 14.9 g/dL (ref 13.0–17.0)
MCH: 28.2 pg (ref 26.0–34.0)
MCHC: 32.5 g/dL (ref 30.0–36.0)
MCV: 86.6 fL (ref 78.0–100.0)
Platelets: 223 10*3/uL (ref 150–400)
RBC: 5.29 MIL/uL (ref 4.22–5.81)
RDW: 13.5 % (ref 11.5–15.5)
WBC: 18.1 10*3/uL — ABNORMAL HIGH (ref 4.0–10.5)

## 2014-12-05 LAB — BASIC METABOLIC PANEL
Anion gap: 7 (ref 5–15)
BUN: 10 mg/dL (ref 6–23)
CO2: 26 mmol/L (ref 19–32)
Calcium: 8.6 mg/dL (ref 8.4–10.5)
Chloride: 103 mmol/L (ref 96–112)
Creatinine, Ser: 0.94 mg/dL (ref 0.50–1.35)
GFR calc Af Amer: >90 mL/min (ref >90)
GFR calc non Af Amer: >90 mL/min (ref >90)
Glucose, Bld: 135 mg/dL — ABNORMAL HIGH (ref 70–99)
Potassium: 4.1 mmol/L (ref 3.5–5.1)
Sodium: 136 mmol/L (ref 135–145)

## 2014-12-05 NOTE — Discharge Instructions (Signed)
[facility] ______[facility] °LAPAROSCOPIC SURGERY: POST OP INSTRUCTIONS °Always review your discharge instruction sheet given to you by the facility where your surgery was performed. °IF YOU HAVE DISABILITY OR FAMILY LEAVE FORMS, YOU MUST BRING THEM TO THE OFFICE FOR PROCESSING.   °DO NOT GIVE THEM TO YOUR DOCTOR. ° °1. A prescription for pain medication may be given to you upon discharge.  Take your pain medication as prescribed, if needed.  If narcotic pain medicine is not needed, then you may take acetaminophen (Tylenol) or ibuprofen (Advil) as needed. °2. Take your usually prescribed medications unless otherwise directed. °3. If you need a refill on your pain medication, please contact your pharmacy.  They will contact our office to request authorization. Prescriptions will not be filled after 5pm or on week-ends. °4. You should follow a light diet the first few days after arrival home, such as soup and crackers, etc.  Be sure to include lots of fluids daily. °5. Most patients will experience some swelling and bruising in the area of the incisions.  Ice packs will help.  Swelling and bruising can take several days to resolve.  °6. It is common to experience some constipation if taking pain medication after surgery.  Increasing fluid intake and taking a stool softener (such as Colace) will usually help or prevent this problem from occurring.  A mild laxative (Milk of Magnesia or Miralax) should be taken according to package instructions if there are no bowel movements after 48 hours. °7. Unless discharge instructions indicate otherwise, you may remove your bandages 24-48 hours after surgery, and you may shower at that time.  You may have steri-strips (small skin tapes) in place directly over the incision.  These strips should be left on the skin for 7-10 days.  If your surgeon used skin glue on the incision, you may shower in 24 hours.  The glue will flake off over the next 2-3 weeks.  Any sutures or  staples will be removed at the office during your follow-up visit. °8. ACTIVITIES:  You may resume regular (light) daily activities beginning the next day--such as daily self-care, walking, climbing stairs--gradually increasing activities as tolerated.  You may have sexual intercourse when it is comfortable.  Refrain from any heavy lifting or straining until approved by your doctor. °a. You may drive when you are no longer taking prescription pain medication, you can comfortably wear a seatbelt, and you can safely maneuver your car and apply brakes. °b. RETURN TO WORK:  __________________________________________________________ °9. You should see your doctor in the office for a follow-up appointment approximately 2-3 weeks after your surgery.  Make sure that you call for this appointment within a day or two after you arrive home to insure a convenient appointment time. °10. OTHER INSTRUCTIONS: __________________________________________________________________________________________________________________________ __________________________________________________________________________________________________________________________ °WHEN TO CALL YOUR DOCTOR: °1. Fever over 101.0 °2. Inability to urinate °3. Continued bleeding from incision. °4. Increased pain, redness, or drainage from the incision. °5. Increasing abdominal pain ° °The clinic staff is available to answer your questions during regular business hours.  Please don’t hesitate to call and ask to speak to one of the nurses for clinical concerns.  If you have a medical emergency, go to the nearest emergency room or call 911.  A surgeon from [facility] is always on call at the hospital. °[address], [address], [place], [place]  [address] ? [address] [address], [place], [place]   [address] °[phone] ? [phone] ? FAX [phone] °Web site:   [email or web address] °

## 2014-12-05 NOTE — Progress Notes (Signed)
Patient ID: Ray Griffin, male   DOB: 09-Nov-1965, 49 y.o.   MRN: [id number] 1 Day Post-Op  Subjective: Pt feels well today.  Pain controlled.  Tolerating a regular diet.  Objective: Vital signs in last 24 hours: Temp:  [97.8 F (36.6 C)-98.9 F (37.2 C)] 98.8 F (37.1 C) (03/04 0540) Pulse Rate:  [48-70] 61 (03/04 0540) Resp:  [16-20] 20 (03/04 0540) BP: (103-139)/(57-80) 103/60 mmHg (03/04 0540) SpO2:  [95 %-100 %] 95 % (03/04 0540)    Intake/Output from previous day: 03/03 0701 - 03/04 0700 In: 4574.7 [P.O.:838; I.V.:3586.7; IV Piggyback:150] Out: 2070 [Urine:2070] Intake/Output this shift:    PE: Abd: soft, appropriately tender, +BS, ND, incisions c/d/i  Lab Results:   Recent Labs  12/04/14 0130 12/05/14 0450  WBC 13.5* 18.1*  HGB 15.2 14.9  HCT 45.5 45.8  PLT 231 223   BMET  Recent Labs  12/04/14 0130 12/05/14 0450  NA 134* 136  K 3.4* 4.1  CL 101 103  CO2 26 26  GLUCOSE 118* 135*  BUN 8 10  CREATININE 0.77 0.94  CALCIUM 8.3* 8.6   PT/INR  Recent Labs  12/04/14 0850  LABPROT 14.4  INR 1.11   CMP     Component Value Date/Time   NA 136 12/05/2014 0450   K 4.1 12/05/2014 0450   CL 103 12/05/2014 0450   CO2 26 12/05/2014 0450   GLUCOSE 135* 12/05/2014 0450   BUN 10 12/05/2014 0450   CREATININE 0.94 12/05/2014 0450   CALCIUM 8.6 12/05/2014 0450   PROT 6.4 12/04/2014 0130   ALBUMIN 3.8 12/04/2014 0130   AST 21 12/04/2014 0130   ALT 34 12/04/2014 0130   ALKPHOS 102 12/04/2014 0130   BILITOT 1.2 12/04/2014 0130   GFRNONAA >90 12/05/2014 0450   GFRAA >90 12/05/2014 0450   Lipase     Component Value Date/Time   LIPASE 23 12/03/2014 1853       Studies/Results: Dg Cholangiogram Operative  12/04/2014   CLINICAL DATA:  Cholelithiasis  EXAM: INTRAOPERATIVE CHOLANGIOGRAM  TECHNIQUE: Cholangiographic images from the C-arm fluoroscopic device were submitted for interpretation post-operatively. Please see the procedural report for the  amount of contrast and the fluoroscopy time utilized.  COMPARISON:  None.  FINDINGS: Contrast fills the biliary tree and duodenum without filling defects in the common bile duct.  IMPRESSION: Patent biliary tree without evidence of common bile duct stones.   Electronically Signed   By: Marybelle Killings M.D.   On: 12/04/2014 12:56   US Abdomen Complete  12/03/2014   CLINICAL DATA:  Upper abdominal pain for 1 day.  EXAM: ULTRASOUND ABDOMEN COMPLETE  COMPARISON:  Plain films chest and abdomen this same date.  FINDINGS: Gallbladder: A large stone measuring 4.2 cm in diameter is identified in the gallbladder. The gallbladder wall measures up to 1.1 cm in thickness. The majority of the gallbladder wall demonstrates a milder degree of thickening for approximately 0.7 cm. No pericholecystic fluid is seen. A 0.8 cm stone is identified in the gallbladder neck.  Common bile duct: Diameter: 0.3 cm  Liver: No focal lesion is identified. Echotexture is mildly heterogeneous. There is no intrahepatic biliary ductal dilatation.  IVC: No abnormality visualized.  Pancreas: Visualized portion unremarkable.  Spleen: Size and appearance within normal limits.  Right Kidney: Length: 13.2 cm. Echogenicity within normal limits. No mass or hydronephrosis visualized.  Left Kidney: Length: 12.7 cm. Echogenicity within normal limits. No mass or hydronephrosis visualized.  Abdominal aorta: No aneurysm visualized.  Other findings: None.  IMPRESSION: 4.2 cm stone in the gallbladder with a 0.8 cm stone in the gallbladder neck. The gallbladder wall is thickened but the sonographer reports negative Murphy's sign and no pericholecystic fluid is identified. Findings are suggestive of cholecystitis but not definitive.   Electronically Signed   By: Inge Rise M.D.   On: 12/03/2014 21:42   Dg Abd Acute W/chest  12/03/2014   CLINICAL DATA:  Acute onset of generalized abdominal cramping for 24 hours. Initial encounter.  EXAM: ACUTE ABDOMEN SERIES  (ABDOMEN 2 VIEW & CHEST 1 VIEW)  COMPARISON:  Chest radiograph performed 07/23/2014  FINDINGS: The lungs are well-aerated. Vascular congestion is noted. Bilateral central airspace opacities are increased from the prior study and may reflect mild pulmonary edema. Underlying bibasilar scarring is noted. A small left pleural effusion is seen. No pneumothorax is identified. The cardiomediastinal silhouette is borderline normal in size.  The visualized bowel gas pattern is unremarkable. Scattered fluid and air are seen within the colon; there is no evidence of small bowel dilatation to suggest obstruction. No free intra-abdominal air is identified on the provided upright view.  No acute osseous abnormalities are seen; the sacroiliac joints are unremarkable in appearance. Postoperative change is noted at the iliac wings bilaterally, with underlying chronic deformity.  IMPRESSION: 1. Vascular congestion noted. Bilateral central airspace opacities are increased from the prior study and may reflect mild pulmonary edema. Underlying bibasilar scarring noted. Small left pleural effusion seen. 2. Unremarkable bowel gas pattern; no free intra-abdominal air seen.   Electronically Signed   By: Garald Balding M.D.   On: 12/03/2014 21:11    Anti-infectives: Anti-infectives    Start     Dose/Rate Route Frequency Ordered Stop   12/04/14 1400  piperacillin-tazobactam (ZOSYN) IVPB 3.375 g     3.375 g 12.5 mL/hr over 240 Minutes Intravenous 3 times per day 12/04/14 1306 12/05/14 0927   12/04/14 0115  piperacillin-tazobactam (ZOSYN) IVPB 3.375 g  Status:  Discontinued     3.375 g 12.5 mL/hr over 240 Minutes Intravenous 3 times per day 12/04/14 0107 12/04/14 1306       Assessment/Plan  1. POD 1, s/p lap chole -ok for DC home from our standpoint -follow up arranged for him in our clinic in 3 weeks  -he does not want narcotics at discharge due to his history.  LOS: 1 day    Lillyn Wieczorek E 12/05/2014, 9:36 AM Pager:  [phone]

## 2014-12-05 NOTE — Progress Notes (Signed)
Discharge instructions reviewed with patient, belongings returned and signed for, incisions are within normal limits, patient is tolerating his diet without complaints of nausea or vomiting, pain controlled adequately, patient to follow up with MD, questions and concerns answered Neta Mends RN 12-05-2014 12:51pm

## 2014-12-05 NOTE — Discharge Summary (Signed)
Physician Discharge Summary  HANI CAMPUSANO JJO:[id number] DOB: June 06, 1966 DOA: 12/03/2014  PCP: Georgetta Haber, MD  Admit date: 12/03/2014 Discharge date: 12/05/2014  Recommendations for Outpatient Follow-up:  1. Pt will need to follow up with PCP in 2 weeks post discharge 2. Follow up with General Surgery on 12/23/14 at 315PM  Discharge Diagnoses:  Cholelithiasis with acute cholecystitis -Appreciate general surgery -12/04/2014--laparoscopic cholecystectomy -Intraoperative cholangiogram negative for any filling defects -Advance diet as tolerated -Discontinue IV fluids when the patient is able tolerate po -diet was advanced and pt tolerated it  -on day of d/c pt was afebrile and hemodynamically stable and stated his abd pain was much better than prior to surgery -pt did not want any opioid for pain--he can take acetaminophen or ibuprofen in the short term COPD -Stable without wheezing Tobacco abuse -Tobacco cessation discussed Hypokalemia -Repleted   Discharge Condition: stable  Disposition:      Follow-up Information    Follow up with [facility] On 12/23/2014.   Why:  Doc of the [facility], 3:15pm, arrive no later than 2:45pm for paperwork   Contact information:   [place] [address] [phone]       Diet:regular Wt Readings from Last 3 Encounters:  12/04/14 107.82 kg (237 lb 11.2 oz)  06/02/13 108.863 kg (240 lb)  08/26/11 102.059 kg (225 lb)    History of present illness:  49 year old male with a history of COPD, tobacco use, hypertension presented with abdominal pain that began on 12/03/2014. The patient had some nausea without emesis. He denied any diarrhea, hematochezia, dysuria, hematuria. He had some subjective fevers and chills. In the emergency department, workup including abdominal ultrasound revealed cholelithiasis with thickened gallbladder wall. The patient also was noted to have leukocytosis. Gen. surgery  was consulted. The patient was ultimately taken to surgery for laparoscopic cholecystectomy on 12/04/2014. Postoperatively, the patient stated that his pain in his abdomen improved. His diet was advanced which she tolerated. The patient remained afebrile and hemodynamically stable. He did not want any opioids for discharge for his pain. The patient was instructed to use ibuprofen and acetaminophen in the short-term for his pain. He has follow-up scheduled with general surgery on 12/23/2014 at 3:15 PM.    Consultants: [facility]  Discharge Exam: Filed Vitals:   12/05/14 0540  BP: 103/60  Pulse: 61  Temp: 98.8 F (37.1 C)  Resp: 20   Filed Vitals:   12/04/14 1500 12/04/14 1600 12/04/14 2020 12/05/14 0540  BP: 118/74 107/64 109/57 103/60  Pulse: 58 67 61 61  Temp: 98 F (36.7 C) 98.9 F (37.2 C) 98.2 F (36.8 C) 98.8 F (37.1 C)  TempSrc: Oral Oral Oral Oral  Resp: 18 18 20 20   Height:      Weight:      SpO2: 97% 97% 97% 95%   General: A&O x 3, NAD, pleasant, cooperative Cardiovascular: RRR, no rub, no gallop, no S3 Respiratory: CTAB, no wheeze, no rhonchi Abdomen:soft, mild incisional tenderness without any rebound, nondistended, positive bowel sounds Extremities: No edema, No lymphangitis, no petechiae  Discharge Instructions     Medication List    STOP taking these medications        predniSONE 20 MG tablet  Commonly known as:  DELTASONE     sulfamethoxazole-trimethoprim 800-160 MG per tablet  Commonly known as:  SEPTRA DS      TAKE these medications        albuterol 108 (90 BASE) MCG/ACT inhaler  Commonly known as:  PROVENTIL HFA;VENTOLIN HFA  Inhale 1-2 puffs into the lungs every 6 (six) hours as needed for wheezing or shortness of breath.         The results of significant diagnostics from this hospitalization (including imaging, microbiology, ancillary and laboratory) are listed below for reference.    Significant Diagnostic Studies: Dg Cholangiogram  Operative  12/04/2014   CLINICAL DATA:  Cholelithiasis  EXAM: INTRAOPERATIVE CHOLANGIOGRAM  TECHNIQUE: Cholangiographic images from the C-arm fluoroscopic device were submitted for interpretation post-operatively. Please see the procedural report for the amount of contrast and the fluoroscopy time utilized.  COMPARISON:  None.  FINDINGS: Contrast fills the biliary tree and duodenum without filling defects in the common bile duct.  IMPRESSION: Patent biliary tree without evidence of common bile duct stones.   Electronically Signed   By: Marybelle Killings M.D.   On: 12/04/2014 12:56   US Abdomen Complete  12/03/2014   CLINICAL DATA:  Upper abdominal pain for 1 day.  EXAM: ULTRASOUND ABDOMEN COMPLETE  COMPARISON:  Plain films chest and abdomen this same date.  FINDINGS: Gallbladder: A large stone measuring 4.2 cm in diameter is identified in the gallbladder. The gallbladder wall measures up to 1.1 cm in thickness. The majority of the gallbladder wall demonstrates a milder degree of thickening for approximately 0.7 cm. No pericholecystic fluid is seen. A 0.8 cm stone is identified in the gallbladder neck.  Common bile duct: Diameter: 0.3 cm  Liver: No focal lesion is identified. Echotexture is mildly heterogeneous. There is no intrahepatic biliary ductal dilatation.  IVC: No abnormality visualized.  Pancreas: Visualized portion unremarkable.  Spleen: Size and appearance within normal limits.  Right Kidney: Length: 13.2 cm. Echogenicity within normal limits. No mass or hydronephrosis visualized.  Left Kidney: Length: 12.7 cm. Echogenicity within normal limits. No mass or hydronephrosis visualized.  Abdominal aorta: No aneurysm visualized.  Other findings: None.  IMPRESSION: 4.2 cm stone in the gallbladder with a 0.8 cm stone in the gallbladder neck. The gallbladder wall is thickened but the sonographer reports negative Murphy's sign and no pericholecystic fluid is identified. Findings are suggestive of cholecystitis but not  definitive.   Electronically Signed   By: Inge Rise M.D.   On: 12/03/2014 21:42   Dg Abd Acute W/chest  12/03/2014   CLINICAL DATA:  Acute onset of generalized abdominal cramping for 24 hours. Initial encounter.  EXAM: ACUTE ABDOMEN SERIES (ABDOMEN 2 VIEW & CHEST 1 VIEW)  COMPARISON:  Chest radiograph performed 07/23/2014  FINDINGS: The lungs are well-aerated. Vascular congestion is noted. Bilateral central airspace opacities are increased from the prior study and may reflect mild pulmonary edema. Underlying bibasilar scarring is noted. A small left pleural effusion is seen. No pneumothorax is identified. The cardiomediastinal silhouette is borderline normal in size.  The visualized bowel gas pattern is unremarkable. Scattered fluid and air are seen within the colon; there is no evidence of small bowel dilatation to suggest obstruction. No free intra-abdominal air is identified on the provided upright view.  No acute osseous abnormalities are seen; the sacroiliac joints are unremarkable in appearance. Postoperative change is noted at the iliac wings bilaterally, with underlying chronic deformity.  IMPRESSION: 1. Vascular congestion noted. Bilateral central airspace opacities are increased from the prior study and may reflect mild pulmonary edema. Underlying bibasilar scarring noted. Small left pleural effusion seen. 2. Unremarkable bowel gas pattern; no free intra-abdominal air seen.   Electronically Signed   By: Francoise Schaumann.D.  On: 12/03/2014 21:11     Microbiology: Recent Results (from the past 240 hour(s))  Surgical pcr screen     Status: Abnormal   Collection Time: 12/04/14  8:07 AM  Result Value Ref Range Status   MRSA, PCR INVALID RESULTS, SPECIMEN SENT FOR CULTURE (A) NEGATIVE Final    Comment: RESULT CALLED TO, READ BACK BY AND VERIFIED WITH: Arlyn Dunning RN AT 03.03.16 BY SHUEA    Staphylococcus aureus INVALID RESULTS, SPECIMEN SENT FOR CULTURE (A) NEGATIVE Final    Comment:          The Xpert SA Assay (FDA approved for NASAL specimens in patients over 75 years of age), is one component of a comprehensive surveillance program.  Test performance has been validated by [facility] for patients greater than or equal to 40 year old. It is not intended to diagnose infection nor to guide or monitor treatment.      Labs: Basic Metabolic Panel:  Recent Labs Lab 12/03/14 1853 12/04/14 0130 12/05/14 0450  NA 140 134* 136  K 3.9 3.4* 4.1  CL 103 101 103  CO2 29 26 26   GLUCOSE 106* 118* 135*  BUN 7 8 10   CREATININE 0.82 0.77 0.94  CALCIUM 8.6 8.3* 8.6   Liver Function Tests:  Recent Labs Lab 12/03/14 1853 12/04/14 0130  AST 26 21  ALT 40 34  ALKPHOS 109 102  BILITOT 1.1 1.2  PROT 6.7 6.4  ALBUMIN 4.0 3.8    Recent Labs Lab 12/03/14 1853  LIPASE 23   No results for input(s): AMMONIA in the last 168 hours. CBC:  Recent Labs Lab 12/03/14 1853 12/04/14 0130 12/05/14 0450  WBC 12.2* 13.5* 18.1*  NEUTROABS 8.8* 9.5*  --   HGB 16.0 15.2 14.9  HCT 47.6 45.5 45.8  MCV 86.4 85.7 86.6  PLT 237 231 223   Cardiac Enzymes:  Recent Labs Lab 12/03/14 2010  TROPONINI <0.03   BNP: Invalid input(s): POCBNP CBG:  Recent Labs Lab 12/04/14 0100 12/04/14 0525 12/04/14 1655 12/05/14 0011 12/05/14 0636  GLUCAP 104* 97 166* 134* 132*    Time coordinating discharge:  Greater than 30 minutes  Signed:  Freemon Binford, DO [facility] Pager: [phone] 12/05/2014, 12:10 PM

## 2014-12-06 LAB — MRSA CULTURE

## 2014-12-08 LAB — GLUCOSE, CAPILLARY: Glucose-Capillary: 124 mg/dL — ABNORMAL HIGH (ref 70–99)

## 2016-02-04 ENCOUNTER — Emergency Department (HOSPITAL_COMMUNITY)
Admission: EM | Admit: 2016-02-04 | Discharge: 2016-02-05 | Disposition: A | Discharge status: Home / Self Care | Payer: Self-pay | Attending: Emergency Medicine | Admitting: Emergency Medicine

## 2016-02-04 ENCOUNTER — Encounter (HOSPITAL_COMMUNITY): Payer: Self-pay | Admitting: Nurse Practitioner

## 2016-02-04 ENCOUNTER — Emergency Department (HOSPITAL_COMMUNITY): Payer: Self-pay

## 2016-02-04 DIAGNOSIS — R197 Diarrhea, unspecified: Secondary | ICD-10-CM | POA: Insufficient documentation

## 2016-02-04 DIAGNOSIS — F17200 Nicotine dependence, unspecified, uncomplicated: Secondary | ICD-10-CM | POA: Insufficient documentation

## 2016-02-04 DIAGNOSIS — J439 Emphysema, unspecified: Secondary | ICD-10-CM | POA: Insufficient documentation

## 2016-02-04 DIAGNOSIS — R358 Other polyuria: Secondary | ICD-10-CM | POA: Insufficient documentation

## 2016-02-04 DIAGNOSIS — Z8659 Personal history of other mental and behavioral disorders: Secondary | ICD-10-CM | POA: Insufficient documentation

## 2016-02-04 DIAGNOSIS — J069 Acute upper respiratory infection, unspecified: Secondary | ICD-10-CM

## 2016-02-04 DIAGNOSIS — I10 Essential (primary) hypertension: Secondary | ICD-10-CM | POA: Insufficient documentation

## 2016-02-04 LAB — BASIC METABOLIC PANEL
Anion gap: 9 (ref 5–15)
BUN: 13 mg/dL (ref 6–20)
CO2: 21 mmol/L — ABNORMAL LOW (ref 22–32)
Calcium: 8.5 mg/dL — ABNORMAL LOW (ref 8.9–10.3)
Chloride: 105 mmol/L (ref 101–111)
Creatinine, Ser: 0.91 mg/dL (ref 0.61–1.24)
GFR calc Af Amer: >60 mL/min (ref >60)
GFR calc non Af Amer: >60 mL/min (ref >60)
Glucose, Bld: 111 mg/dL — ABNORMAL HIGH (ref 65–99)
Potassium: 4 mmol/L (ref 3.5–5.1)
Sodium: 135 mmol/L (ref 135–145)

## 2016-02-04 LAB — CBC WITH DIFFERENTIAL/PLATELET
Basophils Absolute: 0 10*3/uL (ref 0.0–0.1)
Basophils Relative: 0 %
Eosinophils Absolute: 0.1 10*3/uL (ref 0.0–0.7)
Eosinophils Relative: 1 %
HCT: 52.4 % — ABNORMAL HIGH (ref 39.0–52.0)
Hemoglobin: 17.8 g/dL — ABNORMAL HIGH (ref 13.0–17.0)
Lymphocytes Relative: 28 %
Lymphs Abs: 2 10*3/uL (ref 0.7–4.0)
MCH: 28.1 pg (ref 26.0–34.0)
MCHC: 34 g/dL (ref 30.0–36.0)
MCV: 82.8 fL (ref 78.0–100.0)
Monocytes Absolute: 0.9 10*3/uL (ref 0.1–1.0)
Monocytes Relative: 13 %
Neutro Abs: 4.2 10*3/uL (ref 1.7–7.7)
Neutrophils Relative %: 58 %
Platelets: 209 10*3/uL (ref 150–400)
RBC: 6.33 MIL/uL — ABNORMAL HIGH (ref 4.22–5.81)
RDW: 13.2 % (ref 11.5–15.5)
WBC: 7.2 10*3/uL (ref 4.0–10.5)

## 2016-02-04 LAB — CBG MONITORING, ED: Glucose-Capillary: 106 mg/dL — ABNORMAL HIGH (ref 65–99)

## 2016-02-04 LAB — I-STAT TROPONIN, ED: Troponin i, poc: 0 ng/mL (ref 0.00–0.08)

## 2016-02-04 LAB — BRAIN NATRIURETIC PEPTIDE: B Natriuretic Peptide: 26.5 pg/mL (ref 0.0–100.0)

## 2016-02-04 MED ORDER — ACETAMINOPHEN 500 MG PO TABS
1000.0000 mg | ORAL_TABLET | Freq: Once | ORAL | Status: AC
  Administered 2016-02-04: 1000 mg via ORAL
  Filled 2016-02-04: qty 2

## 2016-02-04 MED ORDER — IPRATROPIUM-ALBUTEROL 0.5-2.5 (3) MG/3ML IN SOLN
3.0000 mL | Freq: Four times a day (QID) | RESPIRATORY_TRACT | Status: DC
  Administered 2016-02-04: 3 mL via RESPIRATORY_TRACT
  Filled 2016-02-04: qty 3

## 2016-02-04 NOTE — ED Notes (Signed)
Pt reports malaise, polyuria, fever, chills and body aches, onset 3 days ago, also describes a "funny feeling to neck and chest." Has been doing OTC meds without getting any relief.

## 2016-02-04 NOTE — ED Provider Notes (Signed)
CSN: [id number]     Arrival date & time 02/04/16  1644 History  By signing my name below, I, Rowan Blase, attest that this documentation has been prepared under the direction and in the presence of non-physician practitioner, Antonietta Breach, PA-C. Electronically Signed: Rowan Blase, Scribe. 02/04/2016. 10:24 PM.   Chief Complaint  Patient presents with  . Fever  . Chills  . Generalized Body Aches   The history is provided by the patient. No language interpreter was used.   HPI Comments:  Ray Griffin is a 50 y.o. male with PMHx of COPD and HTN who presents to the Emergency Department complaining of worsening shortness of breath onset 3 days ago, worse when lying flat. Pt notes his chronic cough has worsened and become "wet". Pt reports associated sore throat secondary to cough, fever, chills, body aches, fatigue, weakness, intermittent leg swelling, and one episode of loose stools today.He notes symptom improvement 2 days ago; he went back to work the next day and symptoms returned. Pt has taken Mucinex with temporary relief. He notes sick contacts at work with similar symptoms. Pt is a current 1 pack/day smoker. Denies hemoptysis, bloody stools, chest pain or h/o heart failure.  Past Medical History  Diagnosis Date  . COPD (chronic obstructive pulmonary disease) ([place])   . Anxiety   . Emphysema   . Hypertension   . Tachycardia    Past Surgical History  Procedure Laterality Date  . Facial cosmetic surgery    . Thoracotomy    . Cholecystectomy N/A 12/04/2014    Procedure: LAPAROSCOPIC CHOLECYSTECTOMY WITH POSSIBLE INTRAOPERATIVE CHOLANGIOGRAM;  Surgeon: Gayland Curry, MD;  Location: [facility] ORS;  Service: General;  Laterality: N/A;   Family History  Problem Relation Age of Onset  . Diabetes Mother   . Heart failure Mother   . Diabetes Father   . Heart failure Father   . Bipolar disorder Brother    Social History  Substance Use Topics  . Smoking status: Current Every Day Smoker     Last Attempt to Quit: 07/19/2011  . Smokeless tobacco: Never Used  . Alcohol Use: No     Comment: recently quit 3 mths ago    Review of Systems  Constitutional: Positive for fever, chills and fatigue.  HENT: Positive for sore throat.   Respiratory: Positive for cough and shortness of breath.   Cardiovascular: Negative for chest pain.  Gastrointestinal: Positive for diarrhea. Negative for blood in stool.  Endocrine: Positive for polyuria.  Musculoskeletal: Positive for myalgias (generalized).  Neurological: Positive for weakness.  All other systems reviewed and are negative.   Allergies  Review of patient's allergies indicates no known allergies.  Home Medications   Prior to Admission medications   Medication Sig Start Date End Date Taking? Authorizing Provider  dextromethorphan (DELSYM) 30 MG/5ML liquid Take 30 mg by mouth every 6 (six) hours as needed for cough.    Yes Historical Provider, MD  dextromethorphan-guaiFENesin (MUCINEX DM) 30-600 MG 12hr tablet Take 1 tablet by mouth 2 (two) times daily as needed for cough.   Yes Historical Provider, MD  Pseudoeph-Doxylamine-DM-APAP (NYQUIL PO) Take 30 mLs by mouth every 8 (eight) hours as needed (cold symptoms).   Yes Historical Provider, MD  albuterol (PROVENTIL HFA;VENTOLIN HFA) 108 (90 BASE) MCG/ACT inhaler Inhale 1-2 puffs into the lungs every 6 (six) hours as needed for wheezing or shortness of breath. Patient not taking: Reported on 12/03/2014 07/23/14   Charlesetta Shanks, MD  azithromycin (ZITHROMAX) 250 MG tablet  Take 1 tablet (250 mg total) by mouth daily. Take 1 tablet every day until finished starting on 02/06/16 02/05/16   Antonietta Breach, PA-C  benzonatate (TESSALON) 100 MG capsule Take 1 capsule (100 mg total) by mouth 3 (three) times daily as needed for cough. 02/05/16   Antonietta Breach, PA-C  predniSONE (DELTASONE) 20 MG tablet Take 2 tablets (40 mg total) by mouth daily. 02/05/16   Antonietta Breach, PA-C   BP 139/84 mmHg  Pulse 73   Temp(Src) 99 F (37.2 C) (Oral)  Resp 20  SpO2 93%   Physical Exam  Constitutional: He is oriented to person, place, and time. He appears well-developed and well-nourished. No distress.  Nontoxic-appearing  HENT:  Head: Normocephalic and atraumatic.  Medial midline. Patient tolerating secretions without difficulty.  Eyes: Conjunctivae and EOM are normal. No scleral icterus.  Neck: Normal range of motion.  No nuchal rigidity or meningismus  Cardiovascular: Normal rate, regular rhythm and intact distal pulses.   Pulmonary/Chest: Effort normal. No respiratory distress. He has wheezes. He has no rales.  Nonproductive, congested sounding cough appreciated at bedside. No tachypnea or dyspnea. There is mild expiratory wheezing diffusely as well as adventitious and rhonchorous sounds. No rales appreciated.  Musculoskeletal: Normal range of motion.  Neurological: He is alert and oriented to person, place, and time. He exhibits normal muscle tone. Coordination normal.  Skin: Skin is warm and dry. No rash noted. He is not diaphoretic. No erythema. No pallor.  Psychiatric: He has a normal mood and affect. His behavior is normal.  Nursing note and vitals reviewed.   ED Course  Procedures  DIAGNOSTIC STUDIES:  Oxygen Saturation is 91% on RA, low by my interpretation.    COORDINATION OF CARE:  10:13 PM Will administer breathing treatment. Will order BMP, troponin, UA, CBC, BMP, and chest x-ray. Discussed treatment plan with pt at bedside and pt agreed to plan.  Labs Review Labs Reviewed  CBC WITH DIFFERENTIAL/PLATELET - Abnormal; Notable for the following:    RBC 6.33 (*)    Hemoglobin 17.8 (*)    HCT 52.4 (*)    All other components within normal limits  BASIC METABOLIC PANEL - Abnormal; Notable for the following:    CO2 21 (*)    Glucose, Bld 111 (*)    Calcium 8.5 (*)    All other components within normal limits  CBG MONITORING, ED - Abnormal; Notable for the following:     Glucose-Capillary 106 (*)    All other components within normal limits  BRAIN NATRIURETIC PEPTIDE  I-STAT TROPOININ, ED    Imaging Review Dg Chest 2 View  02/04/2016  CLINICAL DATA:  Productive cough, dyspnea, fever. EXAM: CHEST  2 VIEW COMPARISON:  12/03/2014 FINDINGS: Stable scarring in the bases. Emphysematous upper lobe disease. No superimposed consolidation or effusion. Pulmonary vasculature is normal. Hilar and mediastinal contours are unremarkable unchanged. Stable benign appearing pleural thickening in the lateral left base. IMPRESSION: Emphysematous disease. Stable scarring in the bases. No superimposed acute cardiopulmonary findings. Electronically Signed   By: Andreas Newport M.D.   On: 02/04/2016 22:41   I have personally reviewed and evaluated these images and lab results as part of my medical decision-making.   EKG Interpretation None       Medications  acetaminophen (TYLENOL) tablet 1,000 mg (1,000 mg Oral Given 02/04/16 2210)  predniSONE (DELTASONE) tablet 60 mg (60 mg Oral Given 02/05/16 0045)  benzonatate (TESSALON) capsule 100 mg (100 mg Oral Given 02/05/16 0045)  azithromycin (ZITHROMAX)  tablet 500 mg (500 mg Oral Given 02/05/16 0110)    MDM   Final diagnoses:  URI (upper respiratory infection)    Pt CXR negative for acute infiltrate. Patients symptoms are consistent with URI, likely viral etiology. He reports hx of sick contacts with similar symptoms. He is ambulatory in the ED without hypoxia. Patient will be discharged with symptomatic treatment and Rx for azithromycin. He verbalizes understanding and is agreeable with plan. Patient is hemodynamically stable and in NAD prior to discharge.  I personally performed the services described in this documentation, which was scribed in my presence. The recorded information has been reviewed and is accurate.    Filed Vitals:   02/04/16 1708 02/04/16 2104 02/04/16 2324 02/05/16 0110  BP: 152/98 153/87 139/90 139/84   Pulse: 78 90 66 73  Temp: 99.4 F (37.4 C) 100.2 F (37.9 C) 99 F (37.2 C)   TempSrc: Oral Oral Oral   Resp: 20 20 20 20   SpO2: 95% 91% 99% 93%      Antonietta Breach, PA-C 02/05/16 [place], MD 02/08/16 [phone]

## 2016-02-04 NOTE — ED Notes (Signed)
Pt. Made aware for the need of urine. 

## 2016-02-04 NOTE — ED Notes (Addendum)
Pulse ox dropped to 90% while ambulating, returned to 94% after 2 minutes at rest. Pt reported feeling "winded" on ambulation. PA notified.

## 2016-02-04 NOTE — ED Notes (Signed)
Pt. CBG 106. Rn,Christina made aware.

## 2016-02-05 MED ORDER — AZITHROMYCIN 250 MG PO TABS: 250.0000 mg | ORAL_TABLET | Freq: Every day | ORAL | Status: DC

## 2016-02-05 MED ORDER — PREDNISONE 20 MG PO TABS: 40.0000 mg | ORAL_TABLET | Freq: Every day | ORAL | Status: DC

## 2016-02-05 MED ORDER — ALBUTEROL SULFATE HFA 108 (90 BASE) MCG/ACT IN AERS
2.0000 | INHALATION_SPRAY | Freq: Four times a day (QID) | RESPIRATORY_TRACT | Status: DC | PRN
  Administered 2016-02-05: 2 via RESPIRATORY_TRACT
  Filled 2016-02-05: qty 6.7

## 2016-02-05 MED ORDER — PREDNISONE 20 MG PO TABS
60.0000 mg | ORAL_TABLET | Freq: Once | ORAL | Status: AC
  Administered 2016-02-05: 60 mg via ORAL
  Filled 2016-02-05: qty 3

## 2016-02-05 MED ORDER — BENZONATATE 100 MG PO CAPS
100.0000 mg | ORAL_CAPSULE | Freq: Once | ORAL | Status: AC
  Administered 2016-02-05: 100 mg via ORAL
  Filled 2016-02-05: qty 1

## 2016-02-05 MED ORDER — IPRATROPIUM-ALBUTEROL 0.5-2.5 (3) MG/3ML IN SOLN: 3.0000 mL | Freq: Four times a day (QID) | RESPIRATORY_TRACT | Status: DC

## 2016-02-05 MED ORDER — AZITHROMYCIN 250 MG PO TABS
500.0000 mg | ORAL_TABLET | Freq: Once | ORAL | Status: AC
  Administered 2016-02-05: 500 mg via ORAL
  Filled 2016-02-05: qty 2

## 2016-02-05 MED ORDER — BENZONATATE 100 MG PO CAPS: 100.0000 mg | ORAL_CAPSULE | Freq: Three times a day (TID) | ORAL | Status: DC | PRN

## 2016-02-05 NOTE — Discharge Instructions (Signed)
Take prednisone as prescribed. Use an albuterol inhaler, 2 puffs every 4-6 hours, as needed for cough and shortness of breath. You may take Tessalon for cough. Take azithromycin as prescribed. Follow-up with a primary care doctor if symptoms persist. Return as needed if symptoms worsen.  Upper Respiratory Infection, Adult Most upper respiratory infections (URIs) are a viral infection of the air passages leading to the lungs. A URI affects the nose, throat, and upper air passages. The most common type of URI is nasopharyngitis and is typically referred to as "the common cold." URIs run their course and usually go away on their own. Most of the time, a URI does not require medical attention, but sometimes a bacterial infection in the upper airways can follow a viral infection. This is called a secondary infection. Sinus and middle ear infections are common types of secondary upper respiratory infections. Bacterial pneumonia can also complicate a URI. A URI can worsen asthma and chronic obstructive pulmonary disease (COPD). Sometimes, these complications can require emergency medical care and may be life threatening.  CAUSES Almost all URIs are caused by viruses. A virus is a type of germ and can spread from one person to another.  RISKS FACTORS You may be at risk for a URI if:   You smoke.   You have chronic heart or lung disease.  You have a weakened defense (immune) system.   You are very young or very old.   You have nasal allergies or asthma.  You work in crowded or poorly ventilated areas.  You work in health care facilities or schools. SIGNS AND SYMPTOMS  Symptoms typically develop 2-3 days after you come in contact with a cold virus. Most viral URIs last 7-10 days. However, viral URIs from the influenza virus (flu virus) can last 14-18 days and are typically more severe. Symptoms may include:   Runny or stuffy (congested) nose.   Sneezing.   Cough.   Sore throat.    Headache.   Fatigue.   Fever.   Loss of appetite.   Pain in your forehead, behind your eyes, and over your cheekbones (sinus pain).  Muscle aches.  DIAGNOSIS  Your health care provider may diagnose a URI by:  Physical exam.  Tests to check that your symptoms are not due to another condition such as:  Strep throat.  Sinusitis.  Pneumonia.  Asthma. TREATMENT  A URI goes away on its own with time. It cannot be cured with medicines, but medicines may be prescribed or recommended to relieve symptoms. Medicines may help:  Reduce your fever.  Reduce your cough.  Relieve nasal congestion. HOME CARE INSTRUCTIONS   Take medicines only as directed by your health care provider.   Gargle warm saltwater or take cough drops to comfort your throat as directed by your health care provider.  Use a warm mist humidifier or inhale steam from a shower to increase air moisture. This may make it easier to breathe.  Drink enough fluid to keep your urine clear or pale yellow.   Eat soups and other clear broths and maintain good nutrition.   Rest as needed.   Return to work when your temperature has returned to normal or as your health care provider advises. You may need to stay home longer to avoid infecting others. You can also use a face mask and careful hand washing to prevent spread of the virus.  Increase the usage of your inhaler if you have asthma.   Do not use any  tobacco products, including cigarettes, chewing tobacco, or electronic cigarettes. If you need help quitting, ask your health care provider. PREVENTION  The best way to protect yourself from getting a cold is to practice good hygiene.   Avoid oral or hand contact with people with cold symptoms.   Wash your hands often if contact occurs.  There is no clear evidence that vitamin C, vitamin E, echinacea, or exercise reduces the chance of developing a cold. However, it is always recommended to get plenty  of rest, exercise, and practice good nutrition.  SEEK MEDICAL CARE IF:   You are getting worse rather than better.   Your symptoms are not controlled by medicine.   You have chills.  You have worsening shortness of breath.  You have brown or red mucus.  You have yellow or brown nasal discharge.  You have pain in your face, especially when you bend forward.  You have a fever.  You have swollen neck glands.  You have pain while swallowing.  You have white areas in the back of your throat. SEEK IMMEDIATE MEDICAL CARE IF:   You have severe or persistent:  Headache.  Ear pain.  Sinus pain.  Chest pain.  You have chronic lung disease and any of the following:  Wheezing.  Prolonged cough.  Coughing up blood.  A change in your usual mucus.  You have a stiff neck.  You have changes in your:  Vision.  Hearing.  Thinking.  Mood. MAKE SURE YOU:   Understand these instructions.  Will watch your condition.  Will get help right away if you are not doing well or get worse.   This information is not intended to replace advice given to you by your health care provider. Make sure you discuss any questions you have with your health care provider.   Document Released: 03/15/2001 Document Revised: 02/03/2015 Document Reviewed: 12/25/2013 Elsevier Interactive Patient Education 2016 Else[employer] Inc[employer]  Cough, Adult A cough helps to clear your throat and lungs. A cough may last only 2-3 weeks (acute), or it may last longer than 8 weeks (chronic). Many different things can cause a cough. A cough may be a sign of an illness or another medical condition. HOME CARE  Pay attention to any changes in your cough.  Take medicines only as told by your doctor.  If you were prescribed an antibiotic medicine, take it as told by your doctor. Do not stop taking it even if you start to feel better.  Talk with your doctor before you try using a cough medicine.  Drink enough  fluid to keep your pee (urine) clear or pale yellow.  If the air is dry, use a cold steam vaporizer or humidifier in your home.  Stay away from things that make you cough at work or at home.  If your cough is worse at night, try using extra pillows to raise your head up higher while you sleep.  Do not smoke, and try not to be around smoke. If you need help quitting, ask your doctor.  Do not have caffeine.  Do not drink alcohol.  Rest as needed. GET HELP IF:  You have new problems (symptoms).  You cough up yellow fluid (pus).  Your cough does not get better after 2-3 weeks, or your cough gets worse.  Medicine does not help your cough and you are not sleeping well.  You have pain that gets worse or pain that is not helped with medicine.  You have a  fever.  You are losing weight and you do not know why.  You have night sweats. GET HELP RIGHT AWAY IF:  You cough up blood.  You have trouble breathing.  Your heartbeat is very fast.   This information is not intended to replace advice given to you by your health care provider. Make sure you discuss any questions you have with your health care provider.   Document Released: 06/02/2011 Document Revised: 06/10/2015 Document Reviewed: 11/26/2014 Elsevier Interactive Patient Education [employer].

## 2016-11-28 ENCOUNTER — Encounter (HOSPITAL_COMMUNITY): Payer: Self-pay

## 2016-11-28 ENCOUNTER — Emergency Department (HOSPITAL_COMMUNITY)
Admission: EM | Admit: 2016-11-28 | Discharge: 2016-11-28 | Disposition: A | Discharge status: Home / Self Care | Payer: Self-pay | Attending: Emergency Medicine | Admitting: Emergency Medicine

## 2016-11-28 ENCOUNTER — Emergency Department (HOSPITAL_COMMUNITY): Payer: Self-pay

## 2016-11-28 DIAGNOSIS — B9789 Other viral agents as the cause of diseases classified elsewhere: Secondary | ICD-10-CM

## 2016-11-28 DIAGNOSIS — I10 Essential (primary) hypertension: Secondary | ICD-10-CM | POA: Insufficient documentation

## 2016-11-28 DIAGNOSIS — Z79899 Other long term (current) drug therapy: Secondary | ICD-10-CM | POA: Insufficient documentation

## 2016-11-28 DIAGNOSIS — J449 Chronic obstructive pulmonary disease, unspecified: Secondary | ICD-10-CM | POA: Insufficient documentation

## 2016-11-28 DIAGNOSIS — J069 Acute upper respiratory infection, unspecified: Secondary | ICD-10-CM | POA: Insufficient documentation

## 2016-11-28 DIAGNOSIS — F17200 Nicotine dependence, unspecified, uncomplicated: Secondary | ICD-10-CM | POA: Insufficient documentation

## 2016-11-28 MED ORDER — OXYMETAZOLINE HCL 0.05 % NA SOLN: 1.0000 | Freq: Two times a day (BID) | NASAL | 0 refills | Status: DC

## 2016-11-28 MED ORDER — BENZONATATE 100 MG PO CAPS: 200.0000 mg | ORAL_CAPSULE | Freq: Two times a day (BID) | ORAL | 0 refills | Status: DC | PRN

## 2016-11-28 NOTE — ED Triage Notes (Signed)
PT C/O A PRODUCTIVE COUGH AND SORE THROAT X1 WEEK. PT STS WHEN HE COUGHED TODAY THERE WAS BLOOD STREAKED INTO THE MUCUS. PT STS THE MUCUS HAS BEEN YELLOW AND THICK. PT DENIES FEVER OR EXCESSIVE NIGHT SWEATING.

## 2016-11-28 NOTE — ED Provider Notes (Signed)
[facility] DEPT Provider Note   CSN: [id number] Arrival date & time: 11/28/16  1733  By signing my name below, I, Reola Mosher, attest that this documentation has been prepared under the direction and in the presence of Harlene Ramus, PA-C.  Electronically Signed: Reola Mosher, ED Scribe. 11/28/16. 7:14 PM.  History   Chief Complaint Chief Complaint  Patient presents with  . Cough   The history is provided by the patient. No language interpreter was used.    HPI Comments: Ray Griffin is a 51 y.o. male with a h/o COPD, emphysema, HTN, who presents to the Emergency Department complaining of persistent, gradually improving productive cough w/ thick yellow sputum beginning one week ago.  Pt notes that today his symptoms have been improving since onset, however, three hours ago he had an isolated episode of coughing up small amount of bright red streaky blood in his sputum. Pt has coughed since this incident without the presence or return of blood. He notes associated sore throat, two episodes of rhinorrhea, and nasal/chest congestion secondary to his cough. He also states that he has had some wheezing, but this also improved today. Pt has been using vapor rubs at home with improvement of his symptoms. He has been around sick contacts with similar symptoms within his home.  He is currently a 1ppd smoker. Pt denies fever, generalized myalgias, HA, neck stiffness, ear pain, sinus pressure/pain, shortness of breath, chest pain, abdominal pain, nausea, vomiting, or any other associated symptoms.   Past Medical History:  Diagnosis Date  . Anxiety   . COPD (chronic obstructive pulmonary disease) ([place])   . Emphysema   . Hypertension   . Tachycardia    Patient Active Problem List   Diagnosis Date Noted  . Acute cholecystitis 12/04/2014  . COPD (chronic obstructive pulmonary disease) ([place]) 12/04/2014  . Gallstones 12/03/2014  . SINUSITIS - ACUTE-NOS 10/17/2009  . TOBACCO  USE 01/08/2008  . PANIC ATTACK, ACUTE 06/18/2007  . ALLERGIC RHINITIS 06/18/2007  . COPD 06/18/2007  . DEPRESSION 04/04/2007  . Essential hypertension 04/04/2007   Past Surgical History:  Procedure Laterality Date  . CHOLECYSTECTOMY N/A 12/04/2014   Procedure: LAPAROSCOPIC CHOLECYSTECTOMY WITH POSSIBLE INTRAOPERATIVE CHOLANGIOGRAM;  Surgeon: Gayland Curry, MD;  Location: [facility] ORS;  Service: General;  Laterality: N/A;  . FACIAL COSMETIC SURGERY    . THORACOTOMY      Home Medications    Prior to Admission medications   Medication Sig Start Date End Date Taking? Authorizing Provider  albuterol (PROVENTIL HFA;VENTOLIN HFA) 108 (90 BASE) MCG/ACT inhaler Inhale 1-2 puffs into the lungs every 6 (six) hours as needed for wheezing or shortness of breath. Patient not taking: Reported on 12/03/2014 07/23/14   Charlesetta Shanks, MD  azithromycin (ZITHROMAX) 250 MG tablet Take 1 tablet (250 mg total) by mouth daily. Take 1 tablet every day until finished starting on 02/06/16 02/05/16   Antonietta Breach, PA-C  benzonatate (TESSALON) 100 MG capsule Take 2 capsules (200 mg total) by mouth 2 (two) times daily as needed for cough. 11/28/16   Nona Dell, PA-C  dextromethorphan (DELSYM) 30 MG/5ML liquid Take 30 mg by mouth every 6 (six) hours as needed for cough.     Historical Provider, MD  dextromethorphan-guaiFENesin (MUCINEX DM) 30-600 MG 12hr tablet Take 1 tablet by mouth 2 (two) times daily as needed for cough.    Historical Provider, MD  oxymetazoline (AFRIN NASAL SPRAY) 0.05 % nasal spray Place 1 spray into both nostrils 2 (two)  times daily. Spray once into each nostril twice daily for up to the next 3 days. Do not use for more than 3 days to prevent rebound rhinorrhea. 11/28/16   Nona Dell, PA-C  predniSONE (DELTASONE) 20 MG tablet Take 2 tablets (40 mg total) by mouth daily. 02/05/16   Antonietta Breach, PA-C  Pseudoeph-Doxylamine-DM-APAP (NYQUIL PO) Take 30 mLs by mouth every 8 (eight) hours as  needed (cold symptoms).    Historical Provider, MD   Family History Family History  Problem Relation Age of Onset  . Diabetes Mother   . Heart failure Mother   . Diabetes Father   . Heart failure Father   . Bipolar disorder Brother    Social History Social History  Substance Use Topics  . Smoking status: Current Every Day Smoker    Last attempt to quit: 07/19/2011  . Smokeless tobacco: Never Used  . Alcohol use No     Comment: recently quit 3 mths ago   Allergies   Patient has no known allergies.  Review of Systems Review of Systems  Constitutional: Negative for fever.  HENT: Positive for congestion, rhinorrhea and sore throat. Negative for ear pain, sinus pain and sinus pressure.   Respiratory: Positive for cough (improving) and wheezing (improved). Negative for shortness of breath.        Positive for one episode of hemoptysis.   Cardiovascular: Negative for chest pain.  Gastrointestinal: Negative for abdominal pain, nausea and vomiting.  Musculoskeletal: Negative for myalgias.   Physical Exam Updated Vital Signs BP 172/95 (BP Location: Left Arm)   Pulse 89   Temp 98.1 F (36.7 C) (Oral)   Resp 18   Ht 6\' 1"  (1.854 m)   Wt 108 kg   SpO2 98%   BMI 31.40 kg/m   Physical Exam  Constitutional: He is oriented to person, place, and time. He appears well-developed and well-nourished. No distress.  HENT:  Head: Normocephalic and atraumatic.  Nose: Rhinorrhea present. Right sinus exhibits no maxillary sinus tenderness and no frontal sinus tenderness. Left sinus exhibits no maxillary sinus tenderness and no frontal sinus tenderness.  Mouth/Throat: Uvula is midline and mucous membranes are normal. Posterior oropharyngeal erythema present. No oropharyngeal exudate, posterior oropharyngeal edema or tonsillar abscesses. No tonsillar exudate.  Eyes: Conjunctivae and EOM are normal. Right eye exhibits no discharge. Left eye exhibits no discharge. No scleral icterus.  Neck:  Normal range of motion. Neck supple.  Cardiovascular: Normal rate, regular rhythm, normal heart sounds and intact distal pulses.   Pulmonary/Chest: Effort normal and breath sounds normal. No respiratory distress. He has no wheezes. He has no rales. He exhibits no tenderness.  Abdominal: Soft. Bowel sounds are normal. He exhibits no distension and no mass. There is no tenderness. There is no rebound and no guarding.  Musculoskeletal: Normal range of motion. He exhibits no edema.  Lymphadenopathy:    He has no cervical adenopathy.  Neurological: He is alert and oriented to person, place, and time.  Skin: Skin is warm and dry. He is not diaphoretic. No pallor.  Psychiatric: He has a normal mood and affect. His behavior is normal.  Nursing note and vitals reviewed.  ED Treatments / Results  DIAGNOSTIC STUDIES: Oxygen Saturation is 98% on RA, normal by my interpretation.   COORDINATION OF CARE: 7:14 PM-Discussed next steps with pt. Pt verbalized understanding and is agreeable with the plan.   Labs (all labs ordered are listed, but only abnormal results are displayed) Labs Reviewed - No data  to display  EKG  EKG Interpretation None      Radiology Dg Chest 2 View  Result Date: 11/28/2016 CLINICAL DATA:  Cough and shortness of breath for 1 week. History of COPD. EXAM: CHEST  2 VIEW COMPARISON:  02/04/2016 FINDINGS: The cardiac silhouette is upper limits of normal in size, unchanged. Postoperative changes are again seen in the right lung with tenting of the hemidiaphragm. The lungs otherwise appear hyperinflated with upper lobe predominant emphysematous changes. Chronic interstitial coarsening with likely areas of scarring in both lung bases. No definite acute airspace consolidation, overt pulmonary edema, sizable pleural effusion, or pneumothorax is identified. No acute osseous abnormality is seen. IMPRESSION: COPD without evidence of active cardiopulmonary disease. Electronically Signed    By: Logan Bores M.D.   On: 11/28/2016 19:10   Procedures Procedures   Medications Ordered in ED Medications - No data to display  Initial Impression / Assessment and Plan / ED Course  I have reviewed the triage vital signs and the nursing notes.  Pertinent labs & imaging results that were available during my care of the patient were reviewed by me and considered in my medical decision making (see chart for details).     This is a 51yo male with a h/o COPD, emphysema, HTN. Presents with URI-like symptoms and cough which have all been improving. However, had one isolated episode of right red blood with cough today which has since resolved. Pt CXR negative for acute infiltrate. Patients symptoms are consistent with URI, likely viral etiology. Discussed that antibiotics are not indicated for viral infections. Pt will be discharged with symptomatic treatment.  Verbalizes understanding and is agreeable with plan. Pt is hemodynamically stable & in NAD prior to dc.   Patients blood pressure 172/95 on initial evaluation. Patient reports history of hypertension and denies taking any meds at this time. Patient denies headache, visual changes, lightheadedness, dizziness, shortness of breath, chest pain, abdominal pain, numbness, tingling, weakness. No signs of hypertensive emergency or urgency at this time. Advised pt to follow up with PCP in 1 week for reevaluation regarding HTN.   Final Clinical Impressions(s) / ED Diagnoses   Final diagnoses:  Viral URI with cough   New Prescriptions New Prescriptions   BENZONATATE (TESSALON) 100 MG CAPSULE    Take 2 capsules (200 mg total) by mouth 2 (two) times daily as needed for cough.   OXYMETAZOLINE (AFRIN NASAL SPRAY) 0.05 % NASAL SPRAY    Place 1 spray into both nostrils 2 (two) times daily. Spray once into each nostril twice daily for up to the next 3 days. Do not use for more than 3 days to prevent rebound rhinorrhea.   I personally performed the  services described in this documentation, which was scribed in my presence. The recorded information has been reviewed and is accurate.     Chesley Noon [place], [place] [id number]    David Glick, MD [id number]

## 2016-11-28 NOTE — Discharge Instructions (Signed)
Take your medications as prescribed. I also recommend taking Tylenol and ibuprofen as prescribed over-the-counter, alternating between doses every 3-4 hours. Continue drinking fluids at home to remain hydrated.  Follow-up with your primary care provider in the next 3-4 days if symptoms have not improved. Return to the emergency department if symptoms worsen or new onset of headache, neck stiffness, difficulty breathing, coughing up blood only, chest pain, abdominal pain, vomiting, unable to keep fluids down.

## 2017-08-17 ENCOUNTER — Emergency Department (HOSPITAL_COMMUNITY): Payer: Self-pay

## 2017-08-17 ENCOUNTER — Emergency Department (HOSPITAL_COMMUNITY)
Admission: EM | Admit: 2017-08-17 | Discharge: 2017-08-17 | Disposition: A | Discharge status: Home / Self Care | Payer: Self-pay | Attending: Emergency Medicine | Admitting: Emergency Medicine

## 2017-08-17 ENCOUNTER — Encounter (HOSPITAL_COMMUNITY): Payer: Self-pay

## 2017-08-17 DIAGNOSIS — R059 Cough, unspecified: Secondary | ICD-10-CM

## 2017-08-17 DIAGNOSIS — J069 Acute upper respiratory infection, unspecified: Secondary | ICD-10-CM | POA: Insufficient documentation

## 2017-08-17 DIAGNOSIS — R05 Cough: Secondary | ICD-10-CM

## 2017-08-17 DIAGNOSIS — I10 Essential (primary) hypertension: Secondary | ICD-10-CM | POA: Insufficient documentation

## 2017-08-17 DIAGNOSIS — F17200 Nicotine dependence, unspecified, uncomplicated: Secondary | ICD-10-CM | POA: Insufficient documentation

## 2017-08-17 DIAGNOSIS — J449 Chronic obstructive pulmonary disease, unspecified: Secondary | ICD-10-CM | POA: Insufficient documentation

## 2017-08-17 LAB — BASIC METABOLIC PANEL
Anion gap: 8 (ref 5–15)
BUN: 10 mg/dL (ref 6–20)
CO2: 23 mmol/L (ref 22–32)
Calcium: 8.9 mg/dL (ref 8.9–10.3)
Chloride: 104 mmol/L (ref 101–111)
Creatinine, Ser: 0.76 mg/dL (ref 0.61–1.24)
GFR calc Af Amer: >60 mL/min (ref >60)
GFR calc non Af Amer: >60 mL/min (ref >60)
Glucose, Bld: 102 mg/dL — ABNORMAL HIGH (ref 65–99)
Potassium: 4 mmol/L (ref 3.5–5.1)
Sodium: 135 mmol/L (ref 135–145)

## 2017-08-17 LAB — CBC WITH DIFFERENTIAL/PLATELET
Basophils Absolute: 0 10*3/uL (ref 0.0–0.1)
Basophils Relative: 0 %
Eosinophils Absolute: 0.2 10*3/uL (ref 0.0–0.7)
Eosinophils Relative: 2 %
HCT: 50.9 % (ref 39.0–52.0)
Hemoglobin: 17.3 g/dL — ABNORMAL HIGH (ref 13.0–17.0)
Lymphocytes Relative: 11 %
Lymphs Abs: 0.9 10*3/uL (ref 0.7–4.0)
MCH: 29 pg (ref 26.0–34.0)
MCHC: 34 g/dL (ref 30.0–36.0)
MCV: 85.3 fL (ref 78.0–100.0)
Monocytes Absolute: 1 10*3/uL (ref 0.1–1.0)
Monocytes Relative: 13 %
Neutro Abs: 5.8 10*3/uL (ref 1.7–7.7)
Neutrophils Relative %: 74 %
Platelets: 229 10*3/uL (ref 150–400)
RBC: 5.97 MIL/uL — ABNORMAL HIGH (ref 4.22–5.81)
RDW: 13.4 % (ref 11.5–15.5)
WBC: 8 10*3/uL (ref 4.0–10.5)

## 2017-08-17 MED ORDER — PREDNISONE 20 MG PO TABS
40.0000 mg | ORAL_TABLET | Freq: Once | ORAL | Status: AC
  Administered 2017-08-17: 40 mg via ORAL
  Filled 2017-08-17: qty 2

## 2017-08-17 MED ORDER — AEROCHAMBER PLUS FLO-VU MEDIUM MISC
1.0000 | Freq: Once | Status: AC
  Administered 2017-08-17: 1
  Filled 2017-08-17: qty 1

## 2017-08-17 MED ORDER — SODIUM CHLORIDE 0.9 % IV BOLUS (SEPSIS)
500.0000 mL | Freq: Once | INTRAVENOUS | Status: AC
  Administered 2017-08-17: 500 mL via INTRAVENOUS

## 2017-08-17 MED ORDER — IPRATROPIUM-ALBUTEROL 0.5-2.5 (3) MG/3ML IN SOLN
3.0000 mL | Freq: Once | RESPIRATORY_TRACT | Status: AC
  Administered 2017-08-17: 3 mL via RESPIRATORY_TRACT
  Filled 2017-08-17: qty 3

## 2017-08-17 MED ORDER — PREDNISONE 20 MG PO TABS: 40.0000 mg | ORAL_TABLET | Freq: Every day | ORAL | 0 refills | Status: DC

## 2017-08-17 MED ORDER — DOXYCYCLINE HYCLATE 100 MG PO CAPS: 100.0000 mg | ORAL_CAPSULE | Freq: Two times a day (BID) | ORAL | 0 refills | Status: DC

## 2017-08-17 MED ORDER — ALBUTEROL SULFATE HFA 108 (90 BASE) MCG/ACT IN AERS
1.0000 | INHALATION_SPRAY | Freq: Once | RESPIRATORY_TRACT | Status: AC
  Administered 2017-08-17: 1 via RESPIRATORY_TRACT
  Filled 2017-08-17: qty 6.7

## 2017-08-17 NOTE — ED Notes (Signed)
Pt complains of chills and low grade fever EMS reports wheezes and gave a treatment Pt states that he has felt bad for two days

## 2017-08-17 NOTE — ED Notes (Signed)
Bed: RE32 Expected date:  Expected time:  Means of arrival:  Comments: EMS 51 yo flu like symptoms/wheezing

## 2017-08-17 NOTE — Discharge Instructions (Signed)
It was my pleasure taking care of you today!   Albuterol inhaler every four hours as needed.  Take prednisone daily starting tomorrow. You had today's dose in the ER today.  Please take all of your antibiotics until finished!   Please follow up with your primary care provider. If you do not have one, please call the clinic listed to schedule an appointment.   Return to ER for new or worsening symptoms, any additional concerns.

## 2017-08-17 NOTE — ED Notes (Signed)
Pt states the breathing treatment in route helped but it hurts when he coughs

## 2017-08-17 NOTE — ED Provider Notes (Signed)
[facility] DEPT Provider Note   CSN: [id number] Arrival date & time: 08/17/17  0407     History   Chief Complaint Chief Complaint  Patient presents with  . flu like sx    HPI Ray Griffin is a 51 y.o. male.  The history is provided by the patient and medical records. No language interpreter was used.   Ray Griffin is a 51 y.o. male  with a PMH of COPD, HTN who presents to the Emergency Department complaining of progressively worsening cough productive of whitish / yellow sputum x 2 days. Associated with scratchy throat, nasal congestion and headaches.  He tried NyQuil, Vicks and OTC chest congestion medication with little improvement.  He denies chest pain, shortness of breath, fever, chills, abdominal pain, nausea, vomiting.  He is a daily smoker.  He is not on any home inhalers.  Past Medical History:  Diagnosis Date  . Anxiety   . COPD (chronic obstructive pulmonary disease) ([place])   . Emphysema   . Hypertension   . Tachycardia     Patient Active Problem List   Diagnosis Date Noted  . Acute cholecystitis 12/04/2014  . COPD (chronic obstructive pulmonary disease) ([place]) 12/04/2014  . Gallstones 12/03/2014  . SINUSITIS - ACUTE-NOS 10/17/2009  . TOBACCO USE 01/08/2008  . PANIC ATTACK, ACUTE 06/18/2007  . ALLERGIC RHINITIS 06/18/2007  . COPD 06/18/2007  . DEPRESSION 04/04/2007  . Essential hypertension 04/04/2007    Past Surgical History:  Procedure Laterality Date  . CHOLECYSTECTOMY N/A 12/04/2014   Procedure: LAPAROSCOPIC CHOLECYSTECTOMY WITH POSSIBLE INTRAOPERATIVE CHOLANGIOGRAM;  Surgeon: Gayland Curry, MD;  Location: [facility];  Service: General;  Laterality: N/A;  . FACIAL COSMETIC SURGERY    . THORACOTOMY         Home Medications    Prior to Admission medications   Medication Sig Start Date End Date Taking? Authorizing Provider  doxycycline (VIBRAMYCIN) 100 MG capsule Take 1 capsule (100 mg total) 2 (two) times daily  by mouth. 08/17/17   Gabriella Woodhead, Ozella Almond, PA-C  predniSONE (DELTASONE) 20 MG tablet Take 2 tablets (40 mg total) daily by mouth. 08/17/17   Llewellyn Choplin, Ozella Almond, PA-C    Family History Family History  Problem Relation Age of Onset  . Diabetes Mother   . Heart failure Mother   . Diabetes Father   . Heart failure Father   . Bipolar disorder Brother     Social History Social History   Tobacco Use  . Smoking status: Current Every Day Smoker    Last attempt to quit: 07/19/2011    Years since quitting: 6.0  . Smokeless tobacco: Never Used  Substance Use Topics  . Alcohol use: No    Comment: recently quit 3 mths ago  . Drug use: No     Allergies   Patient has no known allergies.   Review of Systems Review of Systems  Constitutional: Negative for fever.  HENT: Positive for congestion and sore throat.   Respiratory: Positive for cough. Negative for shortness of breath.   Cardiovascular: Negative for chest pain.  All other systems reviewed and are negative.    Physical Exam Updated Vital Signs BP 134/81   Pulse 78   Temp 98.6 F (37 C) (Oral)   Resp 20   SpO2 95%   Physical Exam  Constitutional: He is oriented to person, place, and time. He appears well-developed and well-nourished. No distress.  HENT:  Head: Normocephalic and atraumatic.  OP with erythema,  no exudates or tonsillar hypertrophy.  + nasal congestion.  Cardiovascular: Normal rate, regular rhythm and normal heart sounds.  No murmur heard. Pulmonary/Chest: Effort normal. No respiratory distress. He has no wheezes. He has no rales.  Abdominal: Soft. He exhibits no distension. There is no tenderness.  Musculoskeletal: He exhibits no edema.  Neurological: He is alert and oriented to person, place, and time.  Skin: Skin is warm and dry.  Nursing note and vitals reviewed.    ED Treatments / Results  Labs (all labs ordered are listed, but only abnormal results are displayed) Labs Reviewed  BASIC  METABOLIC PANEL - Abnormal; Notable for the following components:      Result Value   Glucose, Bld 102 (*)    All other components within normal limits  CBC WITH DIFFERENTIAL/PLATELET - Abnormal; Notable for the following components:   RBC 5.97 (*)    Hemoglobin 17.3 (*)    All other components within normal limits    EKG  EKG Interpretation None       Radiology Dg Chest 2 View  Result Date: 08/17/2017 CLINICAL DATA:  Cough and congestion EXAM: CHEST  2 VIEW COMPARISON:  11/28/2016 FINDINGS: There is chronic hyperinflation and interstitial coarsening attributed to patient's history of COPD. Apical emphysematous change is noted. Linear scar at the right apex, possibly postoperative. There is chronic scarring and blunting at the lateral left costophrenic sulcus. Normal heart size and stable mediastinal contours. IMPRESSION: COPD and scarring without acute superimposed finding. Stable compared to 11/28/2016. Electronically Signed   By: Monte Fantasia M.D.   On: 08/17/2017 07:17    Procedures Procedures (including critical care time)  Medications Ordered in ED Medications  ipratropium-albuterol (DUONEB) 0.5-2.5 (3) MG/3ML nebulizer solution 3 mL (3 mLs Nebulization Given 08/17/17 0654)  sodium chloride 0.9 % bolus 500 mL (0 mLs Intravenous Stopped 08/17/17 0812)  predniSONE (DELTASONE) tablet 40 mg (40 mg Oral Given 08/17/17 0654)  albuterol (PROVENTIL HFA;VENTOLIN HFA) 108 (90 Base) MCG/ACT inhaler 1 puff (1 puff Inhalation Given 08/17/17 0808)  AEROCHAMBER PLUS FLO-VU MEDIUM MISC 1 each (1 each Other Given 08/17/17 0808)     Initial Impression / Assessment and Plan / ED Course  I have reviewed the triage vital signs and the nursing notes.  Pertinent labs & imaging results that were available during my care of the patient were reviewed by me and considered in my medical decision making (see chart for details).    Ray Griffin is a 51 y.o. male who presents to ED for cough,  congestion, sore throat. Hx of COPD but not on any inhalers. Afebrile, hemodynamically stable. Feels better after neb treatment in ED. CXR with no acute findings. Labs reassuring. Will treat with steroid burst and doxycyline. Albuterol inhaler given in ED and patient educated on use. PCP follow up strongly encouraged. Reasons to return to ER discussed. All questions answered.  Patient discussed with Dr. Randal Buba who agrees with treatment plan.   Final Clinical Impressions(s) / ED Diagnoses   Final diagnoses:  Cough  Upper respiratory tract infection, unspecified type    ED Discharge Orders        Ordered    predniSONE (DELTASONE) 20 MG tablet  Daily     08/17/17 0755    doxycycline (VIBRAMYCIN) 100 MG capsule  2 times daily     08/17/17 [place], Ozella Almond, PA-C 08/17/17 2774    Randal Buba, April, MD 08/17/17 2317

## 2017-09-16 ENCOUNTER — Emergency Department (HOSPITAL_COMMUNITY)
Admission: EM | Admit: 2017-09-16 | Discharge: 2017-09-16 | Disposition: A | Discharge status: Home / Self Care | Payer: Self-pay | Attending: Emergency Medicine | Admitting: Emergency Medicine

## 2017-09-16 ENCOUNTER — Other Ambulatory Visit: Payer: Self-pay

## 2017-09-16 ENCOUNTER — Encounter (HOSPITAL_COMMUNITY): Payer: Self-pay

## 2017-09-16 ENCOUNTER — Emergency Department (HOSPITAL_COMMUNITY): Payer: Self-pay

## 2017-09-16 DIAGNOSIS — I10 Essential (primary) hypertension: Secondary | ICD-10-CM | POA: Insufficient documentation

## 2017-09-16 DIAGNOSIS — J441 Chronic obstructive pulmonary disease with (acute) exacerbation: Secondary | ICD-10-CM | POA: Insufficient documentation

## 2017-09-16 DIAGNOSIS — F17200 Nicotine dependence, unspecified, uncomplicated: Secondary | ICD-10-CM | POA: Insufficient documentation

## 2017-09-16 LAB — CBC WITH DIFFERENTIAL/PLATELET
Basophils Absolute: 0 10*3/uL (ref 0.0–0.1)
Basophils Relative: 0 %
Eosinophils Absolute: 0.2 10*3/uL (ref 0.0–0.7)
Eosinophils Relative: 2 %
HCT: 49.7 % (ref 39.0–52.0)
Hemoglobin: 16.7 g/dL (ref 13.0–17.0)
Lymphocytes Relative: 19 %
Lymphs Abs: 2 10*3/uL (ref 0.7–4.0)
MCH: 28.6 pg (ref 26.0–34.0)
MCHC: 33.6 g/dL (ref 30.0–36.0)
MCV: 85.1 fL (ref 78.0–100.0)
Monocytes Absolute: 0.7 10*3/uL (ref 0.1–1.0)
Monocytes Relative: 6 %
Neutro Abs: 7.6 10*3/uL (ref 1.7–7.7)
Neutrophils Relative %: 73 %
Platelets: 225 10*3/uL (ref 150–400)
RBC: 5.84 MIL/uL — ABNORMAL HIGH (ref 4.22–5.81)
RDW: 13.5 % (ref 11.5–15.5)
WBC: 10.4 10*3/uL (ref 4.0–10.5)

## 2017-09-16 LAB — BASIC METABOLIC PANEL
Anion gap: 7 (ref 5–15)
BUN: 11 mg/dL (ref 6–20)
CO2: 22 mmol/L (ref 22–32)
Calcium: 8.5 mg/dL — ABNORMAL LOW (ref 8.9–10.3)
Chloride: 107 mmol/L (ref 101–111)
Creatinine, Ser: 0.83 mg/dL (ref 0.61–1.24)
GFR calc Af Amer: >60 mL/min (ref >60)
GFR calc non Af Amer: >60 mL/min (ref >60)
Glucose, Bld: 121 mg/dL — ABNORMAL HIGH (ref 65–99)
Potassium: 4.2 mmol/L (ref 3.5–5.1)
Sodium: 136 mmol/L (ref 135–145)

## 2017-09-16 LAB — I-STAT TROPONIN, ED: Troponin i, poc: 0 ng/mL (ref 0.00–0.08)

## 2017-09-16 LAB — BRAIN NATRIURETIC PEPTIDE: B Natriuretic Peptide: 59.5 pg/mL (ref 0.0–100.0)

## 2017-09-16 MED ORDER — IPRATROPIUM-ALBUTEROL 0.5-2.5 (3) MG/3ML IN SOLN
3.0000 mL | Freq: Once | RESPIRATORY_TRACT | Status: AC
Start: 2017-09-16 — End: 2017-09-16
  Administered 2017-09-16: 3 mL via RESPIRATORY_TRACT
  Filled 2017-09-16: qty 3

## 2017-09-16 MED ORDER — ALBUTEROL SULFATE HFA 108 (90 BASE) MCG/ACT IN AERS
2.0000 | INHALATION_SPRAY | RESPIRATORY_TRACT | Status: DC | PRN
  Administered 2017-09-16: 2 via RESPIRATORY_TRACT
  Filled 2017-09-16: qty 6.7

## 2017-09-16 MED ORDER — PREDNISONE 10 MG PO TABS: 20.0000 mg | ORAL_TABLET | Freq: Two times a day (BID) | ORAL | 0 refills | Status: DC

## 2017-09-16 NOTE — ED Notes (Signed)
Patient transported to X-ray 

## 2017-09-16 NOTE — ED Provider Notes (Signed)
[facility] EMERGENCY DEPARTMENT Provider Note   CSN: [id number] Arrival date & time: 09/16/17  7829     History   Chief Complaint Chief Complaint  Patient presents with  . Respiratory Distress    HPI Ray Griffin is a 51 y.o. male.  Patient is a 51 year old male with past medical history of COPD, emphysema, hypertension, and prior thoracotomy for ruptured bleb and pneumothorax.  This was performed in 2004 at [facility].  He woke this morning feeling short of breath, then developed respiratory distress and called 911.  He was placed on CPAP and transported here.  He is now feeling significantly better.  He does report recent URI like symptoms stating that he has had a "cold he just cannot kick".   The history is provided by the patient.    Past Medical History:  Diagnosis Date  . Anxiety   . COPD (chronic obstructive pulmonary disease) ([place])   . Emphysema   . Hypertension   . Tachycardia     Patient Active Problem List   Diagnosis Date Noted  . Acute cholecystitis 12/04/2014  . COPD (chronic obstructive pulmonary disease) ([place]) 12/04/2014  . Gallstones 12/03/2014  . SINUSITIS - ACUTE-NOS 10/17/2009  . TOBACCO USE 01/08/2008  . PANIC ATTACK, ACUTE 06/18/2007  . ALLERGIC RHINITIS 06/18/2007  . COPD 06/18/2007  . DEPRESSION 04/04/2007  . Essential hypertension 04/04/2007    Past Surgical History:  Procedure Laterality Date  . CHOLECYSTECTOMY N/A 12/04/2014   Procedure: LAPAROSCOPIC CHOLECYSTECTOMY WITH POSSIBLE INTRAOPERATIVE CHOLANGIOGRAM;  Surgeon: Gayland Curry, MD;  Location: [facility];  Service: General;  Laterality: N/A;  . FACIAL COSMETIC SURGERY    . THORACOTOMY         Home Medications    Prior to Admission medications   Medication Sig Start Date End Date Taking? Authorizing Provider  doxycycline (VIBRAMYCIN) 100 MG capsule Take 1 capsule (100 mg total) 2 (two) times daily by mouth. 08/17/17   Ward, Ozella Almond, PA-C  predniSONE  (DELTASONE) 20 MG tablet Take 2 tablets (40 mg total) daily by mouth. 08/17/17   Ward, Ozella Almond, PA-C    Family History Family History  Problem Relation Age of Onset  . Diabetes Mother   . Heart failure Mother   . Diabetes Father   . Heart failure Father   . Bipolar disorder Brother     Social History Social History   Tobacco Use  . Smoking status: Current Every Day Smoker    Last attempt to quit: 07/19/2011    Years since quitting: 6.1  . Smokeless tobacco: Never Used  Substance Use Topics  . Alcohol use: No    Comment: recently quit 3 mths ago  . Drug use: No     Allergies   Patient has no known allergies.   Review of Systems Review of Systems  All other systems reviewed and are negative.    Physical Exam Updated Vital Signs BP (!) 169/90 (BP Location: Left Arm)   Pulse 76   Temp 98.2 F (36.8 C) (Oral)   Resp (!) 26   Ht 6\' 1"  (1.854 m)   SpO2 99%   BMI 31.40 kg/m   Physical Exam  Constitutional: He is oriented to person, place, and time. He appears well-developed and well-nourished. No distress.  HENT:  Head: Normocephalic and atraumatic.  Mouth/Throat: Oropharynx is clear and moist.  Neck: Normal range of motion. Neck supple.  Cardiovascular: Normal rate and regular rhythm. Exam reveals no friction rub.  No murmur heard. Pulmonary/Chest: Effort normal. No respiratory distress. He has wheezes. He has no rales.  There are slight rhonchi bilaterally.  Abdominal: Soft. Bowel sounds are normal. He exhibits no distension. There is no tenderness.  Musculoskeletal: Normal range of motion. He exhibits no edema.  Neurological: He is alert and oriented to person, place, and time. Coordination normal.  Skin: Skin is warm and dry. He is not diaphoretic.  Nursing note and vitals reviewed.    ED Treatments / Results  Labs (all labs ordered are listed, but only abnormal results are displayed) Labs Reviewed  CBC WITH DIFFERENTIAL/PLATELET  BASIC  METABOLIC PANEL  I-STAT TROPONIN, ED    EKG  EKG Interpretation None       Radiology No results found.  Procedures Procedures (including critical care time)  Medications Ordered in ED Medications - No data to display   Initial Impression / Assessment and Plan / ED Course  I have reviewed the triage vital signs and the nursing notes.  Pertinent labs & imaging results that were available during my care of the patient were reviewed by me and considered in my medical decision making (see chart for details).  Patient brought here by EMS complaining of shortness of breath.  This woke him from sleep this morning.  His symptoms markedly improved shortly after arriving in the ER and much better.  His chest x-ray makes the suggestion of possible pulmonary edema, however this does not fit the clinical picture.  His BNP is 16 and he has no history of this.  He does have a history of COPD and I suspect that this is the likely cause.  I suspect he experienced a bronchospasm this morning.  He will be treated with steroids, an inhaler, and follow-up as needed.  Final Clinical Impressions(s) / ED Diagnoses   Final diagnoses:  None    ED Discharge Orders    None       Veryl Speak, MD 09/16/17 [id number]

## 2017-09-16 NOTE — ED Triage Notes (Signed)
Patient from home with respiratory distress that woke him up. Patient was placed on CPAP by EMS for rales throughout and unable to ambulate 5 feet without distress.  CPAP removed once in hospital.  Patient able to talk in complete sentences.  1 Nitro given by EMS.

## 2017-09-16 NOTE — Discharge Instructions (Signed)
Prednisone as prescribed.  Albuterol MDI: 2 puffs every 4 hours as needed for wheezing or difficulty breathing.  Return to the ER if symptoms significantly worsen or change.

## 2017-09-30 ENCOUNTER — Inpatient Hospital Stay (HOSPITAL_COMMUNITY)
Admission: EM | Admit: 2017-09-30 | Discharge: 2017-10-04 | DRG: 190 | Disposition: A | Discharge status: Home / Self Care | Payer: Self-pay | Attending: Family Medicine | Admitting: Family Medicine

## 2017-09-30 ENCOUNTER — Emergency Department (HOSPITAL_COMMUNITY): Payer: Self-pay

## 2017-09-30 ENCOUNTER — Encounter (HOSPITAL_COMMUNITY): Payer: Self-pay | Admitting: Emergency Medicine

## 2017-09-30 DIAGNOSIS — J441 Chronic obstructive pulmonary disease with (acute) exacerbation: Principal | ICD-10-CM | POA: Diagnosis present

## 2017-09-30 DIAGNOSIS — T380X5A Adverse effect of glucocorticoids and synthetic analogues, initial encounter: Secondary | ICD-10-CM | POA: Diagnosis not present

## 2017-09-30 DIAGNOSIS — F17200 Nicotine dependence, unspecified, uncomplicated: Secondary | ICD-10-CM | POA: Diagnosis present

## 2017-09-30 DIAGNOSIS — R918 Other nonspecific abnormal finding of lung field: Secondary | ICD-10-CM | POA: Diagnosis present

## 2017-09-30 DIAGNOSIS — I10 Essential (primary) hypertension: Secondary | ICD-10-CM | POA: Diagnosis present

## 2017-09-30 DIAGNOSIS — D72829 Elevated white blood cell count, unspecified: Secondary | ICD-10-CM | POA: Diagnosis not present

## 2017-09-30 DIAGNOSIS — R0902 Hypoxemia: Secondary | ICD-10-CM

## 2017-09-30 DIAGNOSIS — R402413 Glasgow coma scale score 13-15, at hospital admission: Secondary | ICD-10-CM | POA: Diagnosis present

## 2017-09-30 DIAGNOSIS — J189 Pneumonia, unspecified organism: Secondary | ICD-10-CM

## 2017-09-30 DIAGNOSIS — J9601 Acute respiratory failure with hypoxia: Secondary | ICD-10-CM | POA: Diagnosis present

## 2017-09-30 MED ORDER — METHYLPREDNISOLONE SODIUM SUCC 125 MG IJ SOLR
125.0000 mg | Freq: Once | INTRAMUSCULAR | Status: AC
  Administered 2017-09-30: 125 mg via INTRAVENOUS
  Filled 2017-09-30: qty 2

## 2017-09-30 MED ORDER — ALBUTEROL SULFATE (2.5 MG/3ML) 0.083% IN NEBU
5.0000 mg | INHALATION_SOLUTION | Freq: Once | RESPIRATORY_TRACT | Status: AC
  Administered 2017-09-30: 5 mg via RESPIRATORY_TRACT
  Filled 2017-09-30: qty 6

## 2017-09-30 MED ORDER — IPRATROPIUM BROMIDE 0.02 % IN SOLN
0.5000 mg | Freq: Once | RESPIRATORY_TRACT | Status: AC
  Administered 2017-09-30: 0.5 mg via RESPIRATORY_TRACT
  Filled 2017-09-30: qty 2.5

## 2017-09-30 MED ORDER — DEXTROSE 5 % IV SOLN
1.0000 g | Freq: Once | INTRAVENOUS | Status: AC
  Administered 2017-09-30: 1 g via INTRAVENOUS
  Filled 2017-09-30: qty 10

## 2017-09-30 MED ORDER — DEXTROSE 5 % IV SOLN
500.0000 mg | Freq: Once | INTRAVENOUS | Status: AC
  Administered 2017-10-01: 500 mg via INTRAVENOUS
  Filled 2017-09-30: qty 500

## 2017-09-30 NOTE — ED Triage Notes (Signed)
Per EMS, pt. From home with complaint of SOB, pt. Has COPD and asthma. Pt. Stated that he was having SOB and productive cough x 2 mos.alert and oriented x4. Received 125mg  IV solumedrol and on neb  via EMS.

## 2017-09-30 NOTE — ED Notes (Signed)
Bed: WA21 Expected date:  Expected time:  Means of arrival:  Comments: Room 21/EMS COPD exacerbation duoneb

## 2017-09-30 NOTE — ED Provider Notes (Addendum)
[facility] DEPT Provider Note   CSN: [id number] Arrival date & time: 09/30/17  2206     History   Chief Complaint Chief Complaint  Patient presents with  . Shortness of Breath    HPI Ray Griffin is a 51 y.o. male.  Patient with hx copd, c/o increased prod cough, increased wheezing and sob, for the past 2-3 days. Symptoms persistent, moderate, worse today. States hx same. Denies current abx use or current prednisone use. Patient states does not use mdi or nebulizer at home. +smoker. Denies sore throat or runny nose. No fever/chills. No leg swelling or pain.    The history is provided by the patient.  Shortness of Breath  Associated symptoms include cough and wheezing. Pertinent negatives include no fever, no headaches, no sore throat, no neck pain, no chest pain, no abdominal pain, no rash and no leg swelling.    Past Medical History:  Diagnosis Date  . Anxiety   . COPD (chronic obstructive pulmonary disease) ([place])   . Emphysema   . Hypertension   . Tachycardia     Patient Active Problem List   Diagnosis Date Noted  . Acute cholecystitis 12/04/2014  . COPD (chronic obstructive pulmonary disease) ([place]) 12/04/2014  . Gallstones 12/03/2014  . SINUSITIS - ACUTE-NOS 10/17/2009  . TOBACCO USE 01/08/2008  . PANIC ATTACK, ACUTE 06/18/2007  . ALLERGIC RHINITIS 06/18/2007  . COPD 06/18/2007  . DEPRESSION 04/04/2007  . Essential hypertension 04/04/2007    Past Surgical History:  Procedure Laterality Date  . CHOLECYSTECTOMY N/A 12/04/2014   Procedure: LAPAROSCOPIC CHOLECYSTECTOMY WITH POSSIBLE INTRAOPERATIVE CHOLANGIOGRAM;  Surgeon: Gayland Curry, MD;  Location: [facility];  Service: General;  Laterality: N/A;  . FACIAL COSMETIC SURGERY    . THORACOTOMY         Home Medications    Prior to Admission medications   Medication Sig Start Date End Date Taking? Authorizing Provider  doxycycline (VIBRAMYCIN) 100 MG capsule Take 1 capsule (100  mg total) 2 (two) times daily by mouth. Patient not taking: Reported on 09/16/2017 08/17/17   Ward, Ozella Almond, PA-C  predniSONE (DELTASONE) 10 MG tablet Take 2 tablets (20 mg total) by mouth 2 (two) times daily with a meal. Patient not taking: Reported on 09/30/2017 09/16/17   Veryl Speak, MD    Family History Family History  Problem Relation Age of Onset  . Diabetes Mother   . Heart failure Mother   . Diabetes Father   . Heart failure Father   . Bipolar disorder Brother     Social History Social History   Tobacco Use  . Smoking status: Current Every Day Smoker    Last attempt to quit: 07/19/2011    Years since quitting: 6.2  . Smokeless tobacco: Never Used  Substance Use Topics  . Alcohol use: No    Comment: recently quit 3 mths ago  . Drug use: No     Allergies   Patient has no known allergies.   Review of Systems Review of Systems  Constitutional: Negative for fever.  HENT: Negative for sore throat.   Eyes: Negative for redness.  Respiratory: Positive for cough, shortness of breath and wheezing.   Cardiovascular: Negative for chest pain and leg swelling.  Gastrointestinal: Negative for abdominal pain.  Genitourinary: Negative for flank pain.  Musculoskeletal: Negative for back pain and neck pain.  Skin: Negative for rash.  Neurological: Negative for headaches.  Hematological: Does not bruise/bleed easily.  Psychiatric/Behavioral: Negative for confusion.  Physical Exam Updated Vital Signs BP (!) 160/89 (BP Location: Right Arm)   Pulse 81   Temp 97.7 F (36.5 C) (Oral)   Resp (!) 25   Ht 1.854 m (6\' 1" )   Wt 108.9 kg (240 lb)   SpO2 96%   BMI 31.66 kg/m   Physical Exam  Constitutional: He appears well-developed and well-nourished. No distress.  HENT:  Mouth/Throat: Oropharynx is clear and moist.  Eyes: Conjunctivae are normal.  Neck: Neck supple. No JVD present. No tracheal deviation present.  Cardiovascular: Normal rate, regular  rhythm, normal heart sounds and intact distal pulses. Exam reveals no gallop and no friction rub.  No murmur heard. Pulmonary/Chest: Effort normal. No accessory muscle usage. No respiratory distress. He has wheezes. He has rales.  Abdominal: Soft. Bowel sounds are normal. He exhibits no distension. There is no tenderness.  Musculoskeletal: He exhibits no edema or tenderness.  Neurological: He is alert.  Skin: Skin is warm and dry. No rash noted. He is not diaphoretic.  Psychiatric: He has a normal mood and affect.  Nursing note and vitals reviewed.    ED Treatments / Results  Labs (all labs ordered are listed, but only abnormal results are displayed) Labs Reviewed - No data to display  EKG  EKG Interpretation None       Radiology Dg Chest [facility] 1 View  Result Date: 09/30/2017 CLINICAL DATA:  51 y/o M; shortness of breath and productive cough. History of COPD. Current smoker. EXAM: PORTABLE CHEST 1 VIEW COMPARISON:  09/16/2017 chest radiograph. FINDINGS: COPD. Biapical emphysema and pleuroparenchymal scarring. Surgical changes in right lung apex. Left mid and lower lung zone consolidation. Stable mildly enlarged cardiac silhouette. No acute osseous abnormality is evident. IMPRESSION: Left mid and lower lung zone consolidation, probably pneumonia. COPD. Electronically Signed   By: Kristine Garbe M.D.   On: 09/30/2017 23:14    Procedures Procedures (including critical care time)  Medications Ordered in ED Medications  albuterol (PROVENTIL) (2.5 MG/3ML) 0.083% nebulizer solution 5 mg (not administered)  ipratropium (ATROVENT) nebulizer solution 0.5 mg (not administered)  methylPREDNISolone sodium succinate (SOLU-MEDROL) 125 mg/2 mL injection 125 mg (not administered)     Initial Impression / Assessment and Plan / ED Course  I have reviewed the triage vital signs and the nursing notes.  Pertinent labs & imaging results that were available during my care of the patient  were reviewed by me and considered in my medical decision making (see chart for details).  Iv ns. Albuterol and atrovent neb. Persistent wheezing. Cxr. Albuterol and atrovent neb. Solumedrol iv.   Reviewed nursing notes and prior charts for additional history.   Recheck persistent wheezing.   Room air pulse ox 88%. Persistent increased wob. Significant pna on cxr.  Will admit.  Rocephin and zithromax iv.  Additional albuterol and atrovent neb.  Hospitalists consulted for admission.    Final Clinical Impressions(s) / ED Diagnoses   Final diagnoses:  None    ED Discharge Orders    None           Lajean Saver, MD 09/30/17 2349

## 2017-10-01 ENCOUNTER — Encounter (HOSPITAL_COMMUNITY): Payer: Self-pay | Admitting: Radiology

## 2017-10-01 ENCOUNTER — Other Ambulatory Visit: Payer: Self-pay

## 2017-10-01 ENCOUNTER — Emergency Department (HOSPITAL_COMMUNITY): Payer: Self-pay

## 2017-10-01 DIAGNOSIS — J441 Chronic obstructive pulmonary disease with (acute) exacerbation: Secondary | ICD-10-CM | POA: Diagnosis present

## 2017-10-01 DIAGNOSIS — R918 Other nonspecific abnormal finding of lung field: Secondary | ICD-10-CM | POA: Diagnosis present

## 2017-10-01 LAB — BASIC METABOLIC PANEL
Anion gap: 6 (ref 5–15)
BUN: 17 mg/dL (ref 6–20)
CO2: 23 mmol/L (ref 22–32)
Calcium: 8.9 mg/dL (ref 8.9–10.3)
Chloride: 108 mmol/L (ref 101–111)
Creatinine, Ser: 0.83 mg/dL (ref 0.61–1.24)
GFR calc Af Amer: >60 mL/min (ref >60)
GFR calc non Af Amer: >60 mL/min (ref >60)
Glucose, Bld: 144 mg/dL — ABNORMAL HIGH (ref 65–99)
Potassium: 3.9 mmol/L (ref 3.5–5.1)
Sodium: 137 mmol/L (ref 135–145)

## 2017-10-01 LAB — CBC
HCT: 47.3 % (ref 39.0–52.0)
Hemoglobin: 15.7 g/dL (ref 13.0–17.0)
MCH: 28.3 pg (ref 26.0–34.0)
MCHC: 33.2 g/dL (ref 30.0–36.0)
MCV: 85.4 fL (ref 78.0–100.0)
Platelets: 236 10*3/uL (ref 150–400)
RBC: 5.54 MIL/uL (ref 4.22–5.81)
RDW: 13.6 % (ref 11.5–15.5)
WBC: 12.7 10*3/uL — ABNORMAL HIGH (ref 4.0–10.5)

## 2017-10-01 LAB — INFLUENZA PANEL BY PCR (TYPE A & B)
Influenza A By PCR: NEGATIVE
Influenza B By PCR: NEGATIVE

## 2017-10-01 LAB — HIV ANTIBODY (ROUTINE TESTING W REFLEX): HIV Screen 4th Generation wRfx: NONREACTIVE

## 2017-10-01 MED ORDER — IPRATROPIUM-ALBUTEROL 0.5-2.5 (3) MG/3ML IN SOLN
3.0000 mL | RESPIRATORY_TRACT | Status: DC
  Administered 2017-10-01: 3 mL via RESPIRATORY_TRACT
  Filled 2017-10-01 (×2): qty 3

## 2017-10-01 MED ORDER — NICOTINE 14 MG/24HR TD PT24
14.0000 mg | MEDICATED_PATCH | Freq: Every day | TRANSDERMAL | Status: DC
  Administered 2017-10-01 – 2017-10-04 (×4): 14 mg via TRANSDERMAL
  Filled 2017-10-01 (×4): qty 1

## 2017-10-01 MED ORDER — ALBUTEROL SULFATE (2.5 MG/3ML) 0.083% IN NEBU
5.0000 mg | INHALATION_SOLUTION | RESPIRATORY_TRACT | Status: DC
  Administered 2017-10-01 (×3): 5 mg via RESPIRATORY_TRACT
  Filled 2017-10-01 (×3): qty 6

## 2017-10-01 MED ORDER — IPRATROPIUM-ALBUTEROL 0.5-2.5 (3) MG/3ML IN SOLN
3.0000 mL | Freq: Four times a day (QID) | RESPIRATORY_TRACT | Status: DC
  Administered 2017-10-01: 3 mL via RESPIRATORY_TRACT
  Filled 2017-10-01: qty 3

## 2017-10-01 MED ORDER — PREDNISONE 20 MG PO TABS
40.0000 mg | ORAL_TABLET | Freq: Every day | ORAL | Status: DC
  Administered 2017-10-01: 40 mg via ORAL
  Filled 2017-10-01: qty 2

## 2017-10-01 MED ORDER — IOPAMIDOL (ISOVUE-300) INJECTION 61%
75.0000 mL | Freq: Once | INTRAVENOUS | Status: AC | PRN
  Administered 2017-10-01: 75 mL via INTRAVENOUS

## 2017-10-01 MED ORDER — ALBUTEROL SULFATE (2.5 MG/3ML) 0.083% IN NEBU: 2.5000 mg | INHALATION_SOLUTION | RESPIRATORY_TRACT | Status: DC | PRN

## 2017-10-01 MED ORDER — IPRATROPIUM BROMIDE 0.02 % IN SOLN
0.5000 mg | RESPIRATORY_TRACT | Status: DC
  Administered 2017-10-01 (×3): 0.5 mg via RESPIRATORY_TRACT
  Filled 2017-10-01 (×3): qty 2.5

## 2017-10-01 MED ORDER — ACETAMINOPHEN 325 MG PO TABS: 650.0000 mg | ORAL_TABLET | Freq: Four times a day (QID) | ORAL | Status: DC | PRN

## 2017-10-01 MED ORDER — IOPAMIDOL (ISOVUE-300) INJECTION 61%
INTRAVENOUS | Status: AC
  Filled 2017-10-01: qty 75

## 2017-10-01 MED ORDER — NICOTINE 21 MG/24HR TD PT24: 21.0000 mg | MEDICATED_PATCH | Freq: Every day | TRANSDERMAL | Status: DC

## 2017-10-01 MED ORDER — ACETAMINOPHEN 650 MG RE SUPP: 650.0000 mg | Freq: Four times a day (QID) | RECTAL | Status: DC | PRN

## 2017-10-01 MED ORDER — DEXTROSE 5 % IV SOLN
1.0000 g | INTRAVENOUS | Status: DC
  Administered 2017-10-01 – 2017-10-02 (×2): 1 g via INTRAVENOUS
  Filled 2017-10-01 (×3): qty 10

## 2017-10-01 MED ORDER — IPRATROPIUM BROMIDE 0.02 % IN SOLN: 0.5000 mg | Freq: Four times a day (QID) | RESPIRATORY_TRACT | Status: DC

## 2017-10-01 MED ORDER — ENOXAPARIN SODIUM 40 MG/0.4ML ~~LOC~~ SOLN
40.0000 mg | SUBCUTANEOUS | Status: DC
  Administered 2017-10-01: 40 mg via SUBCUTANEOUS
  Filled 2017-10-01: qty 0.4

## 2017-10-01 MED ORDER — ONDANSETRON HCL 4 MG/2ML IJ SOLN: 4.0000 mg | Freq: Four times a day (QID) | INTRAMUSCULAR | Status: DC | PRN

## 2017-10-01 MED ORDER — ONDANSETRON HCL 4 MG PO TABS: 4.0000 mg | ORAL_TABLET | Freq: Four times a day (QID) | ORAL | Status: DC | PRN

## 2017-10-01 MED ORDER — IPRATROPIUM BROMIDE 0.02 % IN SOLN
0.5000 mg | RESPIRATORY_TRACT | Status: DC | PRN
Start: 2017-10-01 — End: 2017-10-03

## 2017-10-01 MED ORDER — METHYLPREDNISOLONE SODIUM SUCC 125 MG IJ SOLR
60.0000 mg | Freq: Two times a day (BID) | INTRAMUSCULAR | Status: DC
  Administered 2017-10-01 – 2017-10-02 (×4): 60 mg via INTRAVENOUS
  Filled 2017-10-01 (×4): qty 2

## 2017-10-01 MED ORDER — DIPHENHYDRAMINE HCL 25 MG PO CAPS
25.0000 mg | ORAL_CAPSULE | Freq: Once | ORAL | Status: AC
  Administered 2017-10-02: 25 mg via ORAL
  Filled 2017-10-01: qty 1

## 2017-10-01 MED ORDER — MOMETASONE FURO-FORMOTEROL FUM 200-5 MCG/ACT IN AERO
2.0000 | INHALATION_SPRAY | Freq: Two times a day (BID) | RESPIRATORY_TRACT | Status: DC
  Filled 2017-10-01: qty 8.8

## 2017-10-01 NOTE — H&P (Signed)
History and Physical    Ray Griffin [id number]:[id number] DOB: 1966-06-18 DOA: 09/30/2017  PCP: Patient, No Pcp Per  Patient coming from: Home  I have personally briefly reviewed patient's old medical records in [place]  Chief Complaint: SOB  HPI: Ray Griffin is a 51 y.o. male with medical history significant of Emphysema, HTN, EtOH use in past but nothing to drink for past 3 years.  Patient presents to the ED with c/o increased wheezing in SOB.  Symptoms are moderate, persistent, worsened today.  No current ABx nor current prednisone use.  No MDI nor inhaler at home.  Is still smoking.   ED Course: CXR initially worrisome for PNA, but CT chest reveals that this is just scaring without focal consolidation.  Has severe emphysema on CT chest.   Review of Systems: As per HPI otherwise 10 point review of systems negative.   Past Medical History:  Diagnosis Date  . Anxiety   . COPD (chronic obstructive pulmonary disease) ([place])   . Emphysema   . Hypertension   . Tachycardia     Past Surgical History:  Procedure Laterality Date  . CHOLECYSTECTOMY N/A 12/04/2014   Procedure: LAPAROSCOPIC CHOLECYSTECTOMY WITH POSSIBLE INTRAOPERATIVE CHOLANGIOGRAM;  Surgeon: Gayland Curry, MD;  Location: [facility];  Service: General;  Laterality: N/A;  . FACIAL COSMETIC SURGERY    . THORACOTOMY       reports that he has been smoking.  he has never used smokeless tobacco. He reports that he does not drink alcohol or use drugs.  No Known Allergies  Family History  Problem Relation Age of Onset  . Diabetes Mother   . Heart failure Mother   . Diabetes Father   . Heart failure Father   . Bipolar disorder Brother      Prior to Admission medications   Medication Sig Start Date End Date Taking? Authorizing Provider  doxycycline (VIBRAMYCIN) 100 MG capsule Take 1 capsule (100 mg total) 2 (two) times daily by mouth. Patient not taking: Reported on 09/16/2017 08/17/17   Ward, Ozella Almond,  PA-C  predniSONE (DELTASONE) 10 MG tablet Take 2 tablets (20 mg total) by mouth 2 (two) times daily with a meal. Patient not taking: Reported on 09/30/2017 09/16/17   Veryl Speak, MD    Physical Exam: Vitals:   10/01/17 0100 10/01/17 0130 10/01/17 0146 10/01/17 0219  BP: 140/83  118/72 104/65  Pulse: 75 79 69 83  Resp: (!) 25 (!) 21 17 15   Temp:      TempSrc:      SpO2: 90% 93% 90% 90%  Weight:      Height:        Constitutional: NAD, calm, comfortable Eyes: PERRL, lids and conjunctivae normal ENMT: Mucous membranes are moist. Posterior pharynx clear of any exudate or lesions.Normal dentition.  Neck: normal, supple, no masses, no thyromegaly Respiratory: diffuse wheezes Cardiovascular: Regular rate and rhythm, no murmurs / rubs / gallops. No extremity edema. 2+ pedal pulses. No carotid bruits.  Abdomen: no tenderness, no masses palpated. No hepatosplenomegaly. Bowel sounds positive.  Musculoskeletal: no clubbing / cyanosis. No joint deformity upper and lower extremities. Good ROM, no contractures. Normal muscle tone.  Skin: no rashes, lesions, ulcers. No induration Neurologic: CN 2-12 grossly intact. Sensation intact, DTR normal. Strength 5/5 in all 4.  Psychiatric: Normal judgment and insight. Alert and oriented x 3. Normal mood.    Labs on Admission: I have personally reviewed following labs and imaging studies  CBC: Recent  Labs  Lab 09/30/17 2339  WBC 12.7*  HGB 15.7  HCT 47.3  MCV 85.4  PLT 371   Basic Metabolic Panel: Recent Labs  Lab 09/30/17 2339  NA 137  K 3.9  CL 108  CO2 23  GLUCOSE 144*  BUN 17  CREATININE 0.83  CALCIUM 8.9   GFR: Estimated Creatinine Clearance: 134.7 mL/min (by C-G formula based on SCr of 0.83 mg/dL). Liver Function Tests: No results for input(s): AST, ALT, ALKPHOS, BILITOT, PROT, ALBUMIN in the last 168 hours. No results for input(s): LIPASE, AMYLASE in the last 168 hours. No results for input(s): AMMONIA in the last 168  hours. Coagulation Profile: No results for input(s): INR, PROTIME in the last 168 hours. Cardiac Enzymes: No results for input(s): CKTOTAL, CKMB, CKMBINDEX, TROPONINI in the last 168 hours. BNP (last 3 results) No results for input(s): PROBNP in the last 8760 hours. HbA1C: No results for input(s): HGBA1C in the last 72 hours. CBG: No results for input(s): GLUCAP in the last 168 hours. Lipid Profile: No results for input(s): CHOL, HDL, LDLCALC, TRIG, CHOLHDL, LDLDIRECT in the last 72 hours. Thyroid Function Tests: No results for input(s): TSH, T4TOTAL, FREET4, T3FREE, THYROIDAB in the last 72 hours. Anemia Panel: No results for input(s): VITAMINB12, FOLATE, FERRITIN, TIBC, IRON, RETICCTPCT in the last 72 hours. Urine analysis:    Component Value Date/Time   COLORURINE YELLOW 12/03/2014 2036   APPEARANCEUR CLEAR 12/03/2014 2036   LABSPEC 1.020 12/03/2014 2036   PHURINE 8.5 (H) 12/03/2014 2036   GLUCOSEU NEGATIVE 12/03/2014 2036   HGBUR NEGATIVE 12/03/2014 2036   BILIRUBINUR NEGATIVE 12/03/2014 2036   KETONESUR NEGATIVE 12/03/2014 2036   PROTEINUR 100 (A) 12/03/2014 2036   UROBILINOGEN 1.0 12/03/2014 2036   NITRITE NEGATIVE 12/03/2014 2036   LEUKOCYTESUR NEGATIVE 12/03/2014 2036    Radiological Exams on Admission: Ct Chest W Contrast  Result Date: 10/01/2017 CLINICAL DATA:  51 y/o  M; COPD and asthma.  Productive cough. EXAM: CT CHEST WITH CONTRAST TECHNIQUE: Multidetector CT imaging of the chest was performed during intravenous contrast administration. CONTRAST:  60mL ISOVUE-300 IOPAMIDOL (ISOVUE-300) INJECTION 61% COMPARISON:  09/30/2017 chest radiograph. FINDINGS: Cardiovascular: No significant vascular findings. Normal heart size. No pericardial effusion. Mediastinum/Nodes: No enlarged mediastinal, hilar, or axillary lymph nodes. Thyroid gland, trachea, and esophagus demonstrate no significant findings. Lungs/Pleura: Pulmonary nodules 1. Right middle lobe, 8 mm, series 5, image  104. 2. Right middle lobe, 10 mm, series 5, image 116. 3. Right lower lobe, 6 mm, series 5, image 101. 4. Right lower lobe, 6 mm, series 5, image 106. Moderate to severe centrilobular emphysema with upper lobe predominance and left lung apex bullous changes. Left mid and lower lung zone opacities on prior radiograph correspond to multiple linear opacities in the left upper lobe and lower lobe most likely representing scarring and atelectasis. Multiple areas of scarring and atelectasis are present in the periphery of right lung as well. No discrete consolidation is identified. No pleural effusion or pneumothorax. Upper Abdomen: Cholecystectomy. Musculoskeletal: No acute osseous abnormality. Multilevel bridging syndesmophytes, probably spondyloarthropathy. IMPRESSION: 1. Left mid and lower lung zone opacities on prior radiograph corresponds to multiple linear opacities in the left upper lower lobes most compatible with scarring and atelectasis. No discrete airspace consolidation to suggest pneumonia. 2. Moderate to severe centrilobular emphysema with bullous changes of left lung apex. 3. Multiple pulmonary nodules measuring up to 10 mm in right middle lobe. Non-contrast chest CT at 3-6 months is recommended. If the nodules are stable  at time of repeat CT, then future CT at 18-24 months (from today's scan) is considered optional for low-risk patients, but is recommended for high-risk patients. This recommendation follows the consensus statement: Guidelines for Management of Incidental Pulmonary Nodules Detected on CT Images: From the Fleischner Society 2017; Radiology 2017; 284:228-243. Electronically Signed   By: Kristine Garbe M.D.   On: 10/01/2017 01:49   Dg Chest Port 1 View  Result Date: 09/30/2017 CLINICAL DATA:  51 y/o M; shortness of breath and productive cough. History of COPD. Current smoker. EXAM: PORTABLE CHEST 1 VIEW COMPARISON:  09/16/2017 chest radiograph. FINDINGS: COPD. Biapical  emphysema and pleuroparenchymal scarring. Surgical changes in right lung apex. Left mid and lower lung zone consolidation. Stable mildly enlarged cardiac silhouette. No acute osseous abnormality is evident. IMPRESSION: Left mid and lower lung zone consolidation, probably pneumonia. COPD. Electronically Signed   By: Kristine Garbe M.D.   On: 09/30/2017 23:14    EKG: Independently reviewed.  Assessment/Plan Principal Problem:   COPD with acute exacerbation (HCC) Active Problems:   Pulmonary nodules/lesions, multiple    1. COPD with acute exacerbation - 1. COPD pathway 2. Albuterol PRN 3. ipratropium scheduled 4. Dulera 5. Prednisone daily (got 125 solumedrol in ED). 6. Will leave patient on Rocephin as ABx 7. Offered nicotine patch but patient declined 8. Probably needs follow up at a minimum with Pulm given the lung disease 2. Pulmonary nodules- 1. Needs follow up CT in 3-6 months at a minimum 2. Probably warrants pulmonologist survaliance  DVT prophylaxis: Lovenox Code Status: Full Family Communication: No family inroom Disposition Plan: Home after admit Consults called: none Admission status: Place in 11, [place] [facility] Pager [phone]  If 7AM-7PM, please contact day team taking care of patient [email or web address] Password [email or web address]  10/01/2017, 2:21 AM

## 2017-10-01 NOTE — Progress Notes (Signed)
Patient admitted after midnight. For details please refer to admission note done 10/01/2017. Patient presented with worsening shortness of breath and wheezing over the past couple of days prior to the admission. His chest x-ray was worrisome for pneumonia. He was started on empiric azithromycin and Rocephin. For COPD, will change nebulizer treatment to include albuterol and Atrovent every 4 hours scheduled as well as every 2 hours as needed for shortness of breath or wheezing. Add Solu-Medrol 60 mg IV every 12 hours. If this regimen does not help and patient continues to have shortness of breath and will consult pulmonary.  Leisa Lenz [facility] [phone]

## 2017-10-01 NOTE — ED Notes (Signed)
Patient transported to CT 

## 2017-10-02 LAB — BASIC METABOLIC PANEL
Anion gap: 6 (ref 5–15)
BUN: 15 mg/dL (ref 6–20)
CO2: 24 mmol/L (ref 22–32)
Calcium: 8.9 mg/dL (ref 8.9–10.3)
Chloride: 108 mmol/L (ref 101–111)
Creatinine, Ser: 0.72 mg/dL (ref 0.61–1.24)
GFR calc Af Amer: >60 mL/min (ref >60)
GFR calc non Af Amer: >60 mL/min (ref >60)
Glucose, Bld: 173 mg/dL — ABNORMAL HIGH (ref 65–99)
Potassium: 4 mmol/L (ref 3.5–5.1)
Sodium: 138 mmol/L (ref 135–145)

## 2017-10-02 LAB — CBC
HCT: 46.2 % (ref 39.0–52.0)
Hemoglobin: 15.1 g/dL (ref 13.0–17.0)
MCH: 28 pg (ref 26.0–34.0)
MCHC: 32.7 g/dL (ref 30.0–36.0)
MCV: 85.6 fL (ref 78.0–100.0)
Platelets: 238 10*3/uL (ref 150–400)
RBC: 5.4 MIL/uL (ref 4.22–5.81)
RDW: 13.9 % (ref 11.5–15.5)
WBC: 18.6 10*3/uL — ABNORMAL HIGH (ref 4.0–10.5)

## 2017-10-02 MED ORDER — AZITHROMYCIN 500 MG IV SOLR
500.0000 mg | INTRAVENOUS | Status: DC
  Administered 2017-10-02: 500 mg via INTRAVENOUS
  Filled 2017-10-02 (×2): qty 500

## 2017-10-02 MED ORDER — ALBUTEROL SULFATE (2.5 MG/3ML) 0.083% IN NEBU: 2.5000 mg | INHALATION_SOLUTION | RESPIRATORY_TRACT | Status: DC | PRN

## 2017-10-02 MED ORDER — IPRATROPIUM-ALBUTEROL 0.5-2.5 (3) MG/3ML IN SOLN
3.0000 mL | RESPIRATORY_TRACT | Status: DC
  Administered 2017-10-02 – 2017-10-03 (×11): 3 mL via RESPIRATORY_TRACT
  Filled 2017-10-02 (×11): qty 3

## 2017-10-02 MED ORDER — ZOLPIDEM TARTRATE 5 MG PO TABS
5.0000 mg | ORAL_TABLET | Freq: Once | ORAL | Status: AC
  Administered 2017-10-02: 5 mg via ORAL
  Filled 2017-10-02: qty 1

## 2017-10-02 NOTE — Progress Notes (Signed)
Patient ID: Ray Griffin, male   DOB: 03/23/1966, 51 y.o.   MRN: [id number]  PROGRESS NOTE    Ray Griffin  [id number]:[id number] DOB: 1966-02-18 DOA: 09/30/2017  PCP: Patient, No Pcp Per   Brief Narrative:  51 year old male with history of COPD / emphysema, prior thoracotomy for ruptured bleb and pneumothorax. Patient presented with worsening shortness of breath and wheezing over the past couple of days prior to the admission. His chest x-ray was worrisome for pneumonia. He was started on empiric azithromycin and Rocephin.  Assessment & Plan:   Principal Problem:   COPD with acute exacerbation ([place]) / Acute respiratory failure with hypoxia  - Continue Atrovent and Albuterol every2 hours as needed for shortness of breath or wheezing - Continue Duoneb every 4 hours scheduled - Continue solumedrol 60 mg IV Q 12 hours - Continue oxygen support via [place] to keep O2 saturation above 90%  Active Problems:   Community acquired pneumonia / Leukocytosis  - Continue azithromycin and rocephin    Pulmonary nodules/lesions, multiple - CT chest showed emphysema and multiple pulmonary nodules measuring up to 10 mm in right middle lobe. Non-contrast chest CT at 3-6 months is recommended. If the nodules are stable at time of repeat CT, then future CT at 18-24 months (from today's scan) is considered optional for low-risk patients, but is recommended for high-risk patients    Tobacco use - Nicotine patch ordered - Counseled on cessation    DVT prophylaxis: SCD's Code Status: full code  Family Communication: no family at the bedside  Disposition Plan: Home once resp status stable   Consultants:   None  Procedures:   None   Antimicrobials:   Rocephin 12/30 -->   Azithromycin 12/31 -->   Subjective: Says he feels better.  Objective: Vitals:   10/01/17 2107 10/02/17 0326 10/02/17 0600 10/02/17 0900  BP: 133/69  137/74   Pulse: 76  79   Resp: 20  20   Temp: (!) 97.5 F (36.4 C)   97.8 F (36.6 C)   TempSrc: Axillary  Oral   SpO2: 96% 95% 96% 94%  Weight:      Height:        Intake/Output Summary (Last 24 hours) at 10/02/2017 1128 Last data filed at 10/01/2017 2130 Gross per 24 hour  Intake 530 ml  Output -  Net 530 ml   Filed Weights   09/30/17 2221  Weight: 108.9 kg (240 lb)    Examination:  General exam: Appears calm and comfortable  Respiratory system: Wheezing in upper and mid lung lobes  Cardiovascular system: S1 & S2 heard, RRR.  Gastrointestinal system: Abdomen is nondistended, soft and nontender. No organomegaly or masses felt. Normal bowel sounds heard. Central nervous system: Alert and oriented. No focal neurological deficits. Extremities: Symmetric 5 x 5 power. Skin: No rashes, lesions or ulcers Psychiatry: Judgement and insight appear normal. Mood & affect appropriate.   Data Reviewed: I have personally reviewed following labs and imaging studies  CBC: Recent Labs  Lab 09/30/17 2339 10/02/17 0544  WBC 12.7* 18.6*  HGB 15.7 15.1  HCT 47.3 46.2  MCV 85.4 85.6  PLT 236 998   Basic Metabolic Panel: Recent Labs  Lab 09/30/17 2339 10/02/17 0544  NA 137 138  K 3.9 4.0  CL 108 108  CO2 23 24  GLUCOSE 144* 173*  BUN 17 15  CREATININE 0.83 0.72  CALCIUM 8.9 8.9   GFR: Estimated Creatinine Clearance: 139.8 mL/min (by C-G formula  based on SCr of 0.72 mg/dL). Liver Function Tests: No results for input(s): AST, ALT, ALKPHOS, BILITOT, PROT, ALBUMIN in the last 168 hours. No results for input(s): LIPASE, AMYLASE in the last 168 hours. No results for input(s): AMMONIA in the last 168 hours. Coagulation Profile: No results for input(s): INR, PROTIME in the last 168 hours. Cardiac Enzymes: No results for input(s): CKTOTAL, CKMB, CKMBINDEX, TROPONINI in the last 168 hours. BNP (last 3 results) No results for input(s): PROBNP in the last 8760 hours. HbA1C: No results for input(s): HGBA1C in the last 72 hours. CBG: No results for  input(s): GLUCAP in the last 168 hours. Lipid Profile: No results for input(s): CHOL, HDL, LDLCALC, TRIG, CHOLHDL, LDLDIRECT in the last 72 hours. Thyroid Function Tests: No results for input(s): TSH, T4TOTAL, FREET4, T3FREE, THYROIDAB in the last 72 hours. Anemia Panel: No results for input(s): VITAMINB12, FOLATE, FERRITIN, TIBC, IRON, RETICCTPCT in the last 72 hours. Urine analysis:    Component Value Date/Time   COLORURINE YELLOW 12/03/2014 2036   APPEARANCEUR CLEAR 12/03/2014 2036   LABSPEC 1.020 12/03/2014 2036   PHURINE 8.5 (H) 12/03/2014 2036   GLUCOSEU NEGATIVE 12/03/2014 2036   HGBUR NEGATIVE 12/03/2014 2036   BILIRUBINUR NEGATIVE 12/03/2014 2036   KETONESUR NEGATIVE 12/03/2014 2036   PROTEINUR 100 (A) 12/03/2014 2036   UROBILINOGEN 1.0 12/03/2014 2036   NITRITE NEGATIVE 12/03/2014 2036   LEUKOCYTESUR NEGATIVE 12/03/2014 2036   Sepsis Labs: @LABRCNTIP (procalcitonin:4,lacticidven:4)   )No results found for this or any previous visit (from the past 240 hour(s)).    Radiology Studies: Ct Chest W Contrast Result Date: 10/01/2017 :1. Left mid and lower lung zone opacities on prior radiograph corresponds to multiple linear opacities in the left upper lower lobes most compatible with scarring and atelectasis. No discrete airspace consolidation to suggest pneumonia. 2. Moderate to severe centrilobular emphysema with bullous changes of left lung apex. 3. Multiple pulmonary nodules measuring up to 10 mm in right middle lobe. Non-contrast chest CT at 3-6 months is recommended. If the nodules are stable at time of repeat CT, then future CT at 18-24 months (from today's scan) is considered optional for low-risk patients, but is recommended for high-risk patients.  Dg Chest Port 1 View Result Date: 09/30/2017 Left mid and lower lung zone consolidation, probably pneumonia. COPD. Electronically Signed   By: Kristine Garbe M.D.   On: 09/30/2017 23:14     Scheduled Meds: .  ipratropium-albuterol  3 mL Nebulization Q4H  . methylPREDNISolone (SOLU-MEDROL) injection  60 mg Intravenous Q12H  . nicotine  14 mg Transdermal Daily   Continuous Infusions: . cefTRIAXone (ROCEPHIN)  IV Stopped (10/01/17 2130)     LOS: 1 day    Time spent: 25 minutes  Greater than 50% of the time spent on counseling and coordinating the care.   Leisa Lenz, MD [facility] Pager [phone]  If 7PM-7AM, please contact night-coverage [email or web address] Password [email or web address] 10/02/2017, 11:28 AM

## 2017-10-02 NOTE — Progress Notes (Signed)
Nutrition Brief Note  Patient identified on the Malnutrition Screening Tool (MST) Report  Wt Readings from Last 15 Encounters:  09/30/17 240 lb (108.9 kg)  11/28/16 238 lb (108 kg)  12/04/14 237 lb 11.2 oz (107.8 kg)  06/02/13 240 lb (108.9 kg)  05/07/12 218 lb (98.9 kg)  08/26/11 225 lb (102.1 kg)   Pt with PMH significant for COPD, anxiety, and HTN. Presents this admission with COPD exacerbation. Appetite stable. Weight stable per last 15 encounters.   Body mass index is 31.66 kg/m. Patient meets criteria for obese based on current BMI.   Current diet order is regular, patient is consuming approximately 100% of meals at this time. Labs and medications reviewed.   No nutrition interventions warranted at this time. If nutrition issues arise, please consult RD.   Ray Griffin RD, LDN Clinical Nutrition Pager # [phone]

## 2017-10-03 LAB — BASIC METABOLIC PANEL
Anion gap: 6 (ref 5–15)
BUN: 18 mg/dL (ref 6–20)
CO2: 27 mmol/L (ref 22–32)
Calcium: 8.9 mg/dL (ref 8.9–10.3)
Chloride: 106 mmol/L (ref 101–111)
Creatinine, Ser: 0.76 mg/dL (ref 0.61–1.24)
GFR calc Af Amer: >60 mL/min (ref >60)
GFR calc non Af Amer: >60 mL/min (ref >60)
Glucose, Bld: 134 mg/dL — ABNORMAL HIGH (ref 65–99)
Potassium: 4.4 mmol/L (ref 3.5–5.1)
Sodium: 139 mmol/L (ref 135–145)

## 2017-10-03 LAB — CBC
HCT: 45.9 % (ref 39.0–52.0)
Hemoglobin: 15 g/dL (ref 13.0–17.0)
MCH: 27.9 pg (ref 26.0–34.0)
MCHC: 32.7 g/dL (ref 30.0–36.0)
MCV: 85.5 fL (ref 78.0–100.0)
Platelets: 233 10*3/uL (ref 150–400)
RBC: 5.37 MIL/uL (ref 4.22–5.81)
RDW: 14.1 % (ref 11.5–15.5)
WBC: 17.3 10*3/uL — ABNORMAL HIGH (ref 4.0–10.5)

## 2017-10-03 MED ORDER — METHYLPREDNISOLONE SODIUM SUCC 40 MG IJ SOLR
40.0000 mg | Freq: Two times a day (BID) | INTRAMUSCULAR | Status: DC
  Administered 2017-10-03 – 2017-10-04 (×2): 40 mg via INTRAVENOUS
  Filled 2017-10-03 (×2): qty 1

## 2017-10-03 MED ORDER — IPRATROPIUM-ALBUTEROL 0.5-2.5 (3) MG/3ML IN SOLN
3.0000 mL | Freq: Four times a day (QID) | RESPIRATORY_TRACT | Status: DC
  Administered 2017-10-04: 3 mL via RESPIRATORY_TRACT
  Filled 2017-10-03: qty 3

## 2017-10-03 MED ORDER — DIPHENHYDRAMINE HCL 25 MG PO CAPS
25.0000 mg | ORAL_CAPSULE | Freq: Every evening | ORAL | Status: DC | PRN
  Administered 2017-10-03: 25 mg via ORAL
  Filled 2017-10-03: qty 1

## 2017-10-03 MED ORDER — LEVOFLOXACIN 500 MG PO TABS
500.0000 mg | ORAL_TABLET | Freq: Every day | ORAL | Status: DC
  Administered 2017-10-04: 500 mg via ORAL
  Filled 2017-10-03: qty 1

## 2017-10-03 MED ORDER — GUAIFENESIN-DM 100-10 MG/5ML PO SYRP
5.0000 mL | ORAL_SOLUTION | ORAL | Status: DC | PRN
  Administered 2017-10-03 – 2017-10-04 (×3): 5 mL via ORAL
  Filled 2017-10-03 (×3): qty 10

## 2017-10-03 NOTE — Progress Notes (Addendum)
PROGRESS NOTE    BERTHEL BAGNALL  TDV:[id number] DOB: 1965/11/28 DOA: 09/30/2017 PCP: Patient, No Pcp Per   Brief Narrative:  Patient is a 52 year old male with past medical history significant for COPD, hypertension who presents to the emergency department with complaints of increased wheezing, shortness of breath.  The patient does not have a PCP and he does not take any inhalers at home for his COPD.   patient is a active smoker.  Patient was admitted for the management of COPD exacerbation.  He is initial chest x-ray was worrisome for pneumonia but CT chest reveals that it was just scaring without focal consolidation.  Assessment & Plan:   Principal Problem:   COPD with acute exacerbation ([place]) Active Problems:   Pulmonary nodules/lesions, multiple   COPD exacerbation (HCC)  COPD exacerbation: Respiratory status improving.  Patient is saturating normally on room air.  Still complains of shortness of breath and is coughing. We will continue IV steroids and bronchodilators. Patient has been on oral Levaquin since today. Patient is not on any inhalers at home .Patient needs at least albuterol inhaler on discharge along with prednisone.  He needs follow-up as an outpatient.  Since he does not have a PCP, we have requested for case manager consultation.  Patient needs pulmonology follow-up as an outpatient and outpatient pulmonary function test.  Pulmonary nodules: CT chest shows multiple pulmonary nodules.  Needs follow-up CT in 3-6 months.  Active smoker: Strongly counseled for smoking cessation.    Leucocytosis: Most likely secondary to steroids.  We will continue to monitor the WBC trend.  Patient could not be discharged today because he wants to speak to a Education officer, museum given his  current social status/other issues.  Also needs follow-up management as an outpatient.     DVT prophylaxis:Lovenox Code Status: Full Family Communication: None present on the bedside Disposition  Plan: Home tomorrow   Consultants: None  Procedures: None  Antimicrobials: Levaquin since 10/03/17                            Ceftriaxone and azithromycin from 10/01/17 to 10/02/17  Subjective: Patient seen and examined the bedside this afternoon.  Currently he is on room air saturating fine.  Still coughing.  Complains of shortness of breath on ambulation. Once respiratory social worker regarding his social status and other issues.   Objective: Vitals:   10/03/17 0528 10/03/17 0924 10/03/17 1308 10/03/17 1357  BP: (!) 159/89   (!) 160/73  Pulse: 70   (!) 102  Resp: 20   20  Temp: 98 F (36.7 C)   98.3 F (36.8 C)  TempSrc: Oral   Oral  SpO2: 97% 93% 95% 97%  Weight:      Height:        Intake/Output Summary (Last 24 hours) at 10/03/2017 1528 Last data filed at 10/03/2017 1245 Gross per 24 hour  Intake 480 ml  Output -  Net 480 ml   Filed Weights   09/30/17 2221  Weight: 108.9 kg (240 lb)    Examination:  General exam: Appears calm ,not in obvious  distress,average built Respiratory system: Bilateral decreased air entry , occasional expiratory wheezes  cardiovascular system: S1 & S2 heard, RRR. No JVD, murmurs, rubs, gallops or clicks. No pedal edema. Gastrointestinal system: Abdomen is nondistended, soft and nontender. No organomegaly or masses felt. Normal bowel sounds heard. Central nervous system: Alert and oriented. No focal neurological deficits. Extremities: No  edema, no clubbing ,no cyanosis, distal peripheral pulses palpable. Skin: No rashes, lesions or ulcers,no icterus ,no pallor Psychiatry: Judgement and insight appear normal. Mood & affect appropriate.     Data Reviewed: I have personally reviewed following labs and imaging studies  CBC: Recent Labs  Lab 09/30/17 2339 10/02/17 0544 10/03/17 0557  WBC 12.7* 18.6* 17.3*  HGB 15.7 15.1 15.0  HCT 47.3 46.2 45.9  MCV 85.4 85.6 85.5  PLT 236 238 025   Basic Metabolic Panel: Recent Labs  Lab  09/30/17 2339 10/02/17 0544 10/03/17 0557  NA 137 138 139  K 3.9 4.0 4.4  CL 108 108 106  CO2 23 24 27   GLUCOSE 144* 173* 134*  BUN 17 15 18   CREATININE 0.83 0.72 0.76  CALCIUM 8.9 8.9 8.9   GFR: Estimated Creatinine Clearance: 139.8 mL/min (by C-G formula based on SCr of 0.76 mg/dL). Liver Function Tests: No results for input(s): AST, ALT, ALKPHOS, BILITOT, PROT, ALBUMIN in the last 168 hours. No results for input(s): LIPASE, AMYLASE in the last 168 hours. No results for input(s): AMMONIA in the last 168 hours. Coagulation Profile: No results for input(s): INR, PROTIME in the last 168 hours. Cardiac Enzymes: No results for input(s): CKTOTAL, CKMB, CKMBINDEX, TROPONINI in the last 168 hours. BNP (last 3 results) No results for input(s): PROBNP in the last 8760 hours. HbA1C: No results for input(s): HGBA1C in the last 72 hours. CBG: No results for input(s): GLUCAP in the last 168 hours. Lipid Profile: No results for input(s): CHOL, HDL, LDLCALC, TRIG, CHOLHDL, LDLDIRECT in the last 72 hours. Thyroid Function Tests: No results for input(s): TSH, T4TOTAL, FREET4, T3FREE, THYROIDAB in the last 72 hours. Anemia Panel: No results for input(s): VITAMINB12, FOLATE, FERRITIN, TIBC, IRON, RETICCTPCT in the last 72 hours. Sepsis Labs: No results for input(s): PROCALCITON, LATICACIDVEN in the last 168 hours.  Recent Results (from the past 240 hour(s))  Blood culture (routine x 2)     Status: None (Preliminary result)   Collection Time: 09/30/17 11:54 PM  Result Value Ref Range Status   Specimen Description BLOOD LEFT HAND  Final   Special Requests   Final    BOTTLES DRAWN AEROBIC AND ANAEROBIC Blood Culture adequate volume   Culture   Final    NO GROWTH 2 DAYS Performed at [facility], [address], [place], [place] [address]    Report Status PENDING  Incomplete  Blood culture (routine x 2)     Status: None (Preliminary result)   Collection Time: 10/01/17 12:20 AM    Result Value Ref Range Status   Specimen Description BLOOD RIGHT HAND  Final   Special Requests   Final    BOTTLES DRAWN AEROBIC AND ANAEROBIC Blood Culture adequate volume   Culture   Final    NO GROWTH 2 DAYS Performed at [facility], [place] [address], [place],  [address]    Report Status PENDING  Incomplete         Radiology Studies: No results found.      Scheduled Meds: . ipratropium-albuterol  3 mL Nebulization Q4H  . [START ON 10/04/2017] levofloxacin  500 mg Oral Daily  . methylPREDNISolone (SOLU-MEDROL) injection  40 mg Intravenous Q12H  . nicotine  14 mg Transdermal Daily   Continuous Infusions:   LOS: 2 days    Time spent: 25 mins    Bonner Larue Jodie Echevaria, MD [facility] Pager [phone]  If 7PM-7AM, please contact night-coverage [email or web address] Password [email or web address]  10/03/2017, 3:28 PM

## 2017-10-03 NOTE — Progress Notes (Signed)
Pt will need to call for an appointment on tomorrow Weds 10/04/2017 Patient Ray Griffin. Closed today.

## 2017-10-04 DIAGNOSIS — J441 Chronic obstructive pulmonary disease with (acute) exacerbation: Principal | ICD-10-CM

## 2017-10-04 LAB — CBC WITH DIFFERENTIAL/PLATELET
Basophils Absolute: 0 10*3/uL (ref 0.0–0.1)
Basophils Relative: 0 %
Eosinophils Absolute: 0 10*3/uL (ref 0.0–0.7)
Eosinophils Relative: 0 %
HCT: 48.3 % (ref 39.0–52.0)
Hemoglobin: 15.6 g/dL (ref 13.0–17.0)
Lymphocytes Relative: 12 %
Lymphs Abs: 1.4 10*3/uL (ref 0.7–4.0)
MCH: 27.8 pg (ref 26.0–34.0)
MCHC: 32.3 g/dL (ref 30.0–36.0)
MCV: 85.9 fL (ref 78.0–100.0)
Monocytes Absolute: 0.3 10*3/uL (ref 0.1–1.0)
Monocytes Relative: 2 %
Neutro Abs: 10.3 10*3/uL — ABNORMAL HIGH (ref 1.7–7.7)
Neutrophils Relative %: 86 %
Platelets: 250 10*3/uL (ref 150–400)
RBC: 5.62 MIL/uL (ref 4.22–5.81)
RDW: 14.1 % (ref 11.5–15.5)
WBC: 12 10*3/uL — ABNORMAL HIGH (ref 4.0–10.5)

## 2017-10-04 MED ORDER — ALBUTEROL SULFATE HFA 108 (90 BASE) MCG/ACT IN AERS
2.0000 | INHALATION_SPRAY | RESPIRATORY_TRACT | Status: DC | PRN
  Administered 2017-10-04: 2 via RESPIRATORY_TRACT
  Filled 2017-10-04: qty 6.7

## 2017-10-04 MED ORDER — ALBUTEROL SULFATE (2.5 MG/3ML) 0.083% IN NEBU: 2.5000 mg | INHALATION_SOLUTION | Freq: Four times a day (QID) | RESPIRATORY_TRACT | 12 refills | Status: DC | PRN

## 2017-10-04 MED ORDER — ALBUTEROL SULFATE HFA 108 (90 BASE) MCG/ACT IN AERS: 2.0000 | INHALATION_SPRAY | RESPIRATORY_TRACT | Status: DC | PRN

## 2017-10-04 MED ORDER — NICOTINE 14 MG/24HR TD PT24: 14.0000 mg | MEDICATED_PATCH | Freq: Every day | TRANSDERMAL | 0 refills | Status: DC

## 2017-10-04 MED ORDER — CEPHALEXIN 500 MG PO CAPS: 500.0000 mg | ORAL_CAPSULE | Freq: Three times a day (TID) | ORAL | 0 refills | Status: AC

## 2017-10-04 MED ORDER — ALBUTEROL SULFATE (2.5 MG/3ML) 0.083% IN NEBU: 2.5000 mg | INHALATION_SOLUTION | RESPIRATORY_TRACT | Status: DC | PRN

## 2017-10-04 MED ORDER — PREDNISONE 20 MG PO TABS: 40.0000 mg | ORAL_TABLET | Freq: Every day | ORAL | 0 refills | Status: DC

## 2017-10-04 MED ORDER — ALBUTEROL SULFATE HFA 108 (90 BASE) MCG/ACT IN AERS: 2.0000 | INHALATION_SPRAY | RESPIRATORY_TRACT | 1 refills | Status: DC | PRN

## 2017-10-04 MED ORDER — ALBUTEROL SULFATE (2.5 MG/3ML) 0.083% IN NEBU: 2.5000 mg | INHALATION_SOLUTION | RESPIRATORY_TRACT | 12 refills | Status: DC | PRN

## 2017-10-04 MED ORDER — LISINOPRIL-HYDROCHLOROTHIAZIDE 20-12.5 MG PO TABS: 1.0000 | ORAL_TABLET | Freq: Every day | ORAL | 4 refills | Status: DC

## 2017-10-04 NOTE — Clinical Social Work Note (Signed)
Clinical Social Work Assessment  Patient Details  Name: Ray Griffin MRN: [id number] Date of Birth: October 25, 1965  Date of referral:  10/04/17               Reason for consult:  Housing Concerns/Homelessness                Permission sought to share information with:  Case Manager Permission granted to share information::  Yes, Verbal Permission Granted  Name::        Agency::     Relationship::     Contact Information:     Housing/Transportation Living arrangements for the past 2 months:  Boarding House([facility] [employer] sales) Source of Information:  Patient Patient Interpreter Needed:  None Criminal Activity/Legal Involvement Pertinent to Current Situation/Hospitalization:  No - Comment as needed Significant Relationships:  Warehouse manager Lives with:  Roommate Do you feel safe going back to the place where you live?  Yes Need for family participation in patient care:  Yes (Comment)  Care giving concerns:  No care giving concerns at the time of assessment.   Social Worker assessment / plan:  LCSW consulted for homeless.  Patient is not homeless. Patient is a resedent of [facility] sober living. Patient works for [employer].  Patient requested to speak with social worker regarding access to meds.   LCSW discussed IRC and process for applying for medicaid.   LCSW consulted RNCM for med access.  PLAN: RNCM will assist with immidiate need for meds. Patient will follow up with outside resources for med access at dc.   Employment status:  [employer] information:  Self Pay (Medicaid Pending) PT Recommendations:  Not assessed at this time Information / Referral to community resources:  Other (Comment Required)(IRC for meds)  Patient/Family's Response to care:  Patient is concerned about how he will access his meds once he is dc.   Patient/Family's Understanding of and Emotional Response to Diagnosis, Current Treatment, and Prognosis:  Patient is understanding of his  diagnosis and agreeable to treatment plan. Patient is proactive in his treatment and seeking access to meds prior to dc. Patient is aware that he needs his meds to stay well.   Emotional Assessment Appearance:  Appears older than stated age Attitude/Demeanor/Rapport:    Affect (typically observed):  Calm Orientation:  Oriented to Self, Oriented to Place, Oriented to  Time, Oriented to Situation Alcohol / Substance use:  Not Applicable Psych involvement (Current and /or in the community):  No (Comment)  Discharge Needs  Concerns to be addressed:  Medication Concerns Readmission within the last 30 days:  No Current discharge risk:  None Barriers to Discharge:  No Barriers Identified   Servando Snare, LCSW 10/04/2017, 9:49 AM

## 2017-10-04 NOTE — Care Management Note (Signed)
Case Management Note  Patient Details  Name: Ray Griffin MRN: [id number] Date of Birth: August 15, 1966  Subjective/Objective: Noted patient has already d/c, before checking on neb machine need. Per nsg Dr. Has provided patient w/script for neb machine-unsure if he will be able to get it @ a pharmacy-I have contacted [employer] rep to be aware if neb machine needed.                   Action/Plan:d/c home.   Expected Discharge Date:  10/04/17               Expected Discharge Plan:  Home/Self Care  In-House Referral:     Discharge planning Services     Post Acute Care Choice:    Choice offered to:     DME Arranged:    DME Agency:     HH Arranged:    HH Agency:     Status of Service:  Completed, signed off  If discussed at [employer] of Stay Meetings, dates discussed:    Additional Comments:  Dessa Phi, RN 10/04/2017, 12:18 PM

## 2017-10-04 NOTE — Progress Notes (Signed)
Raye Sorrow to be D/C'd Home per MD order.  Discussed prescriptions and follow up appointments with the patient. Prescriptions given to patient, medication list explained in detail. Pt verbalized understanding.  Allergies as of 10/04/2017   No Known Allergies     Medication List    STOP taking these medications   doxycycline 100 MG capsule Commonly known as:  VIBRAMYCIN     TAKE these medications   albuterol (2.5 MG/3ML) 0.083% nebulizer solution Commonly known as:  PROVENTIL Take 3 mLs (2.5 mg total) by nebulization every 6 (six) hours as needed for wheezing or shortness of breath.   albuterol (2.5 MG/3ML) 0.083% nebulizer solution Commonly known as:  PROVENTIL Take 3 mLs (2.5 mg total) by nebulization every 4 (four) hours as needed for wheezing or shortness of breath.   albuterol (2.5 MG/3ML) 0.083% nebulizer solution Commonly known as:  PROVENTIL Take 3 mLs (2.5 mg total) by nebulization every 6 (six) hours as needed for wheezing or shortness of breath.   albuterol 108 (90 Base) MCG/ACT inhaler Commonly known as:  PROVENTIL HFA;VENTOLIN HFA Inhale 2 puffs into the lungs every 4 (four) hours as needed for wheezing or shortness of breath.   cephALEXin 500 MG capsule Commonly known as:  KEFLEX Take 1 capsule (500 mg total) by mouth 3 (three) times daily for 10 days.   lisinopril-hydrochlorothiazide 20-12.5 MG tablet Commonly known as:  ZESTORETIC Take 1 tablet by mouth daily. For blood Pressure   nicotine 14 mg/24hr patch Commonly known as:  NICODERM CQ - dosed in mg/24 hours Place 1 patch (14 mg total) onto the skin daily. Start taking on:  10/05/2017   predniSONE 20 MG tablet Commonly known as:  DELTASONE Take 2 tablets (40 mg total) by mouth daily with breakfast. What changed:    medication strength  how much to take  when to take this            Durable Medical Equipment  (From admission, onward)        Start     Ordered   10/04/17 0000  DME  Nebulizer machine    Question:  Patient needs a nebulizer to treat with the following condition  Answer:  COPD (chronic obstructive pulmonary disease) ([place])   10/04/17 1030      Vitals:   10/04/17 0534 10/04/17 0756  BP: (!) 157/91   Pulse: 71   Resp: 20   Temp: 97.7 F (36.5 C)   SpO2: 97% 95%    Skin clean, dry and intact without evidence of skin break down, no evidence of skin tears noted. IV catheter discontinued intact. Site without signs and symptoms of complications. Dressing and pressure applied. Pt denies pain at this time. No complaints noted.  An After Visit Summary was printed and given to the patient. Patient escorted via [place], and D/C home via private auto.  Haywood Lasso BSN, RN [employer]  Phone [phone]

## 2017-10-04 NOTE — Discharge Instructions (Signed)
Quit smoking !!! Take medications as prescribed Follow-up at [facility] Take blood pressure medication as prescribed Low-salt diet advised Repeat CT chest in 3 to 6 months advised for Pulmonary Nodules

## 2017-10-04 NOTE — Discharge Summary (Signed)
Ray Griffin, is a 52 y.o. male  DOB 1966/07/30  MRN [id number].  Admission date:  09/30/2017  Admitting Physician  Etta Quill, DO  Discharge Date:  10/04/2017   Primary MD  Patient, No Pcp Per  Recommendations for primary care physician for things to follow:   CT chest shows multiple pulmonary nodules.  Needs follow-up CT in 3-6 months.  Admission Diagnosis  Hypoxia [R09.02] COPD exacerbation ([place]) [J44.1] Community acquired pneumonia, unspecified laterality [J18.9]   Discharge Diagnosis  Hypoxia [R09.02] COPD exacerbation ([place]) [J44.1] Community acquired pneumonia, unspecified laterality [J18.9]    Principal Problem:   COPD with acute exacerbation ([place]) Active Problems:   Pulmonary nodules/lesions, multiple   COPD exacerbation ([place])      Past Medical History:  Diagnosis Date  . Anxiety   . COPD (chronic obstructive pulmonary disease) ([place])   . Emphysema   . Hypertension   . Tachycardia     Past Surgical History:  Procedure Laterality Date  . CHOLECYSTECTOMY N/A 12/04/2014   Procedure: LAPAROSCOPIC CHOLECYSTECTOMY WITH POSSIBLE INTRAOPERATIVE CHOLANGIOGRAM;  Surgeon: Gayland Curry, MD;  Location: [facility];  Service: General;  Laterality: N/A;  . FACIAL COSMETIC SURGERY    . THORACOTOMY         HPI  from the history and physical done on the day of admission:    Chief Complaint: SOB  HPI: Ray Griffin is a 52 y.o. male with medical history significant of Emphysema, HTN, EtOH use in past but nothing to drink for past 3 years.  Patient presents to the ED with c/o increased wheezing in SOB.  Symptoms are moderate, persistent, worsened today.  No current ABx nor current prednisone use.  No MDI nor inhaler at home.  Is still smoking.   ED Course: CXR initially worrisome for PNA, but CT chest reveals that this is just scaring without focal consolidation.  Has severe emphysema on  CT chest.      Hospital Course:     Brief Narrative:  Patient is a 52 year old male with past medical history significant for COPD, hypertension who presents to the emergency department with complaints of increased wheezing, shortness of breath.  The patient does not have a PCP and he does not take any inhalers at home for his COPD.   patient is a active smoker.  Patient was admitted for the management of COPD exacerbation.  He is initial chest x-ray was worrisome for pneumonia but CT chest reveals that it was just scaring without focal consolidation.  Assessment & Plan:   Principal Problem:   COPD with acute exacerbation ([place]) Active Problems:   Pulmonary nodules/lesions, multiple   COPD exacerbation ([facility])  1)Acute COPD exacerbation: much improved overall, no hypoxia.  Discharge home on prednisone and Keflex along with bronchodilators.  Patient has no insurance so affordable drugs were preferred.  Steroid-induced leukocytosis noted, no fevers no evidence of sepsis   2)Pulmonary nodules: CT chest shows multiple pulmonary nodules.  Needs follow-up CT in 3-6 months.  Pulmonology follow-up  advised  3)Active smoker: Strongly counseled for smoking cessation.    4)HTN-elevated BP noted, lisinopril HCTZ 20/12.5 as prescribed, low-salt diet advised  Discharge Condition: stable  Follow UP  Follow-up Information    [facility]. Schedule an appointment as soon as possible for a visit.   Specialty:  Internal Medicine Why:  Call first  Contact information: [place] [phone]           Consults obtained - social work  Diet and Activity recommendation:  As advised  Discharge Instructions    Discharge Instructions    Call MD for:  difficulty breathing, headache or visual disturbances   Complete by:  As directed    Call MD for:  persistant dizziness or light-headedness   Complete by:  As directed    Call MD  for:  persistant nausea and vomiting   Complete by:  As directed    Call MD for:  redness, tenderness, or signs of infection (pain, swelling, redness, odor or green/yellow discharge around incision site)   Complete by:  As directed    Call MD for:  temperature >100.4   Complete by:  As directed    DME Nebulizer machine   Complete by:  As directed    Patient needs a nebulizer to treat with the following condition:  COPD (chronic obstructive pulmonary disease) ([place])   Diet - low sodium heart healthy   Complete by:  As directed    Discharge instructions   Complete by:  As directed    Quit smoking !!! Take medications as prescribed Follow[facility] at [facility] Take blood pressure medication as prescribed Low-salt diet advised CT chest shows multiple pulmonary nodules.  Needs follow-up/Repeat CT Chest  in 3-6 months.   Increase activity slowly   Complete by:  As directed         Discharge Medications     Allergies as of 10/04/2017   No Known Allergies     Medication List    STOP taking these medications   doxycycline 100 MG capsule Commonly known as:  VIBRAMYCIN     TAKE these medications   albuterol (2.5 MG/3ML) 0.083% nebulizer solution Commonly known as:  PROVENTIL Take 3 mLs (2.5 mg total) by nebulization every 6 (six) hours as needed for wheezing or shortness of breath.   albuterol (2.5 MG/3ML) 0.083% nebulizer solution Commonly known as:  PROVENTIL Take 3 mLs (2.5 mg total) by nebulization every 4 (four) hours as needed for wheezing or shortness of breath.   albuterol (2.5 MG/3ML) 0.083% nebulizer solution Commonly known as:  PROVENTIL Take 3 mLs (2.5 mg total) by nebulization every 6 (six) hours as needed for wheezing or shortness of breath.   cephALEXin 500 MG capsule Commonly known as:  KEFLEX Take 1 capsule (500 mg total) by mouth 3 (three) times daily for 10 days.   lisinopril-hydrochlorothiazide 20-12.5 MG tablet Commonly known as:  ZESTORETIC Take 1 tablet  by mouth daily. For blood Pressure   nicotine 14 mg/24hr patch Commonly known as:  NICODERM CQ - dosed in mg/24 hours Place 1 patch (14 mg total) onto the skin daily. Start taking on:  10/05/2017   predniSONE 20 MG tablet Commonly known as:  DELTASONE Take 2 tablets (40 mg total) by mouth daily with breakfast. What changed:    medication strength  how much to take  when to take this  Durable Medical Equipment  (From admission, onward)        Start     Ordered   10/04/17 0000  DME Nebulizer machine    Question:  Patient needs a nebulizer to treat with the following condition  Answer:  COPD (chronic obstructive pulmonary disease) ([place])   10/04/17 1030      Major procedures and Radiology Reports - PLEASE review detailed and final reports for all details, in brief -  Dg Chest 2 View  Result Date: 09/16/2017 CLINICAL DATA:  Acute onset of respiratory distress. EXAM: CHEST  2 VIEW COMPARISON:  Chest radiograph performed 08/17/2017 FINDINGS: The lungs are well-aerated. Small bilateral pleural effusions are noted. Bibasilar and bilateral central airspace opacities raise concern for pulmonary edema. Mild scarring is noted on the right, with underlying emphysema. No pneumothorax is seen. The heart is borderline normal in size. No acute osseous abnormalities are seen. IMPRESSION: 1. Small bilateral pleural effusions. Bibasilar and bilateral central airspace opacities raise concern for pulmonary edema. 2. Mild right-sided scarring noted, with underlying emphysema. Electronically Signed   By: Garald Balding M.D.   On: 09/16/2017 05:50   Ct Chest W Contrast  Result Date: 10/01/2017 CLINICAL DATA:  52 y/o  M; COPD and asthma.  Productive cough. EXAM: CT CHEST WITH CONTRAST TECHNIQUE: Multidetector CT imaging of the chest was performed during intravenous contrast administration. CONTRAST:  16mL ISOVUE-300 IOPAMIDOL (ISOVUE-300) INJECTION 61% COMPARISON:  09/30/2017 chest  radiograph. FINDINGS: Cardiovascular: No significant vascular findings. Normal heart size. No pericardial effusion. Mediastinum/Nodes: No enlarged mediastinal, hilar, or axillary lymph nodes. Thyroid gland, trachea, and esophagus demonstrate no significant findings. Lungs/Pleura: Pulmonary nodules 1. Right middle lobe, 8 mm, series 5, image 104. 2. Right middle lobe, 10 mm, series 5, image 116. 3. Right lower lobe, 6 mm, series 5, image 101. 4. Right lower lobe, 6 mm, series 5, image 106. Moderate to severe centrilobular emphysema with upper lobe predominance and left lung apex bullous changes. Left mid and lower lung zone opacities on prior radiograph correspond to multiple linear opacities in the left upper lobe and lower lobe most likely representing scarring and atelectasis. Multiple areas of scarring and atelectasis are present in the periphery of right lung as well. No discrete consolidation is identified. No pleural effusion or pneumothorax. Upper Abdomen: Cholecystectomy. Musculoskeletal: No acute osseous abnormality. Multilevel bridging syndesmophytes, probably spondyloarthropathy. IMPRESSION: 1. Left mid and lower lung zone opacities on prior radiograph corresponds to multiple linear opacities in the left upper lower lobes most compatible with scarring and atelectasis. No discrete airspace consolidation to suggest pneumonia. 2. Moderate to severe centrilobular emphysema with bullous changes of left lung apex. 3. Multiple pulmonary nodules measuring up to 10 mm in right middle lobe. Non-contrast chest CT at 3-6 months is recommended. If the nodules are stable at time of repeat CT, then future CT at 18-24 months (from today's scan) is considered optional for low-risk patients, but is recommended for high-risk patients. This recommendation follows the consensus statement: Guidelines for Management of Incidental Pulmonary Nodules Detected on CT Images: From the Fleischner Society 2017; Radiology 2017;  284:228-243. Electronically Signed   By: Kristine Garbe M.D.   On: 10/01/2017 01:49   Dg Chest Port 1 View  Result Date: 09/30/2017 CLINICAL DATA:  52 y/o M; shortness of breath and productive cough. History of COPD. Current smoker. EXAM: PORTABLE CHEST 1 VIEW COMPARISON:  09/16/2017 chest radiograph. FINDINGS: COPD. Biapical emphysema and pleuroparenchymal scarring. Surgical changes in right lung apex. Left mid  and lower lung zone consolidation. Stable mildly enlarged cardiac silhouette. No acute osseous abnormality is evident. IMPRESSION: Left mid and lower lung zone consolidation, probably pneumonia. COPD. Electronically Signed   By: Kristine Garbe M.D.   On: 09/30/2017 23:14    Micro Results   Recent Results (from the past 240 hour(s))  Blood culture (routine x 2)     Status: None (Preliminary result)   Collection Time: 09/30/17 11:54 PM  Result Value Ref Range Status   Specimen Description BLOOD LEFT HAND  Final   Special Requests   Final    BOTTLES DRAWN AEROBIC AND ANAEROBIC Blood Culture adequate volume   Culture   Final    NO GROWTH 2 DAYS Performed at [facility], [place] [address] [place], [place] [address]    Report Status PENDING  Incomplete  Blood culture (routine x 2)     Status: None (Preliminary result)   Collection Time: 10/01/17 12:20 AM  Result Value Ref Range Status   Specimen Description BLOOD RIGHT HAND  Final   Special Requests   Final    BOTTLES DRAWN AEROBIC AND ANAEROBIC Blood Culture adequate volume   Culture   Final    NO GROWTH 2 DAYS Performed at [facility], [place] [address], [place], [place] [address]    Report Status PENDING  Incomplete       Today   Subjective    Tanay Misuraca today has no new complaints, ambulating without significant dyspnea on exertion, no hypoxia          Patient has been seen and examined prior to discharge   Objective   Blood pressure (!) 157/91, pulse 71, temperature 97.7 F (36.5  C), temperature source Oral, resp. rate 20, height 6\' 1"  (1.854 m), weight 108.9 kg (240 lb), SpO2 95 %.   Intake/Output Summary (Last 24 hours) at 10/04/2017 1031 Last data filed at 10/04/2017 0900 Gross per 24 hour  Intake 720 ml  Output -  Net 720 ml    Exam Gen:- Awake  In no apparent distress  HEENT:- [place].AT,   Neck-Supple Neck,No JVD,  Lungs- mostly clear  CV- S1, S2 normal Abd-  +ve B.Sounds, Abd Soft, No tenderness,    Extremity/Skin:- Intact peripheral pulses   Psych-affect is appropriate, patient is actually in good spirits Neuro-no new focal deficits   Data Review   CBC w Diff:  Lab Results  Component Value Date   WBC 12.0 (H) 10/04/2017   HGB 15.6 10/04/2017   HCT 48.3 10/04/2017   PLT 250 10/04/2017   LYMPHOPCT 12 10/04/2017   MONOPCT 2 10/04/2017   EOSPCT 0 10/04/2017   BASOPCT 0 10/04/2017    CMP:  Lab Results  Component Value Date   NA 139 10/03/2017   K 4.4 10/03/2017   CL 106 10/03/2017   CO2 27 10/03/2017   BUN 18 10/03/2017   CREATININE 0.76 10/03/2017   PROT 6.4 12/04/2014   ALBUMIN 3.8 12/04/2014   BILITOT 1.2 12/04/2014   ALKPHOS 102 12/04/2014   AST 21 12/04/2014   ALT 34 12/04/2014  .   Total Discharge time is about 33 minutes  Roxan Hockey M.D on 10/04/2017 at 10:31 AM  [facility]   Office  [phone]  Voice Recognition Viviann Spare dictation system was used to create this note, attempts have been made to correct errors. Please contact the author with questions and/or clarifications.

## 2017-10-06 LAB — CULTURE, BLOOD (ROUTINE X 2)
Culture: NO GROWTH
Culture: NO GROWTH
Special Requests: ADEQUATE
Special Requests: ADEQUATE

## 2017-10-17 ENCOUNTER — Encounter: Payer: Self-pay | Admitting: Internal Medicine

## 2017-10-17 ENCOUNTER — Ambulatory Visit (INDEPENDENT_AMBULATORY_CARE_PROVIDER_SITE_OTHER): Payer: Self-pay | Admitting: Internal Medicine

## 2017-10-17 VITALS — BP 144/84 | HR 75 | Ht 73.0 in | Wt 258.0 lb

## 2017-10-17 DIAGNOSIS — R918 Other nonspecific abnormal finding of lung field: Secondary | ICD-10-CM

## 2017-10-17 DIAGNOSIS — J449 Chronic obstructive pulmonary disease, unspecified: Secondary | ICD-10-CM

## 2017-10-17 DIAGNOSIS — F17210 Nicotine dependence, cigarettes, uncomplicated: Secondary | ICD-10-CM

## 2017-10-17 MED ORDER — ALBUTEROL SULFATE HFA 108 (90 BASE) MCG/ACT IN AERS
2.0000 | INHALATION_SPRAY | RESPIRATORY_TRACT | 11 refills | Status: DC | PRN
Start: 2017-10-17 — End: 2019-09-13

## 2017-10-17 MED ORDER — BUDESONIDE-FORMOTEROL FUMARATE 160-4.5 MCG/ACT IN AERO: 2.0000 | INHALATION_SPRAY | Freq: Two times a day (BID) | RESPIRATORY_TRACT | 0 refills | Status: DC

## 2017-10-17 NOTE — Progress Notes (Signed)
Subjective:     Patient ID: Ray Griffin, male   DOB: July 24, 1966,     MRN: [id number]  HPI  52 yowm active smoker s/p R thoracotomy for Bleb 2004  With worse doe x summer 2017  Referred to pulmonary clinic 10/17/2017 p admit:   Admission date:  09/30/2017   Discharge Date:  10/04/2017    Admission Diagnosis  Hypoxia [R09.02] COPD exacerbation ([place]) [J44.1] Community acquired pneumonia, unspecified laterality [J18.9]   Discharge Diagnosis  Hypoxia [R09.02] COPD exacerbation ([place]) [J44.1] Community acquired pneumonia, unspecified laterality [J18.9]    Principal Problem:   COPD with acute exacerbation ([place]) Active Problems:   Pulmonary nodules/lesions, multiple   COPD exacerbation ([place])          Past Medical History:  Diagnosis Date  . Anxiety   . COPD (chronic obstructive pulmonary disease) ([place])   . Emphysema   . Hypertension   . Tachycardia          Past Surgical History:  Procedure Laterality Date  . CHOLECYSTECTOMY N/A 12/04/2014   Procedure: LAPAROSCOPIC CHOLECYSTECTOMY WITH POSSIBLE INTRAOPERATIVE CHOLANGIOGRAM;  Surgeon: Gayland Curry, MD;  Location: [facility];  Service: General;  Laterality: N/A;  . FACIAL COSMETIC SURGERY    . THORACOTOMY         HPI  from the history and physical done on the day of admission:    Chief Complaint:SOB  HGD:JMEQAST Ray Royceis a 52 Griffin medical history significant ofEmphysema, HTN, EtOH use in past but nothing to drink for past 3 years. Patient presents to the ED with c/o increased wheezing in SOB. Symptoms are moderate, persistent, worsened today. No current ABx nor current prednisone use. No MDI nor inhaler at home. Is still smoking.   ED Course:CXR initially worrisome for PNA, but CT chest reveals that this is just scaring without focal consolidation. Has severe emphysema on CT chest.      Hospital Course:     Brief Narrative:  Patient is a 52 year old male with past  medical history significant for COPD, hypertension who presents to the emergency department with complaints of increased wheezing, shortness of breath. The patient does not have a PCP and he does not take any inhalers at home for his COPD. patient is a active smoker. Patient was admitted for the management of COPD exacerbation. He is initial chest x-ray was worrisome for pneumonia but CT chest reveals that it was just scaring without focal consolidation.  Assessment & Plan:  Principal Problem: COPD with acute exacerbation ([place]) Active Problems: Pulmonary nodules/lesions, multiple COPD exacerbation ([facility])  1)Acute COPD exacerbation: much improved overall, no hypoxia.  Discharge home on prednisone and Keflex along with bronchodilators.  Patient has no insurance so affordable drugs were preferred.  Steroid-induced leukocytosis noted, no fevers no evidence of sepsis  2)Pulmonary nodules: CT chest shows multiple pulmonary nodules. Needs follow-up CT in 3-6 months.  Pulmonology follow-up advised  3)Active smoker: Strongly counseled for smoking cessation.  4)HTN-elevated BP noted, lisinopril HCTZ 20/12.5 as prescribed, low-salt diet advised    10/17/2017 1st  Pulmonary office visit/ Haylyn Halberg   Chief Complaint  Patient presents with  . Pulmonary Consult    Referred by Hospital for f/u on MPN. He was admitted to [facility] with PNA 10/31/16-10/04/17. Breathing is now back to baseline but he still has occ cough with white sputum.  He uses his albuterol inhaler once per wk on average and neb about 3 x per wk.   back to baseline = MMRC3 = can't  walk 100 yards even at a slow pace at a flat grade s stopping due to sob  Can do HT but not the mall x 1.5 y at wt 30 lb up from best wt = 230 Able to lie flat ok / some white phlegm in am only o/w min cough despite ACEi new rx    No obvious day to day or daytime variability or assoc excess/ purulent sputum or mucus plugs or hemoptysis or cp or  chest tightness, subjective wheeze or overt sinus or hb symptoms. No unusual exposure hx or h/o childhood pna/ asthma or knowledge of premature birth.  Sleeping ok flat without nocturnal  or early am exacerbation  of respiratory  c/o's or need for noct saba. Also denies any obvious fluctuation of symptoms with weather or environmental changes or other aggravating or alleviating factors except as outlined above   Current Allergies, Complete Past Medical History, Past Surgical History, Family History, and Social History were reviewed in [employer] record.  ROS  The following are not active complaints unless bolded Hoarseness, sore throat, dysphagia, dental problems, itching, sneezing,  nasal congestion or discharge of excess mucus or purulent secretions, ear ache,   fever, chills, sweats, unintended wt loss or wt gain, classically pleuritic or exertional cp,  orthopnea pnd or leg swelling, presyncope, palpitations, abdominal pain, anorexia, nausea, vomiting, diarrhea  or change in bowel habits or change in bladder habits, change in stools or change in urine, dysuria, hematuria,  rash, arthralgias, visual complaints, headache, numbness, weakness or ataxia or problems with walking or coordination,  change in mood/affect or memory.        Current Meds  Medication Sig  . albuterol (PROVENTIL HFA;VENTOLIN HFA) 108 (90 Base) MCG/ACT inhaler Inhale 2 puffs into the lungs every 4 (four) hours as needed for wheezing or shortness of breath.  Marland Kitchen albuterol (PROVENTIL) (2.5 MG/3ML) 0.083% nebulizer solution Take 3 mLs (2.5 mg total) by nebulization every 6 (six) hours as needed for wheezing or shortness of breath.  . lisinopril-hydrochlorothiazide (ZESTORETIC) 20-12.5 MG tablet Take 1 tablet by mouth daily. For blood Pressure             Review of Systems     Objective:   Physical Exam    amb wm nad  15 h since last neb   Wt Readings from Last 3 Encounters:  10/17/17 258 lb  (117 kg)  09/30/17 240 lb (108.9 kg)  11/28/16 238 lb (108 kg)     Vital signs reviewed - Note on arrival 02 sats  100% on RA       HEENT: nl dentition, turbinates bilaterally, and oropharynx. Nl external ear canals without cough reflex   NECK :  without JVD/Nodes/TM/ nl carotid upstrokes bilaterally   LUNGS: no acc muscle use,  Nl contour chest with distant bs s wheeze    CV:  RRR  no s3 or murmur or increase in P2, and no edema   ABD:  Obese / soft and nontender with pos hoover's sign in mid insp  in the supine position. No bruits or organomegaly appreciated, bowel sounds nl  MS:  Nl gait/ ext warm without deformities, calf tenderness, cyanosis or clubbing No obvious joint restrictions   SKIN: warm and dry without lesions    NEURO:  alert, approp, nl sensorium with  no motor or cerebellar deficits apparent.     I personally reviewed images and agree with radiology impression as follows:   Chest CT 10/01/17  1. Left mid and lower lung zone opacities on prior radiograph corresponds to multiple linear opacities in the left upper lower lobes most compatible with scarring and atelectasis. No discrete airspace consolidation to suggest pneumonia. 2. Moderate to severe centrilobular emphysema with bullous changes of left lung apex. 3. Multiple pulmonary nodules measuring up to 10 mm in right middle lobe     Assessment:

## 2017-10-17 NOTE — Assessment & Plan Note (Addendum)
Although there are clearly abnormalities on CT scan, they should probably be considered "microscopic" since not obvious on plain cxr .     In the setting of obvious "macroscopic" health issues,  I am very reluctatnt to embark on an invasive w/u at this point but will arrange consevative  follow up and in the meantime see what we can do to address the patient's subjective concerns.    Will do f/u cxr at 3 m and ct at 57 m since mpns don't represent any kind of early opportunity for intervention  Discussed in detail all the  indications, usual  risks and alternatives  relative to the benefits with patient who agrees to proceed with conservative f/u as outlined

## 2017-10-17 NOTE — Assessment & Plan Note (Addendum)
Spirometry 10/17/2017  FEV1 2.0 (47%)  Ratio 67   - 10/17/2017  After extensive coaching inhaler device  effectiveness =    90% > try symb 160 2 bid sample only (can't afford rx but may not need it if quits smoking)  DDX of  difficult airways management almost all start with A and  include Adherence, Ace Inhibitors, Acid Reflux, Active Sinus Disease, Alpha 1 Antitripsin deficiency, Anxiety masquerading as Airways dz,  ABPA,  Allergy(esp in young), Aspiration (esp in elderly), Adverse effects of meds,  Active smokers, A bunch of PE's (a small clot burden can't cause this syndrome unless there is already severe underlying pulm or vascular dz with poor reserve) plus two Bs  = Bronchiectasis and Beta blocker use..and one C= CHF  Adherence is always the initial "prime suspect" and is a multilayered concern that requires a "trust but verify" approach in every patient - starting with knowing how to use medications, especially inhalers, correctly, keeping up with refills and understanding the fundamental difference between maintenance and prns vs those medications only taken for a very short course and then stopped and not refilled.  - see hfa teaching - return with all meds in hand using a trust but verify approach to confirm accurate Medication  Reconciliation The principal here is that until we are certain that the  patients are doing what we've asked, it makes no sense to ask them to do more.    Active smoking  (see separate a/p)   ? Acei effects > just started it so ok to continue for now but keep in mind symptoms overlap with copd    ? Anxiety/depression/ deconditioning  > usually at the bottom of this list of usual suspects but should be much higher on this pt's    ? Chf/ cardiac asthma > note bnp nl during flare 09/2017 rules out   Total time devoted to counseling  > 50 % of initial 60 min office visit:  review case with pt/ discussion of options/alternatives/ personally creating written customized  instructions  in presence of pt  then going over those specific  Instructions directly with the pt including how to use all of the meds but in particular covering each new medication in detail and the difference between the maintenance= "automatic" meds and the prns using an action plan format for the latter (If this problem/symptom => do that organization reading Left to right).  Please see AVS from this visit for a full list of these instructions which I personally wrote for this pt and  are unique to this visit.

## 2017-10-17 NOTE — Assessment & Plan Note (Signed)

## 2017-10-17 NOTE — Patient Instructions (Signed)
Plan A  =  Breathe clean air/  Use up the symbicort 160 2 puffs every 12 hours and call for prescription if you feel it really helps you breathe  Plan B = Backup Only use your albuterol (Proair)as a rescue medication to be used if you can't catch your breath by resting or doing a relaxed purse lip breathing pattern.  - The less you use it, the better it will work when you need it. - Ok to use the inhaler up to 2 puffs  every 4 hours if you must but call for appointment if use goes up over your usual need - Don't leave home without it !!  (think of it like the spare tire for your car)   Plan C = Crisis - only use your albuterol nebulizer if you first try Plan B and it fails to help > ok to use the nebulizer up to every 4 hours but if start needing it regularly call for immediate appointment   The key is to stop smoking completely before smoking completely stops you - it's not too late   Please schedule a follow up visit in 3 months but call sooner if needed  with all medications /inhalers/ solutions in hand so we can verify exactly what you are taking. This includes all medications from all doctors and over the counters  -  CXR on return

## 2018-01-15 ENCOUNTER — Ambulatory Visit: Payer: Self-pay | Admitting: Internal Medicine

## 2019-09-13 ENCOUNTER — Other Ambulatory Visit: Payer: Self-pay

## 2019-09-13 ENCOUNTER — Encounter: Payer: Self-pay | Admitting: Nurse Practitioner

## 2019-09-13 ENCOUNTER — Ambulatory Visit: Payer: Self-pay | Attending: Nurse Practitioner | Admitting: Nurse Practitioner

## 2019-09-13 DIAGNOSIS — J449 Chronic obstructive pulmonary disease, unspecified: Secondary | ICD-10-CM

## 2019-09-13 DIAGNOSIS — Z7689 Persons encountering health services in other specified circumstances: Secondary | ICD-10-CM

## 2019-09-13 DIAGNOSIS — I10 Essential (primary) hypertension: Secondary | ICD-10-CM

## 2019-09-13 NOTE — Progress Notes (Signed)
Virtual Visit via Telephone Note Due to national recommendations of social distancing due to [place] 19, telehealth visit is felt to be most appropriate for this patient at this time.  I discussed the limitations, risks, security and privacy concerns of performing an evaluation and management service by telephone and the availability of in person appointments. I also discussed with the patient that there may be a patient responsible charge related to this service. The patient expressed understanding and agreed to proceed.    I connected with Ray Griffin on 09/14/19  at   1:50 PM EST  EDT by telephone and verified that I am speaking with the correct person using two identifiers.   Consent I discussed the limitations, risks, security and privacy concerns of performing an evaluation and management service by telephone and the availability of in person appointments. I also discussed with the patient that there may be a patient responsible charge related to this service. The patient expressed understanding and agreed to proceed.   Location of Patient: Private  Residence   Location of Provider: [place] and [employer] Office    Persons participating in Telemedicine visit: Ray Rankins FNP-BC [place]    History of Present Illness: Telemedicine visit for: Establish Care  has a past medical history of Anxiety, COPD (chronic obstructive pulmonary disease) ([place]), Emphysema, Hypertension, and Tachycardia.  Essential Hypertension Has been out of his medications "for quite some time". States he was also prescribed metoprolol in the past due to "tachycardia". I will wait to get an EKG in the office prior to prescribing this. I will refill the lisinopril-hctz 20-12.5mg  as 20-25mg   today. Denies chest pain, palpitations, lightheadedness, dizziness, headaches. Endorses BLE edema however he does eat a lot of processed foods. Does not wear compression socks. Tries to drink  water daily. Not sure of the amount.  BP Readings from Last 3 Encounters:  10/17/17 (!) 144/84  10/04/17 (!) 157/91  09/16/17 131/82    COPD Has not seen Dr. Melvyn Novas Pulmonologist since last year 10-2017. Right thoracotomy for Bleb 2004. Requesting a nebulizer machine as his is no longer working. Continues to smoke.  He has tried nicotine patches in the past and states it made him want to smoke even more. Will try increased strength in patch and fill today. CT in 2019 showed pulmonary nodules. He was instructed to return for f/u CT 3-6 months. He gets his Symbicort inhalers from a neighbor who can get samples for him as often as needed.     Past Medical History:  Diagnosis Date  . Anxiety   . COPD (chronic obstructive pulmonary disease) ([place])   . Emphysema   . Hypertension   . Tachycardia     Past Surgical History:  Procedure Laterality Date  . CHOLECYSTECTOMY N/A 12/04/2014   Procedure: LAPAROSCOPIC CHOLECYSTECTOMY WITH POSSIBLE INTRAOPERATIVE CHOLANGIOGRAM;  Surgeon: Gayland Curry, MD;  Location: [facility];  Service: General;  Laterality: N/A;  . FACIAL COSMETIC SURGERY    . THORACOTOMY      Family History  Problem Relation Age of Onset  . Diabetes Mother   . Heart failure Mother   . Diabetes Father   . Heart failure Father   . Emphysema Father        smoked  . Bipolar disorder Brother   . Thyroid cancer Maternal Grandmother     Social History   Socioeconomic History  . Marital status: Single    Spouse name: Not on file  .  Number of children: Not on file  . Years of education: Not on file  . Highest education level: Not on file  Occupational History  . Not on file  Tobacco Use  . Smoking status: Current Every Day Smoker    Packs/day: 1.50    Years: 30.00    Pack years: 45.00  . Smokeless tobacco: Never Used  Substance and Sexual Activity  . Alcohol use: No    Comment: recently quit 3 mths ago  . Drug use: No  . Sexual activity: Not on file  Other Topics Concern  .  Not on file  Social History Narrative  . Not on file   Social Determinants of Health   Financial Resource Strain:   . Difficulty of Paying Living Expenses: Not on file  Food Insecurity:   . Worried About Charity fundraiser in the Last Year: Not on file  . Ran Out of Food in the Last Year: Not on file  Transportation Needs:   . Lack of Transportation (Medical): Not on file  . Lack of Transportation (Non-Medical): Not on file  Physical Activity:   . Days of Exercise per Week: Not on file  . Minutes of Exercise per Session: Not on file  Stress:   . Feeling of Stress : Not on file  Social Connections:   . Frequency of Communication with Friends and Family: Not on file  . Frequency of Social Gatherings with Friends and Family: Not on file  . Attends Religious Services: Not on file  . Active Member of Clubs or Organizations: Not on file  . Attends Archivist Meetings: Not on file  . Marital Status: Not on file     Observations/Objective: Awake, alert and oriented x 3   Review of Systems  Constitutional: Negative for fever, malaise/fatigue and weight loss.  HENT: Negative.  Negative for nosebleeds.   Eyes: Negative.  Negative for blurred vision, double vision and photophobia.  Respiratory: Negative.  Negative for cough and shortness of breath.   Cardiovascular: Negative.  Negative for chest pain, palpitations and leg swelling.  Gastrointestinal: Negative.  Negative for heartburn, nausea and vomiting.  Musculoskeletal: Negative.  Negative for myalgias.  Neurological: Negative.  Negative for dizziness, focal weakness, seizures and headaches.  Psychiatric/Behavioral: Negative.  Negative for suicidal ideas.    Assessment and Plan: [name] was seen today for establish care, hypertension and medication refill.  Diagnoses and all orders for this visit:  Encounter to establish care  COPD mixed type ([place]) -     Respiratory Therapy Supplies (NEBULIZER) DEVI; Please  provide patient with nebulizer for personal home use. -     albuterol (VENTOLIN HFA) 108 (90 Base) MCG/ACT inhaler; Inhale 2 puffs into the lungs every 4 (four) hours as needed for wheezing or shortness of breath.  Essential hypertension -     lisinopril-hydrochlorothiazide (ZESTORETIC) 20-25 MG tablet; Take 1 tablet by mouth daily. Continue all antihypertensives as prescribed.  Remember to bring in your blood pressure log with you for your follow up appointment.  DASH/Mediterranean Diets are healthier choices for HTN.     Follow Up Instructions Return in about 8 weeks (around 11/08/2019).     I discussed the assessment and treatment plan with the patient. The patient was provided an opportunity to ask questions and all were answered. The patient agreed with the plan and demonstrated an understanding of the instructions.   The patient was advised to call back or seek an in-person evaluation if the  symptoms worsen or if the condition fails to improve as anticipated.  I provided 19 minutes of non-face-to-face time during this encounter including median intraservice time, reviewing previous notes, labs, imaging, medications and explaining diagnosis and management.  Gildardo Pounds, FNP-BC

## 2019-09-14 ENCOUNTER — Encounter: Payer: Self-pay | Admitting: Nurse Practitioner

## 2019-09-14 MED ORDER — NEBULIZER DEVI: 0 refills | Status: DC

## 2019-09-14 MED ORDER — LISINOPRIL-HYDROCHLOROTHIAZIDE 20-25 MG PO TABS: 1.0000 | ORAL_TABLET | Freq: Every day | ORAL | 3 refills | Status: DC

## 2019-09-14 MED ORDER — ALBUTEROL SULFATE HFA 108 (90 BASE) MCG/ACT IN AERS: 2.0000 | INHALATION_SPRAY | RESPIRATORY_TRACT | 1 refills | Status: DC | PRN

## 2019-09-19 ENCOUNTER — Telehealth: Payer: Self-pay | Admitting: Nurse Practitioner

## 2019-09-19 NOTE — Telephone Encounter (Signed)
Patient called asking for neb to be sent to [facility] on [address]

## 2019-09-23 NOTE — Telephone Encounter (Signed)
Pt. Was informed to stop by our clinic to get a Neb. Machine from [place]. Pt. Understood.

## 2019-09-30 ENCOUNTER — Ambulatory Visit: Payer: Self-pay | Admitting: Pharmacist

## 2019-11-14 ENCOUNTER — Ambulatory Visit: Payer: Self-pay | Admitting: Nurse Practitioner

## 2019-11-15 ENCOUNTER — Ambulatory Visit: Payer: Self-pay | Admitting: Nurse Practitioner

## 2019-11-18 ENCOUNTER — Ambulatory Visit: Payer: Self-pay | Admitting: Nurse Practitioner

## 2020-10-08 ENCOUNTER — Emergency Department (HOSPITAL_COMMUNITY)
Admission: EM | Admit: 2020-10-08 | Discharge: 2020-10-08 | Disposition: A | Discharge status: Home / Self Care | Payer: Self-pay | Attending: Emergency Medicine | Admitting: Emergency Medicine

## 2020-10-08 ENCOUNTER — Encounter (HOSPITAL_COMMUNITY): Payer: Self-pay

## 2020-10-08 ENCOUNTER — Emergency Department (HOSPITAL_COMMUNITY): Payer: Self-pay

## 2020-10-08 ENCOUNTER — Ambulatory Visit: Payer: Self-pay | Admitting: *Deleted

## 2020-10-08 ENCOUNTER — Other Ambulatory Visit: Payer: Self-pay

## 2020-10-08 DIAGNOSIS — F17210 Nicotine dependence, cigarettes, uncomplicated: Secondary | ICD-10-CM | POA: Insufficient documentation

## 2020-10-08 DIAGNOSIS — J181 Lobar pneumonia, unspecified organism: Secondary | ICD-10-CM | POA: Insufficient documentation

## 2020-10-08 DIAGNOSIS — R030 Elevated blood-pressure reading, without diagnosis of hypertension: Secondary | ICD-10-CM

## 2020-10-08 DIAGNOSIS — R531 Weakness: Secondary | ICD-10-CM

## 2020-10-08 DIAGNOSIS — I10 Essential (primary) hypertension: Secondary | ICD-10-CM | POA: Insufficient documentation

## 2020-10-08 DIAGNOSIS — J189 Pneumonia, unspecified organism: Secondary | ICD-10-CM

## 2020-10-08 DIAGNOSIS — Z79899 Other long term (current) drug therapy: Secondary | ICD-10-CM | POA: Insufficient documentation

## 2020-10-08 DIAGNOSIS — J449 Chronic obstructive pulmonary disease, unspecified: Secondary | ICD-10-CM | POA: Insufficient documentation

## 2020-10-08 DIAGNOSIS — Z20822 Contact with and (suspected) exposure to covid-19: Secondary | ICD-10-CM | POA: Insufficient documentation

## 2020-10-08 LAB — URINALYSIS, ROUTINE W REFLEX MICROSCOPIC
Bilirubin Urine: NEGATIVE
Glucose, UA: NEGATIVE mg/dL
Hgb urine dipstick: NEGATIVE
Ketones, ur: NEGATIVE mg/dL
Leukocytes,Ua: NEGATIVE
Nitrite: NEGATIVE
Protein, ur: NEGATIVE mg/dL
Specific Gravity, Urine: 1.008 (ref 1.005–1.030)
pH: 6 (ref 5.0–8.0)

## 2020-10-08 LAB — CBC WITH DIFFERENTIAL/PLATELET
Abs Immature Granulocytes: 0.05 10*3/uL (ref 0.00–0.07)
Basophils Absolute: 0.1 10*3/uL (ref 0.0–0.1)
Basophils Relative: 0 %
Eosinophils Absolute: 0.1 10*3/uL (ref 0.0–0.5)
Eosinophils Relative: 1 %
HCT: 53.4 % — ABNORMAL HIGH (ref 39.0–52.0)
Hemoglobin: 17.7 g/dL — ABNORMAL HIGH (ref 13.0–17.0)
Immature Granulocytes: 0 %
Lymphocytes Relative: 18 %
Lymphs Abs: 2.5 10*3/uL (ref 0.7–4.0)
MCH: 28.3 pg (ref 26.0–34.0)
MCHC: 33.1 g/dL (ref 30.0–36.0)
MCV: 85.4 fL (ref 80.0–100.0)
Monocytes Absolute: 0.7 10*3/uL (ref 0.1–1.0)
Monocytes Relative: 5 %
Neutro Abs: 10.8 10*3/uL — ABNORMAL HIGH (ref 1.7–7.7)
Neutrophils Relative %: 76 %
Platelets: 269 10*3/uL (ref 150–400)
RBC: 6.25 MIL/uL — ABNORMAL HIGH (ref 4.22–5.81)
RDW: 12.9 % (ref 11.5–15.5)
WBC: 14.2 10*3/uL — ABNORMAL HIGH (ref 4.0–10.5)
nRBC: 0 % (ref 0.0–0.2)

## 2020-10-08 LAB — COMPREHENSIVE METABOLIC PANEL
ALT: 36 U/L (ref 0–44)
AST: 24 U/L (ref 15–41)
Albumin: 4.5 g/dL (ref 3.5–5.0)
Alkaline Phosphatase: 84 U/L (ref 38–126)
Anion gap: 10 (ref 5–15)
BUN: 10 mg/dL (ref 6–20)
CO2: 26 mmol/L (ref 22–32)
Calcium: 9.3 mg/dL (ref 8.9–10.3)
Chloride: 102 mmol/L (ref 98–111)
Creatinine, Ser: 0.99 mg/dL (ref 0.61–1.24)
GFR, Estimated: >60 mL/min (ref >60)
Glucose, Bld: 94 mg/dL (ref 70–99)
Potassium: 4 mmol/L (ref 3.5–5.1)
Sodium: 138 mmol/L (ref 135–145)
Total Bilirubin: 0.8 mg/dL (ref 0.3–1.2)
Total Protein: 7.6 g/dL (ref 6.5–8.1)

## 2020-10-08 LAB — BRAIN NATRIURETIC PEPTIDE: B Natriuretic Peptide: 148.5 pg/mL — ABNORMAL HIGH (ref 0.0–100.0)

## 2020-10-08 LAB — TROPONIN I (HIGH SENSITIVITY): Troponin I (High Sensitivity): 11 ng/L (ref <18)

## 2020-10-08 MED ORDER — HYDROCHLOROTHIAZIDE 12.5 MG PO CAPS
25.0000 mg | ORAL_CAPSULE | Freq: Once | ORAL | Status: AC
  Administered 2020-10-08: 25 mg via ORAL
  Filled 2020-10-08: qty 2

## 2020-10-08 MED ORDER — DOXYCYCLINE HYCLATE 100 MG PO CAPS: 100.0000 mg | ORAL_CAPSULE | Freq: Two times a day (BID) | ORAL | 0 refills | Status: DC

## 2020-10-08 MED ORDER — HYDROCHLOROTHIAZIDE 25 MG PO TABS: 25.0000 mg | ORAL_TABLET | Freq: Every day | ORAL | 0 refills | Status: DC

## 2020-10-08 NOTE — Discharge Instructions (Addendum)
As discussed, all of your labs were reassuring today.  Your chest x-ray showed possible pneumonia.  I am sending you home with an antibiotic.  Take as prescribed and finish all antibiotics.  I am also sending you home with a blood pressure medication.  Take daily.  Do not take your previous BP medication. I have placed a referral for social work to call you to set up an appointment with a PCP.  Please follow-up with PCP for further evaluation of blood pressure.  Return to the ER for new or worsening symptoms.

## 2020-10-08 NOTE — ED Triage Notes (Addendum)
Pt coming from home c/o weakness x a few days and worse today. Evaluated by ems and bp was 220/118. C/o "funny feeling in head." no change in breathing. Does not take bp meds like he is suppose to. Talking in complete sentences in triage. No facial droop. Unvaccinated

## 2020-10-08 NOTE — ED Provider Notes (Signed)
[facility]-EMERGENCY DEPT Provider Note   CSN: [id number] Arrival date & time: 10/08/20  1311     History Chief Complaint  Patient presents with  . Weakness  . Hypertension    Ray Griffin is a 55 y.o. male with a past medical history significant for anxiety, COPD, hypertension who presents to the ED due to generalized weakness x 6 days. Patient states he hasn't been feeling himself over the past 6 days. He thought he had a cold due to fatigue and generalized weakness which worsened today causing him to report to the ED. Upon EMS arrival, patient found to be hypertensive at 220/118. He admits to a "weird feeling" in the frontal aspect of his head, but denies a headache. Admits to baseline poor vision with no change; however notes his left eye has been "more watery" over the past few days. Denies fever and chills.  Denies unilateral weakness, changes to speech, dizziness.  Patient states he is supposed to take lisinopril however, has not taken it in a few months because he does not like the way it makes him feel. Denies chest pain and shortness of breath. He also admits to intermittent LLQ abdominal pain, but denies associated nausea and vomiting.  He admits to a few episodes of nonbloody diarrhea this morning.  Denies sick contacts known Covid exposures.  Patient is unvaccinated  History obtained from patient and past medical records. No interpreter used during encounter.      Past Medical History:  Diagnosis Date  . Anxiety   . COPD (chronic obstructive pulmonary disease) (HCC)   . Emphysema   . Hypertension   . Tachycardia     Patient Active Problem List   Diagnosis Date Noted  . Pulmonary nodules/lesions, multiple 10/01/2017  . COPD exacerbation (HCC) 10/01/2017  . Acute cholecystitis 12/04/2014  . COPD with acute exacerbation (HCC) 12/04/2014  . Gallstones 12/03/2014  . SINUSITIS - ACUTE-NOS 10/17/2009  . Cigarette smoker 01/08/2008  . PANIC ATTACK,  ACUTE 06/18/2007  . ALLERGIC RHINITIS 06/18/2007  . COPD GOLD III/ still smoking 06/18/2007  . DEPRESSION 04/04/2007  . Essential hypertension 04/04/2007    Past Surgical History:  Procedure Laterality Date  . CHOLECYSTECTOMY N/A 12/04/2014   Procedure: LAPAROSCOPIC CHOLECYSTECTOMY WITH POSSIBLE INTRAOPERATIVE CHOLANGIOGRAM;  Surgeon: Atilano Ina, MD;  Location: [facility] ORS;  Service: General;  Laterality: N/A;  . FACIAL COSMETIC SURGERY    . THORACOTOMY         Family History  Problem Relation Age of Onset  . Diabetes Mother   . Heart failure Mother   . Diabetes Father   . Heart failure Father   . Emphysema Father        smoked  . Bipolar disorder Brother   . Thyroid cancer Maternal Grandmother     Social History   Tobacco Use  . Smoking status: Current Every Day Smoker    Packs/day: 1.50    Years: 30.00    Pack years: 45.00  . Smokeless tobacco: Never Used  Substance Use Topics  . Alcohol use: No    Comment: recently quit 3 mths ago  . Drug use: No    Home Medications Prior to Admission medications   Medication Sig Start Date End Date Taking? Authorizing Provider  doxycycline (VIBRAMYCIN) 100 MG capsule Take 1 capsule (100 mg total) by mouth 2 (two) times daily. 10/08/20  Yes Adalene Gulotta, Merla Riches, PA-C  hydrochlorothiazide (HYDRODIURIL) 25 MG tablet Take 1 tablet (25 mg total) by  mouth daily. 10/08/20  Yes Aadhya Bustamante, Merla Richesaroline C, PA-C  lisinopril-hydrochlorothiazide (ZESTORETIC) 20-25 MG tablet Take 1 tablet by mouth daily. 09/14/19  Yes Claiborne RiggFleming, Zelda W, NP  albuterol (PROVENTIL) (2.5 MG/3ML) 0.083% nebulizer solution Take 3 mLs (2.5 mg total) by nebulization every 6 (six) hours as needed for wheezing or shortness of breath. Patient not taking: No sig reported 10/04/17   Shon HaleEmokpae, Courage, MD  albuterol (VENTOLIN HFA) 108 (90 Base) MCG/ACT inhaler Inhale 2 puffs into the lungs every 4 (four) hours as needed for wheezing or shortness of breath. Patient not taking: No sig  reported 09/14/19   Claiborne RiggFleming, Zelda W, NP  budesonide-formoterol [facility](SYMBICORT) 160-4.5 MCG/ACT inhaler Inhale 2 puffs into the lungs 2 (two) times daily. Patient not taking: No sig reported 10/17/17   Nyoka CowdenWert, Milledge B, MD  Respiratory Therapy Supplies (NEBULIZER) DEVI Please provide patient with nebulizer for personal home use. Patient not taking: Reported on 10/08/2020 09/14/19   Claiborne RiggFleming, Zelda W, NP    Allergies    Patient has no known allergies.  Review of Systems   Review of Systems  Constitutional: Negative for chills and fever.  Eyes: Positive for visual disturbance (baseline decreased vision).  Respiratory: Negative for cough and shortness of breath.   Cardiovascular: Negative for chest pain.  Gastrointestinal: Positive for abdominal pain and diarrhea. Negative for nausea and vomiting.  Neurological: Positive for weakness (generalized) and headaches (weird sensation in head). Negative for dizziness, facial asymmetry and speech difficulty.  All other systems reviewed and are negative.   Physical Exam Updated Vital Signs BP (!) 147/91   Pulse (!) 57   Temp 98.3 F (36.8 C) (Oral)   Resp 18   SpO2 92%   Physical Exam Vitals and nursing note reviewed.  Constitutional:      General: He is not in acute distress.    Appearance: He is not ill-appearing.  HENT:     Head: Normocephalic.  Eyes:     Pupils: Pupils are equal, round, and reactive to light.  Cardiovascular:     Rate and Rhythm: Normal rate and regular rhythm.     Pulses: Normal pulses.     Heart sounds: Normal heart sounds. No murmur heard. No friction rub. No gallop.   Pulmonary:     Effort: Pulmonary effort is normal.     Breath sounds: Normal breath sounds.  Abdominal:     General: Abdomen is flat. Bowel sounds are normal. There is no distension.     Palpations: Abdomen is soft.     Tenderness: There is no abdominal tenderness. There is no guarding or rebound.     Comments: Abdomen soft, nondistended, nontender  to palpation in all quadrants without guarding or peritoneal signs. No rebound.   Musculoskeletal:     Cervical back: Neck supple.     Comments: Edema surrounding socks on bilateral lower extremities. No calf tenderness. Negative calf tenderness  Skin:    General: Skin is warm and dry.  Neurological:     General: No focal deficit present.     Mental Status: He is alert.     Comments: Speech is clear, able to follow commands CN III-XII intact Normal strength in upper and lower extremities bilaterally including dorsiflexion and plantar flexion, strong and equal grip strength Sensation grossly intact throughout Moves extremities without ataxia, coordination intact No pronator drift  Psychiatric:        Mood and Affect: Mood normal.        Behavior: Behavior normal.  ED Results / Procedures / Treatments   Labs (all labs ordered are listed, but only abnormal results are displayed) Labs Reviewed  CBC WITH DIFFERENTIAL/PLATELET - Abnormal; Notable for the following components:      Result Value   WBC 14.2 (*)    RBC 6.25 (*)    Hemoglobin 17.7 (*)    HCT 53.4 (*)    Neutro Abs 10.8 (*)    All other components within normal limits  BRAIN NATRIURETIC PEPTIDE - Abnormal; Notable for the following components:   B Natriuretic Peptide 148.5 (*)    All other components within normal limits  SARS CORONAVIRUS 2 (TAT 6-24 HRS)  COMPREHENSIVE METABOLIC PANEL  URINALYSIS, ROUTINE W REFLEX MICROSCOPIC  TROPONIN I (HIGH SENSITIVITY)    EKG EKG Interpretation  Date/Time:  Thursday October 08 2020 15:14:09 EST Ventricular Rate:  76 PR Interval:    QRS Duration: 99 QT Interval:  385 QTC Calculation: 433 R Axis:   96 Text Interpretation: Sinus rhythm Borderline right axis deviation No significant change since last tracing Confirmed by Blanchie Dessert [phone]) on 10/08/2020 3:34:08 PM   Radiology CT Head Wo Contrast  Result Date: 10/08/2020 CLINICAL DATA:  Weakness and headaches  EXAM: CT HEAD WITHOUT CONTRAST TECHNIQUE: Contiguous axial images were obtained from the base of the skull through the vertex without intravenous contrast. COMPARISON:  None. FINDINGS: Brain: No evidence of acute infarction, hemorrhage, hydrocephalus, extra-axial collection or mass lesion/mass effect. Vascular: No hyperdense vessel or unexpected calcification. Skull: Normal. Negative for fracture or focal lesion. Sinuses/Orbits: No acute finding. Other: Cheek implants are noted bilaterally consistent with the given clinical history. IMPRESSION: No acute intracranial abnormality noted. Electronically Signed   By: Inez Catalina M.D.   On: 10/08/2020 17:26   DG Chest Portable 1 View  Result Date: 10/08/2020 CLINICAL DATA:  Increasing weakness over the past few days. EXAM: PORTABLE CHEST 1 VIEW COMPARISON:  Single-view of the chest 09/30/2017. FINDINGS: The lungs are emphysematous. There is left worse than right mid and lower lung zone airspace disease. Heart size is normal. No pneumothorax. IMPRESSION: Left worse than right basilar airspace disease could be due to pneumonia or asymmetric pulmonary edema. Severe emphysema. Electronically Signed   By: Inge Rise M.D.   On: 10/08/2020 15:35    Procedures Procedures (including critical care time)  Medications Ordered in ED Medications  hydrochlorothiazide (MICROZIDE) capsule 25 mg (25 mg Oral Given 10/08/20 1741)    ED Course  I have reviewed the triage vital signs and the nursing notes.  Pertinent labs & imaging results that were available during my care of the patient were reviewed by me and considered in my medical decision making (see chart for details).  Clinical Course as of 10/08/20 1920  Thu Oct 08, 2020  1508 BP(!): 208/103 [CA]  1806 WBC(!): 14.2 [CA]  1821 Troponin I (High Sensitivity): 11 [CA]  1855 B Natriuretic Peptide(!): 148.5 [CA]    Clinical Course User Index [CA] Suzy Bouchard, PA-C   MDM Rules/Calculators/A&P                          55 year old male presents to the ED due to generalized weakness and fatigue x6 days.  Upon EMS arrival patient found to be extremely hypertensive at 220/118.  Patient has a history of hypertension however, has not taken his lisinopril-HCTZ for over a few months. Patient in no acute distress and non-toxic appearing. Physical exam reassuring.  Normal neurological  exam.  Edema on bilateral lower extremities around sock line. Will obtain routine labs, CT head, and UA to rule out end organ damage. BNP to rule out CHF given bilateral lower extremity edema. COVID test due to generalized weakness and fatigue.  Hydrochlorothiazide 25 mg given.  EKG personally reviewed which demonstrates normal sinus rhythm with no signs of acute ischemia.  Chest x-ray personally reviewed which demonstrates: IMPRESSION:  Left worse than right basilar airspace disease could be due to  pneumonia or asymmetric pulmonary edema.    Severe emphysema.   CBC significant for mild leukocytosis at 14.2, but otherwise reassuring.  CMP unremarkable with normal renal function and no major electrolyte derangements.  CT head personally reviewed which is negative for any acute abnormalities.  UA unremarkable with no signs of infection or hematuria.  BNP mildly elevated at 148.5.  Patient had similarly elevated BNP 5 years ago.  No history of congestive heart failure.  Covid test pending.  Initial troponin normal at 11.  Doubt ACS.  Will treat for community-acquired pneumonia given leukocytosis and chest x-ray consolidation.  Patient also discharged with HCTZ 25 mg.  Instructed patient not to take his other BP medication and to switch to hydrochlorothiazide 25 mg.  Transition of care referral placed for PCP. No signs of hypertensive emergency. BP improved while here in the ED and patient notes improvement in symptoms. Strict ED precautions discussed with patient. Patient states understanding and agrees to plan. Patient discharged  home in no acute distress and stable vitals  Discussed case with Dr. Maryan Rued who agrees with assessment and plan.  Final Clinical Impression(s) / ED Diagnoses Final diagnoses:  Elevated blood pressure reading  Generalized weakness  Community acquired pneumonia of left lower lobe of lung    Rx / DC Orders ED Discharge Orders         Ordered    hydrochlorothiazide (HYDRODIURIL) 25 MG tablet  Daily        10/08/20 1911    doxycycline (VIBRAMYCIN) 100 MG capsule  2 times daily        10/08/20 1911           Karie Kirks 10/08/20 Adria Dill, MD 10/12/20 2253

## 2020-10-08 NOTE — Telephone Encounter (Signed)
Pt called in c/o being very weak, not feeling well, shaking real bad, unable to walk or stand without holding onto something for support.  He mentioned he has been confused for the last 3-4 days and can barely function. See triage notes below for details.  I have instructed him to call 911 to be taken to the hospital.  He was agreeable to doing this.   "I just needed another opinion than my own".    Reason for Disposition . [1] Dizziness, lightheadedness, or weakness AND [2] heart beating very rapidly (e.g., > 140 / minute)  Answer Assessment - Initial Assessment Questions 1. DESCRIPTION: "Please describe your heart rate or heartbeat that you are having" (e.g., fast/slow, regular/irregular, skipped or extra beats, "palpitations")     *No Answer* I've never felt this bad.   I felt confused for last 3-4 days.  I can barely function.   I'm a little off balance.  When I close my eyes they are twitching.   2. ONSET: "When did it start?" (Minutes, hours or days)      Couple of days.  I thought I would be able to work today but I can't.   I don't have covid symptoms.   No history of blood clots.   I have tachycardia and hypertension.   I take lisinopril.   I don't have a BP cuff. 3. DURATION: "How long does it last" (e.g., seconds, minutes, hours)     I've never felt this bad.   A little diarrhea that started this morning more a loose stool than diarrhea.  No urinary symptoms. 4. PATTERN "Does it come and go, or has it been constant since it started?"  "Does it get worse with exertion?"   "Are you feeling it now?"     It's a constant feeling of feeling bad.   No recent surgeries. My good eye is watery more than usual.   "I have bad eyes anyway".   5. TAP: "Using your hand, can you tap out what you are feeling on a chair or table in front of you, so that I can hear?" (Note: not all patients can do this)       Doesn't know how to check it.   I'm worn out.   I'm  standing on the back porch but only way I  can stand here is to hold on.   Denies tarry black stools.   6. HEART RATE: "Can you tell me your heart rate?" "How many beats in 15 seconds?"  (Note: not all patients can do this)       *No Answer* 7. RECURRENT SYMPTOM: "Have you ever had this before?" If Yes, ask: "When was the last time?" and "What happened that time?"      No 8. CAUSE: "What do you think is causing the palpitations?"     I'm shaking real bad.   Not diabetes.   9. CARDIAC HISTORY: "Do you have any history of heart disease?" (e.g., heart attack, angina, bypass surgery, angioplasty, arrhythmia)      Tachycardia 10. OTHER SYMPTOMS: "Do you have any other symptoms?" (e.g., dizziness, chest pain, sweating, difficulty breathing)       I'm shaking so bad I can't hardly stand here.   If I wasn't holding onto the rail of my porch I feel sure I would fall. 11. PREGNANCY: "Is there any chance you are pregnant?" "When was your last menstrual period?"       N/A  Protocols used:  HEART RATE AND HEARTBEAT QUESTIONS-A-AH

## 2020-10-09 LAB — SARS CORONAVIRUS 2 (TAT 6-24 HRS): SARS Coronavirus 2: NEGATIVE

## 2020-10-20 ENCOUNTER — Encounter (HOSPITAL_COMMUNITY): Payer: Self-pay | Admitting: Emergency Medicine

## 2020-10-20 ENCOUNTER — Emergency Department (HOSPITAL_COMMUNITY)
Admission: EM | Admit: 2020-10-20 | Discharge: 2020-10-21 | Disposition: A | Discharge status: Home / Self Care | Payer: HRSA Program | Attending: Emergency Medicine | Admitting: Emergency Medicine

## 2020-10-20 ENCOUNTER — Emergency Department (HOSPITAL_COMMUNITY): Payer: HRSA Program

## 2020-10-20 ENCOUNTER — Other Ambulatory Visit: Payer: Self-pay

## 2020-10-20 DIAGNOSIS — U071 COVID-19: Secondary | ICD-10-CM

## 2020-10-20 DIAGNOSIS — F17200 Nicotine dependence, unspecified, uncomplicated: Secondary | ICD-10-CM | POA: Insufficient documentation

## 2020-10-20 DIAGNOSIS — R5383 Other fatigue: Secondary | ICD-10-CM

## 2020-10-20 DIAGNOSIS — Z7951 Long term (current) use of inhaled steroids: Secondary | ICD-10-CM | POA: Diagnosis not present

## 2020-10-20 DIAGNOSIS — R0602 Shortness of breath: Secondary | ICD-10-CM | POA: Diagnosis present

## 2020-10-20 DIAGNOSIS — Z79899 Other long term (current) drug therapy: Secondary | ICD-10-CM | POA: Insufficient documentation

## 2020-10-20 DIAGNOSIS — J441 Chronic obstructive pulmonary disease with (acute) exacerbation: Secondary | ICD-10-CM | POA: Diagnosis not present

## 2020-10-20 DIAGNOSIS — I10 Essential (primary) hypertension: Secondary | ICD-10-CM | POA: Diagnosis not present

## 2020-10-20 LAB — CBC
HCT: 51.7 % (ref 39.0–52.0)
Hemoglobin: 17.2 g/dL — ABNORMAL HIGH (ref 13.0–17.0)
MCH: 28.5 pg (ref 26.0–34.0)
MCHC: 33.3 g/dL (ref 30.0–36.0)
MCV: 85.6 fL (ref 80.0–100.0)
Platelets: 231 10*3/uL (ref 150–400)
RBC: 6.04 MIL/uL — ABNORMAL HIGH (ref 4.22–5.81)
RDW: 12.7 % (ref 11.5–15.5)
WBC: 6.2 10*3/uL (ref 4.0–10.5)
nRBC: 0 % (ref 0.0–0.2)

## 2020-10-20 LAB — BASIC METABOLIC PANEL
Anion gap: 9 (ref 5–15)
BUN: 12 mg/dL (ref 6–20)
CO2: 29 mmol/L (ref 22–32)
Calcium: 9.5 mg/dL (ref 8.9–10.3)
Chloride: 94 mmol/L — ABNORMAL LOW (ref 98–111)
Creatinine, Ser: 1.07 mg/dL (ref 0.61–1.24)
GFR, Estimated: >60 mL/min (ref >60)
Glucose, Bld: 109 mg/dL — ABNORMAL HIGH (ref 70–99)
Potassium: 4.2 mmol/L (ref 3.5–5.1)
Sodium: 132 mmol/L — ABNORMAL LOW (ref 135–145)

## 2020-10-20 LAB — POC SARS CORONAVIRUS 2 AG -  ED: SARS Coronavirus 2 Ag: POSITIVE — AB

## 2020-10-20 MED ORDER — ACETAMINOPHEN 325 MG PO TABS
650.0000 mg | ORAL_TABLET | Freq: Once | ORAL | Status: AC
  Administered 2020-10-20: 650 mg via ORAL
  Filled 2020-10-20: qty 2

## 2020-10-20 NOTE — ED Notes (Signed)
Patient Ambulatory Oxygen level was 94 percent

## 2020-10-20 NOTE — ED Triage Notes (Signed)
Patient states he was dx w/ PNA 10 days ago, states he feels worse today then he did 10 days ago. Small production of yellow sputum with cough. Denies fevers, hot and cold chills at home, endorses some SOB with ambulation.

## 2020-10-20 NOTE — ED Provider Notes (Signed)
[facility] DEPT Provider Note   CSN: [id number] Arrival date & time: 10/20/20  1121     History Chief Complaint  Patient presents with  . Pneumonia    Ray Griffin is a 55 y.o. male.  He has a history of COPD.  He was here about 10 days ago and diagnosed with pneumonia and treated with Doxy.  He said he was improving until yesterday when he started to become more fatigued.  Cough associated with a little bit of phlegm.  Mild shortness of breath.  He is not COVID vaccinated.  The history is provided by the patient.  Shortness of Breath Severity:  Mild Onset quality:  Gradual Duration:  2 days Timing:  Intermittent Progression:  Worsening Chronicity:  Recurrent Relieved by:  Nothing Worsened by:  Activity Ineffective treatments:  None tried Associated symptoms: cough   Associated symptoms: no abdominal pain, no chest pain, no fever, no headaches, no hemoptysis, no neck pain, no rash, no sore throat, no sputum production, no vomiting and no wheezing   Risk factors: tobacco use        Past Medical History:  Diagnosis Date  . Anxiety   . COPD (chronic obstructive pulmonary disease) ([place])   . Emphysema   . Hypertension   . Tachycardia     Patient Active Problem List   Diagnosis Date Noted  . Pulmonary nodules/lesions, multiple 10/01/2017  . COPD exacerbation ([place]) 10/01/2017  . Acute cholecystitis 12/04/2014  . COPD with acute exacerbation ([place]) 12/04/2014  . Gallstones 12/03/2014  . SINUSITIS - ACUTE-NOS 10/17/2009  . Cigarette smoker 01/08/2008  . PANIC ATTACK, ACUTE 06/18/2007  . ALLERGIC RHINITIS 06/18/2007  . COPD GOLD III/ still smoking 06/18/2007  . DEPRESSION 04/04/2007  . Essential hypertension 04/04/2007    Past Surgical History:  Procedure Laterality Date  . CHOLECYSTECTOMY N/A 12/04/2014   Procedure: LAPAROSCOPIC CHOLECYSTECTOMY WITH POSSIBLE INTRAOPERATIVE CHOLANGIOGRAM;  Surgeon: Gayland Curry, MD;  Location: [facility]  [facility];  Service: General;  Laterality: N/A;  . FACIAL COSMETIC SURGERY    . THORACOTOMY         Family History  Problem Relation Age of Onset  . Diabetes Mother   . Heart failure Mother   . Diabetes Father   . Heart failure Father   . Emphysema Father        smoked  . Bipolar disorder Brother   . Thyroid cancer Maternal Grandmother     Social History   Tobacco Use  . Smoking status: Current Every Day Smoker    Packs/day: 1.50    Years: 30.00    Pack years: 45.00  . Smokeless tobacco: Never Used  Substance Use Topics  . Alcohol use: No    Comment: recently quit 3 mths ago  . Drug use: No    Home Medications Prior to Admission medications   Medication Sig Start Date End Date Taking? Authorizing Provider  albuterol (PROVENTIL) (2.5 MG/3ML) 0.083% nebulizer solution Take 3 mLs (2.5 mg total) by nebulization every 6 (six) hours as needed for wheezing or shortness of breath. Patient not taking: No sig reported 10/04/17   Roxan Hockey, MD  albuterol (VENTOLIN HFA) 108 (90 Base) MCG/ACT inhaler Inhale 2 puffs into the lungs every 4 (four) hours as needed for wheezing or shortness of breath. Patient not taking: No sig reported 09/14/19   Gildardo Pounds, NP  budesonide-formoterol [facility]) 160-4.5 MCG/ACT inhaler Inhale 2 puffs into the lungs 2 (two) times daily. Patient not taking:  No sig reported 10/17/17   Tanda Rockers, MD  doxycycline (VIBRAMYCIN) 100 MG capsule Take 1 capsule (100 mg total) by mouth 2 (two) times daily. 10/08/20   Suzy Bouchard, PA-C  hydrochlorothiazide (HYDRODIURIL) 25 MG tablet Take 1 tablet (25 mg total) by mouth daily. 10/08/20   Suzy Bouchard, PA-C  lisinopril-hydrochlorothiazide (ZESTORETIC) 20-25 MG tablet Take 1 tablet by mouth daily. 09/14/19   Gildardo Pounds, NP  Respiratory Therapy Supplies (NEBULIZER) [place] Please provide patient with nebulizer for personal home use. Patient not taking: Reported on 10/08/2020 09/14/19   Gildardo Pounds, NP    Allergies    Patient has no known allergies.  Review of Systems   Review of Systems  Constitutional: Positive for fatigue. Negative for fever.  HENT: Negative for sore throat.   Eyes: Negative for visual disturbance.  Respiratory: Positive for cough and shortness of breath. Negative for hemoptysis, sputum production and wheezing.   Cardiovascular: Negative for chest pain.  Gastrointestinal: Negative for abdominal pain and vomiting.  Genitourinary: Negative for dysuria.  Musculoskeletal: Negative for neck pain.  Skin: Negative for rash.  Neurological: Negative for headaches.    Physical Exam Updated Vital Signs BP (!) 144/91 (BP Location: Right Arm)   Pulse 77   Temp 99.5 F (37.5 C) (Oral)   Resp 18   Ht 6\' 1"  (1.854 m)   Wt 120 kg   SpO2 95%   BMI 34.90 kg/m   Physical Exam Vitals and nursing note reviewed.  Constitutional:      Appearance: Normal appearance. He is well-developed and well-nourished.  HENT:     Head: Normocephalic and atraumatic.  Eyes:     Conjunctiva/sclera: Conjunctivae normal.  Cardiovascular:     Rate and Rhythm: Normal rate and regular rhythm.     Heart sounds: No murmur heard.   Pulmonary:     Effort: Pulmonary effort is normal. No respiratory distress.     Breath sounds: Normal breath sounds.  Abdominal:     Palpations: Abdomen is soft.     Tenderness: There is no abdominal tenderness.  Musculoskeletal:        General: No deformity, signs of injury or edema. Normal range of motion.     Cervical back: Neck supple.  Skin:    General: Skin is warm and dry.     Capillary Refill: Capillary refill takes less than 2 seconds.  Neurological:     General: No focal deficit present.     Mental Status: He is alert.  Psychiatric:        Mood and Affect: Mood and affect normal.     ED Results / Procedures / Treatments   Labs (all labs ordered are listed, but only abnormal results are displayed) Labs Reviewed  BASIC  METABOLIC PANEL - Abnormal; Notable for the following components:      Result Value   Sodium 132 (*)    Chloride 94 (*)    Glucose, Bld 109 (*)    All other components within normal limits  CBC - Abnormal; Notable for the following components:   RBC 6.04 (*)    Hemoglobin 17.2 (*)    All other components within normal limits  POC SARS CORONAVIRUS 2 AG -  ED - Abnormal; Notable for the following components:   SARS Coronavirus 2 Ag POSITIVE (*)    All other components within normal limits    EKG None  Radiology DG Chest 2 View  Result Date: 10/20/2020 CLINICAL  DATA:  Short of breath EXAM: CHEST - 2 VIEW COMPARISON:  10/08/2020, 09/16/2017 FINDINGS: Bibasilar pleural and parenchymal scarring is stable and without interval change. No superimposed acute infiltrate or effusion. Scarring in the right lung apex unchanged. Heart size and vascularity normal IMPRESSION: Chronic scarring in the bases.  No acute finding. Electronically Signed   By: Franchot Gallo M.D.   On: 10/20/2020 12:38    Procedures Procedures (including critical care time)  Medications Ordered in ED Medications  acetaminophen (TYLENOL) tablet 650 mg (650 mg Oral Given 10/20/20 1800)    ED Course  I have reviewed the triage vital signs and the nursing notes.  Pertinent labs & imaging results that were available during my care of the patient were reviewed by me and considered in my medical decision making (see chart for details).  Clinical Course as of 10/21/20 1059  Tue Oct 20, 2020  2330 Texted a trending pulse ox and he did not drop below 94%.  We will put a referral into the infusion center.  Discharged with return instructions. [MB]    Clinical Course User Index [MB] Hayden Rasmussen, MD   MDM Rules/Calculators/A&P                         Ray Griffin was evaluated in Emergency Department on 10/20/2020 for the symptoms described in the history of present illness. He was evaluated in the context of the  global COVID-19 pandemic, which necessitated consideration that the patient might be at risk for infection with the SARS-CoV-2 virus that causes COVID-19. Institutional protocols and algorithms that pertain to the evaluation of patients at risk for COVID-19 are in a state of rapid change based on information released by regulatory bodies including the CDC and federal and state organizations. These policies and algorithms were followed during the patient's care in the ED.  This patient complains of increased fatigue after recent pneumonia diagnosis; this involves an extensive number of treatment Options and is a complaint that carries with it a high risk of complications and Morbidity. The differential includes COVID, recurrent pneumonia, CHF, COPD, anemia, metabolic derangement  I ordered, reviewed and interpreted labs, which included normal white count, stable hemoglobin, chemistries with mildly low sodium, COVID testing positive I ordered imaging studies which included chest x-ray and I independently    visualized and interpreted imaging which showed chronic scarring right base, no acute infiltrates Previous records obtained and reviewed in epic including prior ED visit  After the interventions stated above, I reevaluated the patient and found patient to be oxygenating well.  Trending pulse ox does not show any desaturations.  Reviewed work-up with him.  Placed referral into antibody infusion center.  Return instructions discussed.   Final Clinical Impression(s) / ED Diagnoses Final diagnoses:  [id number] virus infection  Fatigue, unspecified type    Rx / DC Orders ED Discharge Orders    None       Hayden Rasmussen, MD 10/21/20 1106

## 2020-10-20 NOTE — Discharge Instructions (Addendum)
You were seen in the emergency department for worsening breathing symptoms and fatigue after being treated for pneumonia.  You tested positive for COVID.  Your oxygen saturations were high enough that you do not require admission to the hospital at this time.  Your symptoms may worsen so please monitor them.  Please follow-up with the post-COVID clinic at [facility].  We also put in a referral for you as you may qualify for monoclonal antibodies.  They would call you on the phone if you are a candidate.  Return to the emergency department for any worsening or concerning symptoms.  You should isolate at home, rest, drink plenty of fluids, Tylenol as needed for pain and fever.

## 2020-10-21 ENCOUNTER — Telehealth: Payer: Self-pay | Admitting: Unknown Physician Specialty

## 2020-10-21 ENCOUNTER — Telehealth (HOSPITAL_COMMUNITY): Payer: Self-pay

## 2020-10-21 NOTE — Telephone Encounter (Signed)
I connected by phone with Ray Griffin on 10/21/2020 at 4:22 PM to discuss the potential use of a new treatment for mild to moderate COVID-19 viral infection in non-hospitalized patients.  This patient is a 55 y.o. male that meets the FDA criteria for Emergency Use Authorization of COVID monoclonal antibody sotrovimab.  Has a (+) direct SARS-CoV-2 viral test result  Has mild or moderate COVID-19   Is NOT hospitalized due to COVID-19  Is within 10 days of symptom onset  Has at least one of the high risk factor(s) for progression to severe COVID-19 and/or hospitalization as defined in EUA.  Specific high risk criteria : BMI > 25 high SVI   I have spoken and communicated the following to the patient or parent/caregiver regarding COVID monoclonal antibody treatment:  1. FDA has authorized the emergency use for the treatment of mild to moderate COVID-19 in adults and pediatric patients with positive results of direct SARS-CoV-2 viral testing who are 75 years of age and older weighing at least 40 kg, and who are at high risk for progressing to severe COVID-19 and/or hospitalization.  2. The significant known and potential risks and benefits of COVID monoclonal antibody, and the extent to which such potential risks and benefits are unknown.  3. Information on available alternative treatments and the risks and benefits of those alternatives, including clinical trials.  4. Patients treated with COVID monoclonal antibody should continue to self-isolate and use infection control measures (e.g., wear mask, isolate, social distance, avoid sharing personal items, clean and disinfect "high touch" surfaces, and frequent handwashing) according to CDC guidelines.   5. The patient or parent/caregiver has the option to accept or refuse COVID monoclonal antibody treatment.  After reviewing this information with the patient would like to think about tx, Kathrine Haddock, NP 10/21/2020 4:22 PM   Sx onset  1/16 Call back number given

## 2020-10-21 NOTE — Telephone Encounter (Signed)
Called to discuss with patient about COVID-19 symptoms and the use of one of the available treatments for those with mild to moderate Covid symptoms and at a high risk of hospitalization.  Pt may appear to qualify for outpatient treatment due to co-morbid conditions and/or a member of an at-risk group in accordance with the FDA Emergency Use Authorization.    Symptom onset: 10/18/20 Vaccinated: No Booster? No Immunocompromised? No Qualifiers: COPD, smoker, HTN, BMI>25   RN informed pt that an APP will reach out and if he qualifies they may offer him treatment.   Janine Ores, RN

## 2020-10-28 ENCOUNTER — Telehealth: Payer: Self-pay | Admitting: General Practice

## 2020-10-28 NOTE — Telephone Encounter (Signed)
   OSTIN MATHEY DOB: October 22, 1965 MRN: [id number]   RIDER WAIVER AND RELEASE OF LIABILITY  For purposes of improving physical access to our facilities, [facility] is pleased to partner with third parties to provide [facility] patients or other authorized individuals the option of convenient, on-demand ground transportation services (the [place]") through use of the technology service that enables users to request on-demand ground transportation from independent third-party providers.  By opting to use and accept these [employer], I, the undersigned, hereby agree on behalf of myself, and on behalf of any minor child using the [employer] for whom I am the parent or legal guardian, as follows:  1. Government social research officer provided to me are provided by independent third-party transportation providers who are not [employer] or employees and who are unaffiliated with [employer]. 2. Agency is neither a transportation carrier nor a common or public carrier. 3. [facility] has no control over the quality or safety of the transportation that occurs as a result of the [employer]. 4. [facility] cannot guarantee that any third-party transportation provider will complete any arranged transportation service. 5. [facility] makes no representation, warranty, or guarantee regarding the reliability, timeliness, quality, safety, suitability, or availability of any of the Transport Services or that they will be error free. 6. I fully understand that traveling by vehicle involves risks and dangers of serious bodily injury, including permanent disability, paralysis, and death. I agree, on behalf of myself and on behalf of any minor child using the Transport Services for whom I am the parent or legal guardian, that the entire risk arising out of my use of the [employer] remains solely with me, to the maximum extent permitted under applicable law. 7. The [employer] are provided "as is" and "as available." [facility] disclaims all representations and warranties, express, implied or statutory, not expressly set out in these terms, including the implied warranties of merchantability and fitness for a particular purpose. 8. I hereby waive and release [facility], its agents, employees, officers, directors, representatives, insurers, attorneys, assigns, successors, subsidiaries, and affiliates from any and all past, present, or future claims, demands, liabilities, actions, causes of action, or suits of any kind directly or indirectly arising from acceptance and use of the [employer]. 9. I further waive and release [facility] and its affiliates from all present and future liability and responsibility for any injury or death to persons or damages to property caused by or related to the use of the [employer]. 10. I have read this Waiver and Release of Liability, and I understand the terms used in it and their legal significance. This Waiver is freely and voluntarily given with the understanding that my right (as well as the right of any minor child for whom I am the parent or legal guardian using the [employer]) to legal recourse against  in connection with the [employer] is knowingly surrendered in return for use of these services.   I attest that I read the consent document to Raye Sorrow, gave Mr. Havlin the opportunity to ask questions and answered the questions asked (if any). I affirm that Raye Sorrow then provided consent for he's participation in this program.     Katy Apo

## 2020-10-30 ENCOUNTER — Ambulatory Visit (INDEPENDENT_AMBULATORY_CARE_PROVIDER_SITE_OTHER): Payer: Self-pay | Admitting: Nurse Practitioner

## 2020-10-30 ENCOUNTER — Other Ambulatory Visit: Payer: Self-pay

## 2020-10-30 VITALS — BP 152/98 | HR 95 | Temp 97.5°F | Ht 73.0 in | Wt 259.0 lb

## 2020-10-30 DIAGNOSIS — U071 COVID-19: Secondary | ICD-10-CM

## 2020-10-30 MED ORDER — AZITHROMYCIN 250 MG PO TABS: ORAL_TABLET | ORAL | 0 refills | Status: DC

## 2020-10-30 MED ORDER — PREDNISONE 10 MG PO TABS: ORAL_TABLET | ORAL | 0 refills | Status: DC

## 2020-10-30 NOTE — Patient Instructions (Addendum)
Covid 19 Cough:   Stay well hydrated  Stay active  Deep breathing exercises  May take tylenol or fever or pain  May take mucinex  twice daily   Will order azithromycin  Will order prednisone  Follow up:  Follow up in 2 weeks or sooner if needed  

## 2020-10-30 NOTE — Assessment & Plan Note (Signed)
Cough:   Stay well hydrated  Stay active  Deep breathing exercises  May take tylenol or fever or pain  May take mucinex twice daily  Will order azithromycin  Will order prednisone    Follow up:  Follow up in 2 weeks or sooner if needed

## 2020-10-30 NOTE — Progress Notes (Signed)
@Patient  ID: Ray Griffin, male    DOB: 1966-05-30, 55 y.o.   MRN: [id number]  Chief Complaint  Patient presents with  . Covid Positive    +1/18, Sx: fatigue, congestion and breathing issues. Was seen at ED 1/6 was given doxycyline for pneumonia.  Per patient he for got to take his BP meds this morning.     Referring provider: No ref. provider found  HPI  Patient presents today for post COVID care clinic visit.  Patient was diagnosed with COVID on 10/20/2020.  He was seen in the ED before that date on 10/08/2020 and was treated with doxycycline for pneumonia at that visit.  When he returned to the ED on the 18th he tested positive.  He states that since that time he has been having ongoing cough, chest congestion, shortness of breath, fatigue.  His last chest x-ray on 10/20/2020 did show that the pneumonia had cleared. Denies f/c/s, n/v/d, hemoptysis, PND, chest pain or edema.   No Known Allergies  Immunization History  Administered Date(s) Administered  . Pneumococcal Conjugate-13 01/18/2011    Past Medical History:  Diagnosis Date  . Anxiety   . COPD (chronic obstructive pulmonary disease) ([place])   . Emphysema   . Hypertension   . Tachycardia     Tobacco History: Social History   Tobacco Use  Smoking Status Current Every Day Smoker  . Packs/day: 1.50  . Years: 30.00  . Pack years: 45.00  Smokeless Tobacco Never Used   Ready to quit: Not Answered Counseling given: Not Answered   Outpatient Encounter Medications as of 10/30/2020  Medication Sig  . azithromycin (ZITHROMAX) 250 MG tablet Take 2 tablets (500 mg) on day 1, then take 1 tablet (250 mg) on days 2-5  . hydrochlorothiazide (HYDRODIURIL) 25 MG tablet Take 1 tablet (25 mg total) by mouth daily.  . predniSONE (DELTASONE) 10 MG tablet Take 4 tabs for 2 days, then 3 tabs for 2 days, then 2 tabs for 2 days, then 1 tab for 2 days, then stop  . albuterol (VENTOLIN HFA) 108 (90 Base) MCG/ACT inhaler Inhale 2 puffs into  the lungs every 4 (four) hours as needed for wheezing or shortness of breath. (Patient not taking: Reported on 10/30/2020)  . budesonide-formoterol (SYMBICORT) 160-4.5 MCG/ACT inhaler Inhale 2 puffs into the lungs 2 (two) times daily. (Patient not taking: Reported on 10/30/2020)  . Respiratory Therapy Supplies (NEBULIZER) [place] Please provide patient with nebulizer for personal home use. (Patient not taking: No sig reported)   No facility-administered encounter medications on file as of 10/30/2020.     Review of Systems  Review of Systems  Constitutional: Positive for fatigue.  HENT: Negative.   Respiratory: Positive for cough and shortness of breath.   Cardiovascular: Negative.   Gastrointestinal: Negative.   Allergic/Immunologic: Negative.   Neurological: Negative.   Psychiatric/Behavioral: Negative.        Physical Exam  BP (!) 152/98   Pulse 95   Temp (!) 97.5 F (36.4 C)   Ht 6\' 1"  (1.854 m)   Wt 259 lb 0.1 oz (117.5 kg)   SpO2 95%   BMI 34.17 kg/m   Wt Readings from Last 5 Encounters:  10/30/20 259 lb 0.1 oz (117.5 kg)  10/20/20 264 lb 8.8 oz (120 kg)  10/17/17 258 lb (117 kg)  09/30/17 240 lb (108.9 kg)  11/28/16 238 lb (108 kg)     Physical Exam Vitals and nursing note reviewed.  Constitutional:  General: He is not in acute distress.    Appearance: He is well-developed and well-nourished.  Cardiovascular:     Rate and Rhythm: Normal rate and regular rhythm.  Pulmonary:     Effort: Pulmonary effort is normal.     Breath sounds: Normal breath sounds.  Musculoskeletal:     Right lower leg: No edema.     Left lower leg: No edema.  Skin:    General: Skin is warm and dry.  Neurological:     Mental Status: He is alert and oriented to person, place, and time.  Psychiatric:        Mood and Affect: Mood and affect normal.      Lab Results:  CBC    Component Value Date/Time   WBC 6.2 10/20/2020 1332   RBC 6.04 (H) 10/20/2020 1332   HGB 17.2 (H)  10/20/2020 1332   HCT 51.7 10/20/2020 1332   PLT 231 10/20/2020 1332   MCV 85.6 10/20/2020 1332   MCH 28.5 10/20/2020 1332   MCHC 33.3 10/20/2020 1332   RDW 12.7 10/20/2020 1332   LYMPHSABS 2.5 10/08/2020 1640   MONOABS 0.7 10/08/2020 1640   EOSABS 0.1 10/08/2020 1640   BASOSABS 0.1 10/08/2020 1640    BMET    Component Value Date/Time   NA 132 (L) 10/20/2020 1332   K 4.2 10/20/2020 1332   CL 94 (L) 10/20/2020 1332   CO2 29 10/20/2020 1332   GLUCOSE 109 (H) 10/20/2020 1332   BUN 12 10/20/2020 1332   CREATININE 1.07 10/20/2020 1332   CALCIUM 9.5 10/20/2020 1332   GFRNONAA >60 10/20/2020 1332   GFRAA >60 10/03/2017 0557    BNP    Component Value Date/Time   BNP 148.5 (H) 10/08/2020 1522    ProBNP No results found for: PROBNP  Imaging: DG Chest 2 View  Result Date: 10/20/2020 CLINICAL DATA:  Short of breath EXAM: CHEST - 2 VIEW COMPARISON:  10/08/2020, 09/16/2017 FINDINGS: Bibasilar pleural and parenchymal scarring is stable and without interval change. No superimposed acute infiltrate or effusion. Scarring in the right lung apex unchanged. Heart size and vascularity normal IMPRESSION: Chronic scarring in the bases.  No acute finding. Electronically Signed   By: Franchot Gallo M.D.   On: 10/20/2020 12:38   CT Head Wo Contrast  Result Date: 10/08/2020 CLINICAL DATA:  Weakness and headaches EXAM: CT HEAD WITHOUT CONTRAST TECHNIQUE: Contiguous axial images were obtained from the base of the skull through the vertex without intravenous contrast. COMPARISON:  None. FINDINGS: Brain: No evidence of acute infarction, hemorrhage, hydrocephalus, extra-axial collection or mass lesion/mass effect. Vascular: No hyperdense vessel or unexpected calcification. Skull: Normal. Negative for fracture or focal lesion. Sinuses/Orbits: No acute finding. Other: Cheek implants are noted bilaterally consistent with the given clinical history. IMPRESSION: No acute intracranial abnormality noted.  Electronically Signed   By: Inez Catalina M.D.   On: 10/08/2020 17:26   DG Chest Portable 1 View  Result Date: 10/08/2020 CLINICAL DATA:  Increasing weakness over the past few days. EXAM: PORTABLE CHEST 1 VIEW COMPARISON:  Single-view of the chest 09/30/2017. FINDINGS: The lungs are emphysematous. There is left worse than right mid and lower lung zone airspace disease. Heart size is normal. No pneumothorax. IMPRESSION: Left worse than right basilar airspace disease could be due to pneumonia or asymmetric pulmonary edema. Severe emphysema. Electronically Signed   By: Inge Rise M.D.   On: 10/08/2020 15:35     Assessment & Plan:   COVID-19 Cough:  Stay well hydrated  Stay active  Deep breathing exercises  May take tylenol or fever or pain  May take mucinex twice daily  Will order azithromycin  Will order prednisone    Follow up:  Follow up in 2 weeks or sooner if needed      Fenton Foy, NP 10/30/2020

## 2020-11-12 ENCOUNTER — Ambulatory Visit: Payer: Self-pay

## 2020-12-06 ENCOUNTER — Other Ambulatory Visit: Payer: Self-pay

## 2020-12-06 ENCOUNTER — Encounter (HOSPITAL_COMMUNITY): Payer: Self-pay | Admitting: Emergency Medicine

## 2020-12-06 ENCOUNTER — Emergency Department (HOSPITAL_COMMUNITY)
Admission: EM | Admit: 2020-12-06 | Discharge: 2020-12-06 | Disposition: A | Discharge status: Home / Self Care | Payer: Self-pay | Attending: Emergency Medicine | Admitting: Emergency Medicine

## 2020-12-06 DIAGNOSIS — F17200 Nicotine dependence, unspecified, uncomplicated: Secondary | ICD-10-CM | POA: Insufficient documentation

## 2020-12-06 DIAGNOSIS — Z79899 Other long term (current) drug therapy: Secondary | ICD-10-CM | POA: Insufficient documentation

## 2020-12-06 DIAGNOSIS — Z8616 Personal history of COVID-19: Secondary | ICD-10-CM | POA: Insufficient documentation

## 2020-12-06 DIAGNOSIS — Z7951 Long term (current) use of inhaled steroids: Secondary | ICD-10-CM | POA: Insufficient documentation

## 2020-12-06 DIAGNOSIS — R030 Elevated blood-pressure reading, without diagnosis of hypertension: Secondary | ICD-10-CM

## 2020-12-06 DIAGNOSIS — J441 Chronic obstructive pulmonary disease with (acute) exacerbation: Secondary | ICD-10-CM | POA: Insufficient documentation

## 2020-12-06 DIAGNOSIS — I10 Essential (primary) hypertension: Secondary | ICD-10-CM | POA: Insufficient documentation

## 2020-12-06 DIAGNOSIS — K029 Dental caries, unspecified: Secondary | ICD-10-CM | POA: Insufficient documentation

## 2020-12-06 MED ORDER — PENICILLIN V POTASSIUM 500 MG PO TABS: 500.0000 mg | ORAL_TABLET | Freq: Four times a day (QID) | ORAL | 0 refills | Status: AC

## 2020-12-06 MED ORDER — CHLORHEXIDINE GLUCONATE 0.12 % MT SOLN: 15.0000 mL | Freq: Two times a day (BID) | OROMUCOSAL | 0 refills | Status: DC

## 2020-12-06 MED ORDER — PENICILLIN V POTASSIUM 500 MG PO TABS
500.0000 mg | ORAL_TABLET | Freq: Once | ORAL | Status: AC
  Administered 2020-12-06: 500 mg via ORAL
  Filled 2020-12-06: qty 1

## 2020-12-06 NOTE — ED Triage Notes (Signed)
Patient reports waking up this morning with left-sided facial swelling and pain. He reports having dental issues and is concerned for an abscess. Has not seen a dentist in several years. Denies difficulty breathing. Denies fever or chills. Patient endorses a headache.

## 2020-12-06 NOTE — ED Provider Notes (Signed)
[place] DEPT Provider Note   CSN: [id number] Arrival date & time: 12/06/20  1530     History No chief complaint on file.   Ray Griffin is a 55 y.o. male history of obesity, emphysema, hypertension, anxiety  Patient presents today for evaluation of right lower dental pain patient reports he has chronic dental pain and several cavities.  He was unable to follow-up with his dentist because of the Sunday.  He reports that he was chewing on a sandwich yesterday when he had sudden onset right lower dental pain which he feels is worse than normal he reports this is moderate intensity throbbing constant worsened with chewing on the right side no alleviating factors, pain will occasionally radiate to his head when severe.  Patient reports this morning when he woke up he noticed some swelling to his right lower face.  Denies fever/chills, vision changes, trismus, difficulty swallowing, drooling, voice change, nausea/vomiting or any additional concerns.  HPI     Past Medical History:  Diagnosis Date  . Anxiety   . COPD (chronic obstructive pulmonary disease) ([place])   . Emphysema   . Hypertension   . Tachycardia     Patient Active Problem List   Diagnosis Date Noted  . COVID-19 10/30/2020  . Pulmonary nodules/lesions, multiple 10/01/2017  . COPD exacerbation ([place]) 10/01/2017  . Acute cholecystitis 12/04/2014  . COPD with acute exacerbation ([place]) 12/04/2014  . Gallstones 12/03/2014  . SINUSITIS - ACUTE-NOS 10/17/2009  . Cigarette smoker 01/08/2008  . PANIC ATTACK, ACUTE 06/18/2007  . ALLERGIC RHINITIS 06/18/2007  . COPD GOLD III/ still smoking 06/18/2007  . DEPRESSION 04/04/2007  . Essential hypertension 04/04/2007    Past Surgical History:  Procedure Laterality Date  . CHOLECYSTECTOMY N/A 12/04/2014   Procedure: LAPAROSCOPIC CHOLECYSTECTOMY WITH POSSIBLE INTRAOPERATIVE CHOLANGIOGRAM;  Surgeon: Gayland Curry, MD;  Location: [facility];  Service:  General;  Laterality: N/A;  . FACIAL COSMETIC SURGERY    . THORACOTOMY         Family History  Problem Relation Age of Onset  . Diabetes Mother   . Heart failure Mother   . Diabetes Father   . Heart failure Father   . Emphysema Father        smoked  . Bipolar disorder Brother   . Thyroid cancer Maternal Grandmother     Social History   Tobacco Use  . Smoking status: Current Every Day Smoker    Packs/day: 1.50    Years: 30.00    Pack years: 45.00  . Smokeless tobacco: Never Used  Substance Use Topics  . Alcohol use: No    Comment: recently quit 3 mths ago  . Drug use: No    Home Medications Prior to Admission medications   Medication Sig Start Date End Date Taking? Authorizing Provider  chlorhexidine (PERIDEX) 0.12 % solution Use as directed 15 mLs in the mouth or throat 2 (two) times daily. 12/06/20  Yes Nuala Alpha A, PA-C  penicillin v potassium (VEETID) 500 MG tablet Take 1 tablet (500 mg total) by mouth 4 (four) times daily for 7 days. 12/06/20 12/13/20 Yes Nuala Alpha A, PA-C  albuterol (VENTOLIN HFA) 108 (90 Base) MCG/ACT inhaler Inhale 2 puffs into the lungs every 4 (four) hours as needed for wheezing or shortness of breath. Patient not taking: Reported on 10/30/2020 09/14/19   Gildardo Pounds, NP  azithromycin (ZITHROMAX) 250 MG tablet Take 2 tablets (500 mg) on day 1, then take 1 tablet (250 mg)  on days 2-5 10/30/20   Fenton Foy, NP  budesonide-formoterol [facility]) 160-4.5 MCG/ACT inhaler Inhale 2 puffs into the lungs 2 (two) times daily. Patient not taking: Reported on 10/30/2020 10/17/17   Tanda Rockers, MD  hydrochlorothiazide (HYDRODIURIL) 25 MG tablet Take 1 tablet (25 mg total) by mouth daily. 10/08/20   Suzy Bouchard, PA-C  predniSONE (DELTASONE) 10 MG tablet Take 4 tabs for 2 days, then 3 tabs for 2 days, then 2 tabs for 2 days, then 1 tab for 2 days, then stop 10/30/20   Fenton Foy, NP  Respiratory Therapy Supplies (NEBULIZER) DEVI  Please provide patient with nebulizer for personal home use. Patient not taking: No sig reported 09/14/19   Gildardo Pounds, NP    Allergies    Patient has no known allergies.  Review of Systems   Review of Systems  Constitutional: Negative.  Negative for chills and fever.  HENT: Positive for dental problem and facial swelling. Negative for drooling, sore throat, trouble swallowing and voice change.   Eyes: Negative.  Negative for visual disturbance.  Gastrointestinal: Negative.  Negative for abdominal pain, nausea and vomiting.    Physical Exam Updated Vital Signs BP (!) 157/94 (BP Location: Right Arm)   Pulse 96   Temp 98.3 F (36.8 C) (Oral)   Resp 16   SpO2 97%   Physical Exam Constitutional:      General: He is not in acute distress.    Appearance: Normal appearance. He is well-developed. He is not ill-appearing or diaphoretic.  HENT:     Head: Normocephalic and atraumatic.     Jaw: There is normal jaw occlusion. No trismus.     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    Mouth/Throat:      Comments: Very poor dentition overall multiple missing teeth with the remaining teeth being severely decayed.  Moderate swelling along the right lower exterior jaw line.  No palpable fluctuance no active drainage.  No gingival erythema  The patient has normal phonation and is in control of secretions. No stridor.  Midline uvula without edema. Soft palate rises symmetrically. No tonsillar erythema, swelling or exudates. Tongue protrusion is normal, floor of mouth is soft. No trismus. No creptius on neck palpation. No gingival erythema or fluctuance noted. Mucus membranes moist. No pallor noted. Eyes:     General: Vision grossly intact. Gaze aligned appropriately.     Pupils: Pupils are equal, round, and reactive to light.  Neck:     Trachea: Trachea and phonation normal.  Pulmonary:     Effort: Pulmonary effort is normal. No respiratory distress.   Abdominal:     General: There is no distension.     Palpations: Abdomen is soft.     Tenderness: There is no abdominal tenderness. There is no guarding or rebound.  Musculoskeletal:        General: Normal range of motion.     Cervical back: Normal range of motion.  Skin:    General: Skin is warm and dry.  Neurological:     Mental Status: He is alert.     GCS: GCS eye subscore is 4. GCS verbal subscore is 5. GCS motor subscore is 6.     Comments: Speech is clear and goal oriented, follows commands Major Cranial nerves without deficit, no facial droop Moves extremities without ataxia, coordination intact  Psychiatric:        Behavior: Behavior normal.     ED  Results / Procedures / Treatments   Labs (all labs ordered are listed, but only abnormal results are displayed) Labs Reviewed - No data to display  EKG None  Radiology No results found.  Procedures Procedures   Medications Ordered in ED Medications  penicillin v potassium (VEETID) tablet 500 mg (has no administration in time range)    ED Course  I have reviewed the triage vital signs and the nursing notes.  Pertinent labs & imaging results that were available during my care of the patient were reviewed by me and considered in my medical decision making (see chart for details).    MDM Rules/Calculators/A&P                         Additional history obtained from: 1. Nursing notes from this visit. 2. Electronic medical records. ------------ 55 year old male presents with right lower dental pain that began yesterday after chewing on a sandwich.  He reports chronic dental pain.  He has very poor dentition with several missing teeth and the remainder being decayed at baseline.  He could not follow-up with his dentist today as it is the weekend.  Patient has a severely decayed tooth which appears to be tooth #28, some mild swelling along the exterior jaw but no gingival erythema or fluctuance to suggest dental  abscess. Patient is well-appearing, afebrile, nontoxic, speaking well.  Patient able to swallow without pain.  No signs of swelling or concern for Ludwig's angina/Peritonsilar abscess/Retropharyngeal abscess or other deep tissue infections.  No sign of swelling of the neck, patient has good range of motion of the neck, no trismus.  Will start patient on penicillin VK 500 mg 4 times daily x7 days and referred to dentist.  Additionally will give patient chlorhexidine mouth rinse and encouraged OTC anti-inflammatory use.  Incidentally patient noted to be hypertensive in the ER today he is asymptomatic regarding his blood pressure.  I encourage patient to take blood pressure medications as prescribed and have blood pressure rechecked by PCP within 1 week and discuss medication management if indicated at that time.  Patient informed of signs/symptoms hypertensive urgency/emergency and to return to the ER for any current  At this time there does not appear to be any evidence of an acute emergency medical condition and the patient appears stable for discharge with appropriate outpatient follow up. Diagnosis was discussed with patient who verbalizes understanding of care plan and is agreeable to discharge. I have discussed return precautions with patient who verbalizes understanding. Patient encouraged to follow-up with their PCP and dentist. All questions answered.   Note: Portions of this report may have been transcribed using voice recognition software. Every effort was made to ensure accuracy; however, inadvertent computerized transcription errors may still be present. Final Clinical Impression(s) / ED Diagnoses Final diagnoses:  Pain due to dental caries  Elevated blood pressure reading    Rx / DC Orders ED Discharge Orders         Ordered    penicillin v potassium (VEETID) 500 MG tablet  4 times daily        12/06/20 1604    chlorhexidine (PERIDEX) 0.12 % solution  2 times daily        12/06/20  [address] 12/06/20 1606    Daleen Bo, MD 12/07/20 1623

## 2020-12-06 NOTE — Discharge Instructions (Addendum)
At this time there does not appear to be the presence of an emergent medical condition, however there is always the potential for conditions to change. Please read and follow the below instructions.  Please return to the Emergency Department immediately for any new or worsening symptoms. Please be sure to follow up with your Primary Care Provider within one week regarding your visit today; please call their office to schedule an appointment even if you are feeling better for a follow-up visit. Please take your antibiotic Penicillin as prescribed until complete to help with your symptoms.  Please drink enough water to avoid dehydration and get plenty of rest. You may use the mouth rinse Peridex as prescribed to help with your symptoms.  Please rinse and spit out Peridex, do not swallow it. Please call the dentist Dr. Haig Prophet on discharge paperwork today to schedule follow-up appointment for definitive dental care. You may use over-the-counter anti-inflammatory such as Tylenol as directed on the packaging to help with your symptoms. Your blood pressure was elevated in the emergency department today.  Please have your blood pressure rechecked by your primary care provider within 1 week and discuss medication management if indicated at that time.  Please take your blood pressure medications as prescribed by your primary care provider.  Go to the nearest Emergency Department immediately if: You have fever or chills You cannot open your mouth. You are having trouble breathing or swallowing. Your facial swelling gets worse You have swelling under your tongue. You have difficulty talking or begin to drool. You have swelling or pain around your neck or under your chin. You have any new/concerning or worsening of symptoms.   Please read the additional information packets attached to your discharge summary.  Do not take your medicine if  develop an itchy rash, swelling in your mouth or lips, or  difficulty breathing; call 911 and seek immediate emergency medical attention if this occurs.  You may review your lab tests and imaging results in their entirety on your MyChart account.  Please discuss all results of fully with your primary care provider and other specialist at your follow-up visit.  Note: Portions of this text may have been transcribed using voice recognition software. Every effort was made to ensure accuracy; however, inadvertent computerized transcription errors may still be present.

## 2021-02-23 ENCOUNTER — Emergency Department (HOSPITAL_COMMUNITY): Payer: Self-pay

## 2021-02-23 ENCOUNTER — Emergency Department (HOSPITAL_COMMUNITY)
Admission: EM | Admit: 2021-02-23 | Discharge: 2021-02-23 | Disposition: A | Discharge status: Home / Self Care | Payer: Self-pay | Attending: Emergency Medicine | Admitting: Emergency Medicine

## 2021-02-23 ENCOUNTER — Encounter (HOSPITAL_COMMUNITY): Payer: Self-pay

## 2021-02-23 ENCOUNTER — Other Ambulatory Visit: Payer: Self-pay

## 2021-02-23 DIAGNOSIS — F17210 Nicotine dependence, cigarettes, uncomplicated: Secondary | ICD-10-CM | POA: Insufficient documentation

## 2021-02-23 DIAGNOSIS — J441 Chronic obstructive pulmonary disease with (acute) exacerbation: Secondary | ICD-10-CM | POA: Insufficient documentation

## 2021-02-23 DIAGNOSIS — R000 Tachycardia, unspecified: Secondary | ICD-10-CM | POA: Insufficient documentation

## 2021-02-23 DIAGNOSIS — Z20822 Contact with and (suspected) exposure to covid-19: Secondary | ICD-10-CM | POA: Insufficient documentation

## 2021-02-23 DIAGNOSIS — I10 Essential (primary) hypertension: Secondary | ICD-10-CM | POA: Insufficient documentation

## 2021-02-23 DIAGNOSIS — Z8616 Personal history of COVID-19: Secondary | ICD-10-CM | POA: Insufficient documentation

## 2021-02-23 DIAGNOSIS — Z79899 Other long term (current) drug therapy: Secondary | ICD-10-CM | POA: Insufficient documentation

## 2021-02-23 DIAGNOSIS — J449 Chronic obstructive pulmonary disease, unspecified: Secondary | ICD-10-CM

## 2021-02-23 LAB — BASIC METABOLIC PANEL
Anion gap: 8 (ref 5–15)
BUN: 9 mg/dL (ref 6–20)
CO2: 28 mmol/L (ref 22–32)
Calcium: 8.9 mg/dL (ref 8.9–10.3)
Chloride: 101 mmol/L (ref 98–111)
Creatinine, Ser: 0.92 mg/dL (ref 0.61–1.24)
GFR, Estimated: >60 mL/min (ref >60)
Glucose, Bld: 107 mg/dL — ABNORMAL HIGH (ref 70–99)
Potassium: 4.3 mmol/L (ref 3.5–5.1)
Sodium: 137 mmol/L (ref 135–145)

## 2021-02-23 LAB — CBC WITH DIFFERENTIAL/PLATELET
Abs Immature Granulocytes: 0.03 10*3/uL (ref 0.00–0.07)
Basophils Absolute: 0.1 10*3/uL (ref 0.0–0.1)
Basophils Relative: 1 %
Eosinophils Absolute: 0 10*3/uL (ref 0.0–0.5)
Eosinophils Relative: 1 %
HCT: 52.5 % — ABNORMAL HIGH (ref 39.0–52.0)
Hemoglobin: 16.9 g/dL (ref 13.0–17.0)
Immature Granulocytes: 1 %
Lymphocytes Relative: 18 %
Lymphs Abs: 1.1 10*3/uL (ref 0.7–4.0)
MCH: 28 pg (ref 26.0–34.0)
MCHC: 32.2 g/dL (ref 30.0–36.0)
MCV: 86.9 fL (ref 80.0–100.0)
Monocytes Absolute: 0.8 10*3/uL (ref 0.1–1.0)
Monocytes Relative: 13 %
Neutro Abs: 4.1 10*3/uL (ref 1.7–7.7)
Neutrophils Relative %: 66 %
Platelets: 233 10*3/uL (ref 150–400)
RBC: 6.04 MIL/uL — ABNORMAL HIGH (ref 4.22–5.81)
RDW: 13 % (ref 11.5–15.5)
WBC: 6.1 10*3/uL (ref 4.0–10.5)
nRBC: 0 % (ref 0.0–0.2)

## 2021-02-23 LAB — SARS CORONAVIRUS 2 (TAT 6-24 HRS): SARS Coronavirus 2: NEGATIVE

## 2021-02-23 MED ORDER — PREDNISONE 20 MG PO TABS: 40.0000 mg | ORAL_TABLET | Freq: Every day | ORAL | 0 refills | Status: AC

## 2021-02-23 MED ORDER — PREDNISONE 20 MG PO TABS
60.0000 mg | ORAL_TABLET | Freq: Once | ORAL | Status: AC
  Administered 2021-02-23: 60 mg via ORAL
  Filled 2021-02-23: qty 3

## 2021-02-23 MED ORDER — ALBUTEROL SULFATE HFA 108 (90 BASE) MCG/ACT IN AERS
2.0000 | INHALATION_SPRAY | RESPIRATORY_TRACT | Status: DC | PRN
  Administered 2021-02-23: 2 via RESPIRATORY_TRACT
  Filled 2021-02-23: qty 6.7

## 2021-02-23 MED ORDER — ALBUTEROL SULFATE HFA 108 (90 BASE) MCG/ACT IN AERS: 2.0000 | INHALATION_SPRAY | RESPIRATORY_TRACT | 2 refills | Status: DC | PRN

## 2021-02-23 NOTE — ED Provider Notes (Signed)
I saw and evaluated the patient, reviewed the resident's note and I agree with the findings and plan.  EKG Interpretation  Date/Time:  Tuesday Feb 23 2021 12:30:55 EDT Ventricular Rate:  86 PR Interval:  129 QRS Duration: 94 QT Interval:  347 QTC Calculation: 415 R Axis:   93 Text Interpretation: Sinus rhythm Borderline right axis deviation 12 Lead; Mason-Likar Confirmed by Lacretia Leigh ([id number]) on 02/23/2021 4:4:24 PM  55 year old male who presents with shortness of breath.  Patient ran out of his inhaler.  He has history of COPD and continues to smoke cigarettes.  Wheezing noted on exam.  Will treat with albuterol and prednisone and likely discharge home   Lacretia Leigh, MD 02/23/21 [phone]

## 2021-02-23 NOTE — ED Provider Notes (Signed)
Emergency Medicine Provider Triage Evaluation Note  Ray Griffin , a 55 y.o. male  was evaluated in triage.  Pt complains of shortness of breath and cough for the past 2 days.  Reports productive cough.  Denies any chest pain, fever, vomiting.  Has been feeling "sick to my stomach."  Has a history of COPD but no home medications or inhalers.  Review of Systems  Positive: Shortness of breath, cough Negative: Fever, chest pain  Physical Exam  BP (!) 186/99 (BP Location: Left Arm)   Pulse 84   Temp 98.5 F (36.9 C) (Oral)   Resp (!) 25   Ht 6\' 1"  (1.854 m)   Wt 117.9 kg   SpO2 98%   BMI 34.30 kg/m  Gen:   Awake, no distress   Resp:  Tachypneic MSK:   Moves extremities without difficulty  Other:  No wheezing noted bilaterally  Medical Decision Making  Medically screening exam initiated at 12:50 PM.  Appropriate orders placed.  CORDELRO GAUTREAU was informed that the remainder of the evaluation will be completed by another provider, this initial triage assessment does not replace that evaluation, and the importance of remaining in the ED until their evaluation is complete.  Will obtain lab work in addition to chest x-ray   Delia Heady, Hershal Coria 02/23/21 [place]    Davonna Belling, MD 02/23/21 [phone]

## 2021-02-23 NOTE — Discharge Instructions (Addendum)
Take prednisone 40 mg daily for 5 days. Take albuterol inhaler every 4 hours as needed for shortness of breath and wheezing.

## 2021-02-23 NOTE — ED Provider Notes (Signed)
[place] DEPT Provider Note   CSN: [id number] Arrival date & time: 02/23/21  1211    History Chief Complaint  Patient presents with  . Shortness of Breath    Ray Griffin is a 55 y.o. male with PMH of COPD and hypertension here for evaluation of shortness of breath.  Patient states he has had shortness of breath for the past few days with associated productive cough of clear sputum.  Patient states when he coughs out sputum his breathing improves.  He has had difficulty sleeping because of the shortness of breath and cough at night.  He endorses occasional chills and chest discomfort when he coughs but denies any fever, chest pain, palpitations, dizziness, headaches, back pain or leg swelling.  Of note, patient states his nebulizer machine broke a year ago and he has not had inhaler since last year.    Past Medical History:  Diagnosis Date  . Anxiety   . COPD (chronic obstructive pulmonary disease) ([place])   . Emphysema   . Hypertension   . Tachycardia     Patient Active Problem List   Diagnosis Date Noted  . COVID-19 10/30/2020  . Pulmonary nodules/lesions, multiple 10/01/2017  . COPD exacerbation ([place]) 10/01/2017  . Acute cholecystitis 12/04/2014  . COPD with acute exacerbation ([place]) 12/04/2014  . Gallstones 12/03/2014  . SINUSITIS - ACUTE-NOS 10/17/2009  . Cigarette smoker 01/08/2008  . PANIC ATTACK, ACUTE 06/18/2007  . ALLERGIC RHINITIS 06/18/2007  . COPD GOLD III/ still smoking 06/18/2007  . DEPRESSION 04/04/2007  . Essential hypertension 04/04/2007    Past Surgical History:  Procedure Laterality Date  . CHOLECYSTECTOMY N/A 12/04/2014   Procedure: LAPAROSCOPIC CHOLECYSTECTOMY WITH POSSIBLE INTRAOPERATIVE CHOLANGIOGRAM;  Surgeon: Gayland Curry, MD;  Location: [facility];  Service: General;  Laterality: N/A;  . FACIAL COSMETIC SURGERY    . THORACOTOMY         Family History  Problem Relation Age of Onset  . Diabetes Mother   .  Heart failure Mother   . Diabetes Father   . Heart failure Father   . Emphysema Father        smoked  . Bipolar disorder Brother   . Thyroid cancer Maternal Grandmother     Social History   Tobacco Use  . Smoking status: Current Every Day Smoker    Packs/day: 1.00    Years: 30.00    Pack years: 30.00    Types: Cigarettes  . Smokeless tobacco: Never Used  Vaping Use  . Vaping Use: Never used  Substance Use Topics  . Alcohol use: No    Comment: recently quit 3 mths ago  . Drug use: No    Home Medications Prior to Admission medications   Medication Sig Start Date End Date Taking? Authorizing Provider  albuterol (VENTOLIN HFA) 108 (90 Base) MCG/ACT inhaler Inhale 2 puffs into the lungs every 4 (four) hours as needed for wheezing or shortness of breath. Patient not taking: Reported on 10/30/2020 09/14/19   Gildardo Pounds, NP  azithromycin (ZITHROMAX) 250 MG tablet Take 2 tablets (500 mg) on day 1, then take 1 tablet (250 mg) on days 2-5 10/30/20   Fenton Foy, NP  budesonide-formoterol [facility]) 160-4.5 MCG/ACT inhaler Inhale 2 puffs into the lungs 2 (two) times daily. Patient not taking: Reported on 10/30/2020 10/17/17   Tanda Rockers, MD  chlorhexidine (PERIDEX) 0.12 % solution Use as directed 15 mLs in the mouth or throat 2 (two) times daily. 12/06/20  Nuala Alpha A, PA-C  hydrochlorothiazide (HYDRODIURIL) 25 MG tablet Take 1 tablet (25 mg total) by mouth daily. 10/08/20   Suzy Bouchard, PA-C  predniSONE (DELTASONE) 10 MG tablet Take 4 tabs for 2 days, then 3 tabs for 2 days, then 2 tabs for 2 days, then 1 tab for 2 days, then stop 10/30/20   Fenton Foy, NP  Respiratory Therapy Supplies (NEBULIZER) DEVI Please provide patient with nebulizer for personal home use. Patient not taking: No sig reported 09/14/19   Gildardo Pounds, NP    Allergies    Patient has no known allergies.  Review of Systems   Review of Systems  Constitutional: Positive for chills  and fatigue. Negative for activity change and fever.  HENT: Positive for congestion. Negative for sinus pressure.   Eyes: Negative for visual disturbance.  Respiratory: Positive for cough, shortness of breath and wheezing.   Gastrointestinal: Negative for abdominal pain, nausea and vomiting.  Musculoskeletal: Negative for back pain.  Neurological: Negative for dizziness and headaches.    Physical Exam Updated Vital Signs BP (!) 185/100   Pulse 88   Temp 98.5 F (36.9 C) (Oral)   Resp (!) 26   Ht 6\' 1"  (1.854 m)   Wt 117.9 kg   SpO2 97%   BMI 34.30 kg/m   Physical Exam Constitutional:      General: He is not in acute distress.    Appearance: He is well-developed. He is obese.  HENT:     Head: Normocephalic and atraumatic.  Eyes:     Extraocular Movements: Extraocular movements intact.  Cardiovascular:     Rate and Rhythm: Regular rhythm. Tachycardia present.     Pulses: Normal pulses.     Heart sounds: Normal heart sounds.  Pulmonary:     Effort: Pulmonary effort is normal. Tachypnea present. No respiratory distress.     Breath sounds: No decreased breath sounds.     Comments: Mild expiratory wheezes throughout lung fields.  Mild congestion in the upper lung bases.  No rales. Chest:     Chest wall: No tenderness.  Abdominal:     General: Bowel sounds are normal.     Palpations: Abdomen is soft.  Musculoskeletal:        General: Normal range of motion.     Cervical back: Normal range of motion and neck supple.  Skin:    General: Skin is warm and dry.  Neurological:     General: No focal deficit present.     Mental Status: He is alert and oriented to person, place, and time.  Psychiatric:        Mood and Affect: Mood normal.     ED Results / Procedures / Treatments   Labs (all labs ordered are listed, but only abnormal results are displayed) Labs Reviewed  BASIC METABOLIC PANEL - Abnormal; Notable for the following components:      Result Value   Glucose,  Bld 107 (*)    All other components within normal limits  CBC WITH DIFFERENTIAL/PLATELET - Abnormal; Notable for the following components:   RBC 6.04 (*)    HCT 52.5 (*)    All other components within normal limits    EKG EKG Interpretation  Date/Time:  Tuesday Feb 23 2021 12:30:55 EDT Ventricular Rate:  86 PR Interval:  129 QRS Duration: 94 QT Interval:  347 QTC Calculation: 415 R Axis:   93 Text Interpretation: Sinus rhythm Borderline right axis deviation 12 Lead; Mason-Likar Confirmed by  Lacretia Leigh ([id number]) on 02/23/2021 4:22:09 PM   Radiology DG Chest 2 View  Result Date: 02/23/2021 CLINICAL DATA:  Shortness of breath, history of pneumonia and emphysema EXAM: CHEST - 2 VIEW COMPARISON:  10/20/2020 FINDINGS: Right apical and chronic bibasilar pleuroparenchymal scarring without significant interval change. Stable heart size and vascularity. No developing large effusion or pneumothorax. Trachea midline. Degenerative changes of the spine. IMPRESSION: Chronic right apical and bibasilar scarring. No interval change or acute process by plain radiography. Electronically Signed   By: Jerilynn Mages.  Shick M.D.   On: 02/23/2021 13:50    Procedures Procedures   Medications Ordered in ED Medications  albuterol (VENTOLIN HFA) 108 (90 Base) MCG/ACT inhaler 2 puff (has no administration in time range)  predniSONE (DELTASONE) tablet 60 mg (has no administration in time range)    ED Course  I have reviewed the triage vital signs and the nursing notes.  Pertinent labs & imaging results that were available during my care of the patient were reviewed by me and considered in my medical decision making (see chart for details).    MDM Rules/Calculators/A&P                          55 year old gentleman with a history of COPD and hypertension here for evaluation of progressive shortness of breath with associated chills, wheezing and congestion.  Patient has not had nebulized treatment and inhaler for  the last year.  Patient found to be tachypneic and tachycardic but O2 sats above 95% on room air.  On exam, patient has mild expiratory wheezes throughout lung fields with mild congestion in the upper bases.  No respiratory distress.  BMP unremarkable.  No leukocytosis on CBC. Patient remains afebrile.  Chest x-ray showed chronic right apical and bibasilar scarring but no acute cardiopulmonary disease. Symptoms consistent with COPD exacerbation.    Patient given 2 puffs of albuterol inhaler and 60 mg prednisone p.o. with significant improvement in symptoms. Patient discharged home with 5 days of prednisone therapy and refill on his albuterol inhaler. Patient given information to establish care with [facility] wellness for a primary care provider. Patient will need adjustment of antihypertensive regimen for better blood pressure control. Patient will also need to be assessed for sleep apnea.   Final Clinical Impression(s) / ED Diagnoses Final diagnoses:  COPD exacerbation ([place])    Rx / DC Orders ED Discharge Orders         Ordered    albuterol (VENTOLIN HFA) 108 (90 Base) MCG/ACT inhaler  Every 4 hours PRN        02/23/21 1727    predniSONE (DELTASONE) 20 MG tablet  Daily with breakfast        02/23/21 1727           Lacinda Axon, MD 02/23/21 1925    Lacretia Leigh, MD 02/26/21 1004

## 2021-02-23 NOTE — ED Triage Notes (Signed)
Patient states that he has been having SOB x 2 days. Patient stated, "I have this congestion in my left lung and when I clear it out, I can breathe better." Patient reports a history of pneumonia. Patient states SOB is worse when lying flat.

## 2021-06-15 ENCOUNTER — Emergency Department (HOSPITAL_COMMUNITY): Payer: Self-pay

## 2021-06-15 ENCOUNTER — Emergency Department (HOSPITAL_COMMUNITY)
Admission: EM | Admit: 2021-06-15 | Discharge: 2021-06-15 | Discharge status: Against Medical Advice | Payer: Self-pay | Attending: Emergency Medicine | Admitting: Emergency Medicine

## 2021-06-15 ENCOUNTER — Other Ambulatory Visit: Payer: Self-pay

## 2021-06-15 DIAGNOSIS — J441 Chronic obstructive pulmonary disease with (acute) exacerbation: Secondary | ICD-10-CM | POA: Insufficient documentation

## 2021-06-15 DIAGNOSIS — R5383 Other fatigue: Secondary | ICD-10-CM | POA: Insufficient documentation

## 2021-06-15 DIAGNOSIS — Z79899 Other long term (current) drug therapy: Secondary | ICD-10-CM | POA: Insufficient documentation

## 2021-06-15 DIAGNOSIS — R0602 Shortness of breath: Secondary | ICD-10-CM | POA: Insufficient documentation

## 2021-06-15 DIAGNOSIS — I10 Essential (primary) hypertension: Secondary | ICD-10-CM | POA: Insufficient documentation

## 2021-06-15 DIAGNOSIS — R531 Weakness: Secondary | ICD-10-CM | POA: Insufficient documentation

## 2021-06-15 DIAGNOSIS — F17210 Nicotine dependence, cigarettes, uncomplicated: Secondary | ICD-10-CM | POA: Insufficient documentation

## 2021-06-15 DIAGNOSIS — Z8616 Personal history of COVID-19: Secondary | ICD-10-CM | POA: Insufficient documentation

## 2021-06-15 DIAGNOSIS — Z20822 Contact with and (suspected) exposure to covid-19: Secondary | ICD-10-CM | POA: Insufficient documentation

## 2021-06-15 LAB — BASIC METABOLIC PANEL
Anion gap: 9 (ref 5–15)
BUN: 8 mg/dL (ref 6–20)
CO2: 29 mmol/L (ref 22–32)
Calcium: 9.6 mg/dL (ref 8.9–10.3)
Chloride: 100 mmol/L (ref 98–111)
Creatinine, Ser: 0.62 mg/dL (ref 0.61–1.24)
GFR, Estimated: >60 mL/min (ref >60)
Glucose, Bld: 99 mg/dL (ref 70–99)
Potassium: 4 mmol/L (ref 3.5–5.1)
Sodium: 138 mmol/L (ref 135–145)

## 2021-06-15 LAB — CBC
HCT: 53.3 % — ABNORMAL HIGH (ref 39.0–52.0)
Hemoglobin: 17.3 g/dL — ABNORMAL HIGH (ref 13.0–17.0)
MCH: 28.5 pg (ref 26.0–34.0)
MCHC: 32.5 g/dL (ref 30.0–36.0)
MCV: 87.8 fL (ref 80.0–100.0)
Platelets: 349 10*3/uL (ref 150–400)
RBC: 6.07 MIL/uL — ABNORMAL HIGH (ref 4.22–5.81)
RDW: 13.2 % (ref 11.5–15.5)
WBC: 14.7 10*3/uL — ABNORMAL HIGH (ref 4.0–10.5)
nRBC: 0 % (ref 0.0–0.2)

## 2021-06-15 LAB — HEPATIC FUNCTION PANEL
ALT: 46 U/L — ABNORMAL HIGH (ref 0–44)
AST: 30 U/L (ref 15–41)
Albumin: 4.1 g/dL (ref 3.5–5.0)
Alkaline Phosphatase: 110 U/L (ref 38–126)
Bilirubin, Direct: 0.2 mg/dL (ref 0.0–0.2)
Indirect Bilirubin: 0.6 mg/dL (ref 0.3–0.9)
Total Bilirubin: 0.8 mg/dL (ref 0.3–1.2)
Total Protein: 8 g/dL (ref 6.5–8.1)

## 2021-06-15 LAB — TROPONIN I (HIGH SENSITIVITY): Troponin I (High Sensitivity): 4 ng/L (ref <18)

## 2021-06-15 LAB — CBG MONITORING, ED: Glucose-Capillary: 105 mg/dL — ABNORMAL HIGH (ref 70–99)

## 2021-06-15 LAB — RESP PANEL BY RT-PCR (FLU A&B, COVID) ARPGX2
Influenza A by PCR: NEGATIVE
Influenza B by PCR: NEGATIVE
SARS Coronavirus 2 by RT PCR: NEGATIVE

## 2021-06-15 LAB — LIPASE, BLOOD: Lipase: 29 U/L (ref 11–51)

## 2021-06-15 NOTE — ED Provider Notes (Signed)
Emergency Medicine Provider Triage Evaluation Note  Ray Griffin , a 55 y.o. male  was evaluated in triage.  Generalized fatigue onset yesterday while at work.  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  Patient went to [facility] and had his blood pressure taken noted to be hypertensive with systolic around [id number].  Patient went home and took a blood pressure pill and went to sleep.  Patient reports this morning when he woke up he is still feeling generally weak and tired.  This is associated with a mild shortness of breath.  Additionally patient reports hemorrhoids for the past 1 week, he has been using cream with relief.  Review of Systems  Positive: Generalized weakness, fatigue, shortness of breath Negative: Chest pain, fall/injury, fever/chills, cough/hemoptysis, unilateral weakness, numbness/tingling  Physical Exam  BP (!) 168/75 (BP Location: Right Arm)   Pulse 83   Temp 98.3 F (36.8 C) (Oral)   Resp 18   SpO2 99%  Gen:   Awake, no distress   Resp:  Normal effort  MSK:   Moves extremities without difficulty  Other:  Cranial nerves intact.  Negative pronator drift.  Negative Romberg.  Steady  unassisted gait  Medical Decision Making  Medically screening exam initiated at 10:49 AM.  Appropriate orders placed.  CERGIO LANDAU was informed that the remainder of the evaluation will be completed by another provider, this initial triage assessment does not replace that evaluation, and the importance of remaining in the ED until their evaluation is complete.    Note: Portions of this report may have been transcribed using voice recognition software. Every effort was made to ensure accuracy; however, inadvertent computerized transcription errors may still be present.    Gari Crown 06/15/21 1053    Regan Lemming, MD 06/15/21 1359

## 2021-06-15 NOTE — ED Notes (Addendum)
During patient assessment, pt stating "I'm not doing the IV I'm getting out of here". Pt encouraged to remain in ED for workup, and discussed option of not doing IV and repeat troponin if okay'd by the MD. Pt non-cooperative with assessment questions. Pt began cursing at me stating "I don't like your shitty fucking attitude". I apologized to patient and attempted redirection. Pt took off monitoring equipment and refused to sign AMA form. MD and charge RN notified and discussed with patient. Pt discharged AMA accordingly

## 2021-06-15 NOTE — ED Provider Notes (Signed)
[facility] DEPT Provider Note   CSN: [id number] Arrival date & time: 06/15/21  1006     History Chief Complaint  Patient presents with   Fatigue   Shortness of Breath    Ray Griffin is a 55 y.o. male.  HPI    Pt had come in with cc of shortness of breath and weakness. He had MSE completed and as I was walking in to the room, patient was just walking out. I asked the RN if patient was heading to the bathroom - and he reported that pt was leaving.  I asked patient if I can help him and apologized for the wait. Patient stopped and informed me that he was leaving because of the RN's attitude. I informed him that I would be happy to listen to his concerns and we can request nurse change if he desired. Pt responded no. I asked him if we can discuss the results of the workup that was completed - and he said he wasn't interested in it. Another staff member at this time came in and we tried to redirect patient to his room - but it was hard to engage him any further.  Past Medical History:  Diagnosis Date   Anxiety    COPD (chronic obstructive pulmonary disease) ([place])    Emphysema    Hypertension    Tachycardia     Patient Active Problem List   Diagnosis Date Noted   COVID-19 10/30/2020   Pulmonary nodules/lesions, multiple 10/01/2017   COPD exacerbation ([place]) 10/01/2017   Acute cholecystitis 12/04/2014   COPD with acute exacerbation ([place]) 12/04/2014   Gallstones 12/03/2014   SINUSITIS - ACUTE-NOS 10/17/2009   Cigarette smoker 01/08/2008   PANIC ATTACK, ACUTE 06/18/2007   ALLERGIC RHINITIS 06/18/2007   COPD GOLD III/ still smoking 06/18/2007   DEPRESSION 04/04/2007   Essential hypertension 04/04/2007    Past Surgical History:  Procedure Laterality Date   CHOLECYSTECTOMY N/A 12/04/2014   Procedure: LAPAROSCOPIC CHOLECYSTECTOMY WITH POSSIBLE INTRAOPERATIVE CHOLANGIOGRAM;  Surgeon: Gayland Curry, MD;  Location: [facility];  Service: General;   Laterality: N/A;   FACIAL COSMETIC SURGERY     THORACOTOMY         Family History  Problem Relation Age of Onset   Diabetes Mother    Heart failure Mother    Diabetes Father    Heart failure Father    Emphysema Father        smoked   Bipolar disorder Brother    Thyroid cancer Maternal Grandmother     Social History   Tobacco Use   Smoking status: Every Day    Packs/day: 1.00    Years: 30.00    Pack years: 30.00    Types: Cigarettes   Smokeless tobacco: Never  Vaping Use   Vaping Use: Never used  Substance Use Topics   Alcohol use: No    Comment: recently quit 3 mths ago   Drug use: No    Home Medications Prior to Admission medications   Medication Sig Start Date End Date Taking? Authorizing Provider  albuterol (VENTOLIN HFA) 108 (90 Base) MCG/ACT inhaler Inhale 2 puffs into the lungs every 4 (four) hours as needed for wheezing or shortness of breath. 02/23/21   Lacinda Axon, MD  azithromycin (ZITHROMAX) 250 MG tablet Take 2 tablets (500 mg) on day 1, then take 1 tablet (250 mg) on days 2-5 10/30/20   Fenton Foy, NP  budesonide-formoterol (SYMBICORT) 160-4.5 MCG/ACT inhaler Inhale  2 puffs into the lungs 2 (two) times daily. Patient not taking: Reported on 10/30/2020 10/17/17   Tanda Rockers, MD  chlorhexidine (PERIDEX) 0.12 % solution Use as directed 15 mLs in the mouth or throat 2 (two) times daily. 12/06/20   Nuala Alpha A, PA-C  hydrochlorothiazide (HYDRODIURIL) 25 MG tablet Take 1 tablet (25 mg total) by mouth daily. 10/08/20   Suzy Bouchard, PA-C  Respiratory Therapy Supplies (NEBULIZER) DEVI Please provide patient with nebulizer for personal home use. Patient not taking: No sig reported 09/14/19   Gildardo Pounds, NP    Allergies    Patient has no known allergies.  Review of Systems   Review of Systems  Unable to perform ROS: Other   Physical Exam Updated Vital Signs BP (!) 136/95   Pulse 74   Temp 98.3 F (36.8 C) (Oral)   Resp 18    SpO2 100%   Physical Exam Vitals and nursing note reviewed.  HENT:     Head: Normocephalic and atraumatic.  Pulmonary:     Effort: Pulmonary effort is normal.  Neurological:     Mental Status: He is alert and oriented to person, place, and time.    ED Results / Procedures / Treatments   Labs (all labs ordered are listed, but only abnormal results are displayed) Labs Reviewed  CBC - Abnormal; Notable for the following components:      Result Value   WBC 14.7 (*)    RBC 6.07 (*)    Hemoglobin 17.3 (*)    HCT 53.3 (*)    All other components within normal limits  HEPATIC FUNCTION PANEL - Abnormal; Notable for the following components:   ALT 46 (*)    All other components within normal limits  CBG MONITORING, ED - Abnormal; Notable for the following components:   Glucose-Capillary 105 (*)    All other components within normal limits  RESP PANEL BY RT-PCR (FLU A&B, COVID) ARPGX2  BASIC METABOLIC PANEL  LIPASE, BLOOD  TROPONIN I (HIGH SENSITIVITY)  TROPONIN I (HIGH SENSITIVITY)    EKG None  Radiology DG Chest 1 View  Result Date: 06/15/2021 CLINICAL DATA:  Fatigue, COPD. EXAM: CHEST  1 VIEW COMPARISON:  Feb 23, 2021. FINDINGS: Stable cardiomediastinal silhouette. Stable right apical scarring is noted. Stable bibasilar scarring is noted. Pneumothorax is noted. No definite acute abnormality is noted. IMPRESSION: Stable bilateral lung scarring is noted. No significant change compared to prior exam. Electronically Signed   By: Marijo Conception M.D.   On: 06/15/2021 12:48    Procedures Procedures   Medications Ordered in ED Medications - No data to display  ED Course  I have reviewed the triage vital signs and the nursing notes.  Pertinent labs & imaging results that were available during my care of the patient were reviewed by me and considered in my medical decision making (see chart for details).    MDM Rules/Calculators/A&P                           Pt had comes  in w/ weakness and fatigue. I had checked patients workup and he has mild leukocytosis with normal-appearing electrolytes.  Normal creatinine.  Hemoglobin was 17.3, likely hemoconcentration.  Cardiac enzymes were normal.  Chest x-ray was reassuring.  He has history of COPD and appears that he had COVID-19 in January.  Initial thought was that patient could be having viral illness including COVID-19 or COPD  exacerbation.  Unfortunately patient had a negative experience prior to my assessment and wanted to just leave.  We try to redirect him back to his room to complete the care, and discuss his results.  Attempts were unsuccessful.    Patient wants to leave AMA. Attempted to the best of my capacity to have him stay and complete the work-up with no avail, including even asking the charge to talk to him and change the nurse staff if needed.  Final Clinical Impression(s) / ED Diagnoses Final diagnoses:  Fatigue    Rx / DC Orders ED Discharge Orders     None        Varney Biles, MD 06/15/21 [phone]

## 2021-06-16 ENCOUNTER — Encounter: Payer: Self-pay | Admitting: Nurse Practitioner

## 2021-06-16 ENCOUNTER — Ambulatory Visit (INDEPENDENT_AMBULATORY_CARE_PROVIDER_SITE_OTHER): Payer: Self-pay | Admitting: Nurse Practitioner

## 2021-06-16 ENCOUNTER — Ambulatory Visit: Payer: Self-pay | Admitting: *Deleted

## 2021-06-16 ENCOUNTER — Telehealth: Payer: Self-pay

## 2021-06-16 DIAGNOSIS — D72829 Elevated white blood cell count, unspecified: Secondary | ICD-10-CM

## 2021-06-16 NOTE — Telephone Encounter (Signed)
Seen in ED yesterday.  This morning my heart rate is fast.   I came in with a hemorrhoid but they changed it to shortness of breath. I looked at [facility] and there is a lot of red flags on it.  I had a problem with one of the nurses.  I instructed him to call the ED where he had the problem with the nurse and talk with whoever was in charge.     They did a blood test and an EKG.   They never told me what was wrong.   I'm going to go to work today and see how I do. If I'm not feeling better I'll return to the emergency room.  I asked him if he had a PCP he does not so I instructed him to return to the ED if his symptoms continue.   He was in agreement to this plan.   He said he went to [facility].  He thanked me and hung up.

## 2021-06-16 NOTE — Progress Notes (Signed)
Pt presents for telemedicine visit for lab results, pt reports he left ED yesterday but want to make sure he is not in danger due to labs

## 2021-06-16 NOTE — Progress Notes (Signed)
Virtual Visit via Telephone Note  I connected with Ray Griffin on 06/16/21 at 11:10 AM EDT by telephone and verified that I am speaking with the correct person using two identifiers.  Location: Patient: home Provider: office   I discussed the limitations, risks, security and privacy concerns of performing an evaluation and management service by telephone and the availability of in person appointments. I also discussed with the patient that there may be a patient responsible charge related to this service. The patient expressed understanding and agreed to proceed.   History of Present Illness:  Patient presents today for ED follow-up.  Patient was seen in the ED yesterday but left AMA.  Patient presented with fatigue and weakness.  Did have lab work, EKG, chest x-ray which were overall normal hemoglobin was 17.3 and was noted to be likely due to hemoconcentration.  Patient wanted to follow-up today to see what his lab results revealed.  We will recheck CBC tomorrow.  Patient states that overall he is improved and is at work today. Denies f/c/s, n/v/d, hemoptysis, PND, chest pain or edema.     Observations/Objective:  Vitals with BMI 06/15/2021 06/15/2021 06/15/2021  Height - - -  Weight - - -  BMI - - -  Systolic - - 0000000  Diastolic - - 93  Pulse 75 76 -      Assessment and Plan:  Fatigue and Weakness:  Discussed lab results  Stay active  Stay well hydrated  Will recheck labs tomorrow.  Follow up:  Follow up to establish care with new PCP    I discussed the assessment and treatment plan with the patient. The patient was provided an opportunity to ask questions and all were answered. The patient agreed with the plan and demonstrated an understanding of the instructions.   The patient was advised to call back or seek an in-person evaluation if the symptoms worsen or if the condition fails to improve as anticipated.  I provided 23 minutes of non-face-to-face time during  this encounter.   Fenton Foy, NP

## 2021-06-16 NOTE — Telephone Encounter (Signed)
Pt called in on the community line.   He was seen in the ED yesterday.   This morning he is still feeling like his heart rate is elevated.  He does not have a PCP so I referred him back to the ED for continuing symptoms.   "I'm going to go to work first and see how I do".   "If I'm still not better after a while I'll go back".

## 2021-06-16 NOTE — Telephone Encounter (Signed)
..  Mr. Ray Griffin, Ray Griffin are scheduled for a virtual visit with your provider today.    Just as we do with appointments in the office, we must obtain your consent to participate.  Your consent will be active for this visit and any virtual visit you may have with one of our providers in the next 365 days.    If you have a MyChart account, I can also send a copy of this consent to you electronically.  All virtual visits are billed to your insurance company just like a traditional visit in the office.  As this is a virtual visit, video technology does not allow for your provider to perform a traditional examination.  This may limit your provider's ability to fully assess your condition.  If your provider identifies any concerns that need to be evaluated in person or the need to arrange testing such as labs, EKG, etc, we will make arrangements to do so.    Although advances in technology are sophisticated, we cannot ensure that it will always work on either your end or our end.  If the connection with a video visit is poor, we may have to switch to a telephone visit.  With either a video or telephone visit, we are not always able to ensure that we have a secure connection.   I need to obtain your verbal consent now.   Are you willing to proceed with your visit today?   Ray Griffin has provided verbal consent on 06/16/2021 for a virtual visit (video or telephone).   Elmon Else, [facility] 06/16/2021  11:17 AM

## 2021-06-16 NOTE — Patient Instructions (Signed)
Fatigue and Weakness:  Discussed lab results  Stay active  Stay well hydrated  Will recheck labs tomorrow.  Follow up:  Follow up to establish care with new PCP

## 2021-06-17 ENCOUNTER — Other Ambulatory Visit: Payer: Self-pay

## 2021-06-17 DIAGNOSIS — D72829 Elevated white blood cell count, unspecified: Secondary | ICD-10-CM

## 2021-06-17 NOTE — Progress Notes (Signed)
Labs only

## 2021-06-18 LAB — CBC
Hematocrit: 51.2 % — ABNORMAL HIGH (ref 37.5–51.0)
Hemoglobin: 17.2 g/dL (ref 13.0–17.7)
MCH: 28.3 pg (ref 26.6–33.0)
MCHC: 33.6 g/dL (ref 31.5–35.7)
MCV: 84 fL (ref 79–97)
Platelets: 352 10*3/uL (ref 150–450)
RBC: 6.08 x10E6/uL — ABNORMAL HIGH (ref 4.14–5.80)
RDW: 12.9 % (ref 11.6–15.4)
WBC: 14.4 10*3/uL — ABNORMAL HIGH (ref 3.4–10.8)

## 2021-06-21 ENCOUNTER — Telehealth (HOSPITAL_COMMUNITY): Payer: Self-pay

## 2021-06-21 NOTE — Telephone Encounter (Signed)
Went out for an EMS call today to Ray Griffin where he was complaining of dizziness with rectum pain, and diarrhea over the last few days with intermittent constipation. Ray Griffin states he was seen in the ED last week for similar symptoms but was referred to PCP. He states he is seeing Dr. Redmond Pulling for his first visit tomorrow. Vitals were obtained- BP sitting- 140/82  BP standing- 120 palpated HR- 106    Ray Griffin complained of increase in dizziness upon standing or sudden movements, he states he has not been eating or drinking due to fear of pain during BM's. I encouraged Ray Griffin to drink plenty of fluids and eat something light to assist in re-hydration. He was prompted on transport to the ER and refused stating he wanted to wait to see his new doctor tomorrow. I advised him I would relay this message to Dr. Redmond Pulling.   Ray Griffin was encouraged to rest over in the night and his brother and friends were present and advised they would keep an eye on him and call EMS again if needed. Ray Griffin agreed with plan and will see PCP tomorrow.     Ray Griffin [employer] [phone]

## 2021-06-22 ENCOUNTER — Other Ambulatory Visit: Payer: Self-pay

## 2021-06-22 ENCOUNTER — Encounter: Payer: Self-pay | Admitting: Family Medicine

## 2021-06-22 ENCOUNTER — Ambulatory Visit (INDEPENDENT_AMBULATORY_CARE_PROVIDER_SITE_OTHER): Payer: Self-pay | Admitting: Family Medicine

## 2021-06-22 VITALS — BP 147/76 | HR 97 | Temp 97.9°F | Resp 16 | Wt 230.2 lb

## 2021-06-22 DIAGNOSIS — R5381 Other malaise: Secondary | ICD-10-CM

## 2021-06-22 DIAGNOSIS — Z7689 Persons encountering health services in other specified circumstances: Secondary | ICD-10-CM

## 2021-06-22 DIAGNOSIS — R5383 Other fatigue: Secondary | ICD-10-CM

## 2021-06-22 DIAGNOSIS — I10 Essential (primary) hypertension: Secondary | ICD-10-CM

## 2021-06-22 DIAGNOSIS — R197 Diarrhea, unspecified: Secondary | ICD-10-CM

## 2021-06-22 NOTE — Progress Notes (Signed)
Patient c/o uncontrollable diarrhea, feeling fatigue, blood in stools, possible hemorrhoids  Patient has been coughing up yellow mucus  Patient called EMS from feeling so bad, patient said he was treated with bolus bag Patient really concerned about what's going on with him

## 2021-06-23 NOTE — Progress Notes (Signed)
New Patient Office Visit  Subjective:  Patient ID: Ray Griffin, male    DOB: 31-Dec-1965  Age: 55 y.o. MRN: [id number]  CC:  Chief Complaint  Patient presents with   Cough   Diarrhea   Fatigue    HPI GIANKARLO LEAMER presents for to establish care. Patient reports that he has feelings of general malaise with various symptoms including dizziness, diarrhea, abdominal pain. Patient was seen at ED a couple of days ago but did not finish the evaluation. Patient was also seen by EMS and given fluids. Patient reports that he believes something is going that he believes may be serious.  Past Medical History:  Diagnosis Date   Anxiety    COPD (chronic obstructive pulmonary disease) ([facility])    Emphysema    Hypertension    Tachycardia     Past Surgical History:  Procedure Laterality Date   CHOLECYSTECTOMY N/A 12/04/2014   Procedure: LAPAROSCOPIC CHOLECYSTECTOMY WITH POSSIBLE INTRAOPERATIVE CHOLANGIOGRAM;  Surgeon: Gayland Curry, MD;  Location: [facility];  Service: General;  Laterality: N/A;   FACIAL COSMETIC SURGERY     THORACOTOMY      Family History  Problem Relation Age of Onset   Diabetes Mother    Heart failure Mother    Diabetes Father    Heart failure Father    Emphysema Father        smoked   Bipolar disorder Brother    Thyroid cancer Maternal Grandmother     Social History   Socioeconomic History   Marital status: Single    Spouse name: Not on file   Number of children: Not on file   Years of education: Not on file   Highest education level: Not on file  Occupational History   Not on file  Tobacco Use   Smoking status: Every Day    Packs/day: 1.00    Years: 30.00    Pack years: 30.00    Types: Cigarettes   Smokeless tobacco: Never  Vaping Use   Vaping Use: Never used  Substance and Sexual Activity   Alcohol use: No    Comment: recently quit 3 mths ago   Drug use: No   Sexual activity: Not on file  Other Topics Concern   Not on file  Social History  Narrative   Not on file   Social Determinants of Health   Financial Resource Strain: Not on file  Food Insecurity: Not on file  Transportation Needs: Not on file  Physical Activity: Not on file  Stress: Not on file  Social Connections: Not on file  Intimate Partner Violence: Not on file    ROS Review of Systems  Constitutional:  Positive for fatigue. Negative for fever.  Respiratory:  Positive for cough.   Gastrointestinal:  Positive for abdominal pain, blood in stool and diarrhea.  All other systems reviewed and are negative.  Objective:   Today's Vitals: BP (!) 147/76 (BP Location: Right Arm, Patient Position: Sitting, Cuff Size: Large)   Pulse 97   Temp 97.9 F (36.6 C) (Oral)   Resp 16   Wt 230 lb 3.2 oz (104.4 kg)   BMI 30.37 kg/m   Physical Exam Vitals and nursing note reviewed.  Constitutional:      General: He is not in acute distress. Cardiovascular:     Rate and Rhythm: Normal rate and regular rhythm.  Pulmonary:     Effort: Pulmonary effort is normal.     Breath sounds: Normal breath sounds.  Abdominal:     Palpations: Abdomen is soft.     Tenderness: There is no abdominal tenderness.  Neurological:     General: No focal deficit present.     Mental Status: He is alert and oriented to person, place, and time.    Assessment & Plan:   1. Essential hypertension Continue present management and monitor  2. Malaise and fatigue Patient opted to go to ED for more emergent evaluation and management.   3. Diarrhea, unspecified type As above  4. Encounter to establish care     Outpatient Encounter Medications as of 06/22/2021  Medication Sig   albuterol (VENTOLIN HFA) 108 (90 Base) MCG/ACT inhaler Inhale 2 puffs into the lungs every 4 (four) hours as needed for wheezing or shortness of breath.   lisinopril-hydrochlorothiazide (ZESTORETIC) 10-12.5 MG tablet Take 1 tablet by mouth daily.   azithromycin (ZITHROMAX) 250 MG tablet Take 2 tablets (500 mg)  on day 1, then take 1 tablet (250 mg) on days 2-5   budesonide-formoterol (SYMBICORT) 160-4.5 MCG/ACT inhaler Inhale 2 puffs into the lungs 2 (two) times daily. (Patient not taking: Reported on 10/30/2020)   chlorhexidine (PERIDEX) 0.12 % solution Use as directed 15 mLs in the mouth or throat 2 (two) times daily.   Respiratory Therapy Supplies (NEBULIZER) DEVI Please provide patient with nebulizer for personal home use. (Patient not taking: No sig reported)   No facility-administered encounter medications on file as of 06/22/2021.    Follow-up: Return if symptoms worsen or fail to improve.   Becky Sax, MD

## 2021-07-01 ENCOUNTER — Emergency Department (HOSPITAL_COMMUNITY)
Admission: EM | Admit: 2021-07-01 | Discharge: 2021-07-01 | Disposition: A | Discharge status: Home / Self Care | Payer: Self-pay | Attending: Emergency Medicine | Admitting: Emergency Medicine

## 2021-07-01 ENCOUNTER — Encounter (HOSPITAL_COMMUNITY): Payer: Self-pay | Admitting: Emergency Medicine

## 2021-07-01 ENCOUNTER — Emergency Department (HOSPITAL_COMMUNITY): Payer: Self-pay

## 2021-07-01 DIAGNOSIS — Z20822 Contact with and (suspected) exposure to covid-19: Secondary | ICD-10-CM | POA: Insufficient documentation

## 2021-07-01 DIAGNOSIS — J449 Chronic obstructive pulmonary disease, unspecified: Secondary | ICD-10-CM | POA: Insufficient documentation

## 2021-07-01 DIAGNOSIS — Z79899 Other long term (current) drug therapy: Secondary | ICD-10-CM | POA: Insufficient documentation

## 2021-07-01 DIAGNOSIS — R059 Cough, unspecified: Secondary | ICD-10-CM | POA: Insufficient documentation

## 2021-07-01 DIAGNOSIS — J029 Acute pharyngitis, unspecified: Secondary | ICD-10-CM | POA: Insufficient documentation

## 2021-07-01 DIAGNOSIS — Z8616 Personal history of COVID-19: Secondary | ICD-10-CM | POA: Insufficient documentation

## 2021-07-01 DIAGNOSIS — R509 Fever, unspecified: Secondary | ICD-10-CM | POA: Insufficient documentation

## 2021-07-01 DIAGNOSIS — F17210 Nicotine dependence, cigarettes, uncomplicated: Secondary | ICD-10-CM | POA: Insufficient documentation

## 2021-07-01 DIAGNOSIS — I10 Essential (primary) hypertension: Secondary | ICD-10-CM | POA: Insufficient documentation

## 2021-07-01 LAB — RESP PANEL BY RT-PCR (FLU A&B, COVID) ARPGX2
Influenza A by PCR: NEGATIVE
Influenza B by PCR: NEGATIVE
SARS Coronavirus 2 by RT PCR: NEGATIVE

## 2021-07-01 MED ORDER — PREDNISONE 20 MG PO TABS: ORAL_TABLET | ORAL | 0 refills | Status: DC

## 2021-07-01 MED ORDER — AEROCHAMBER Z-STAT PLUS/MEDIUM MISC
1.0000 | Freq: Once | Status: AC
  Administered 2021-07-01: 1
  Filled 2021-07-01: qty 1

## 2021-07-01 MED ORDER — ALBUTEROL SULFATE HFA 108 (90 BASE) MCG/ACT IN AERS
4.0000 | INHALATION_SPRAY | Freq: Once | RESPIRATORY_TRACT | Status: AC
  Administered 2021-07-01: 4 via RESPIRATORY_TRACT
  Filled 2021-07-01: qty 6.7

## 2021-07-01 MED ORDER — DOXYCYCLINE HYCLATE 100 MG PO TABS
100.0000 mg | ORAL_TABLET | Freq: Once | ORAL | Status: AC
  Administered 2021-07-01: 100 mg via ORAL
  Filled 2021-07-01: qty 1

## 2021-07-01 MED ORDER — PREDNISONE 20 MG PO TABS
60.0000 mg | ORAL_TABLET | Freq: Once | ORAL | Status: AC
  Administered 2021-07-01: 60 mg via ORAL
  Filled 2021-07-01: qty 3

## 2021-07-01 MED ORDER — IPRATROPIUM-ALBUTEROL 0.5-2.5 (3) MG/3ML IN SOLN
3.0000 mL | RESPIRATORY_TRACT | Status: AC
  Administered 2021-07-01 (×3): 3 mL via RESPIRATORY_TRACT
  Filled 2021-07-01: qty 3
  Filled 2021-07-01: qty 6

## 2021-07-01 MED ORDER — DOXYCYCLINE HYCLATE 100 MG PO CAPS: 100.0000 mg | ORAL_CAPSULE | Freq: Two times a day (BID) | ORAL | 0 refills | Status: DC

## 2021-07-01 NOTE — ED Provider Notes (Signed)
[facility] DEPT Provider Note   CSN: [id number] Arrival date & time: 07/01/21  0201     History Chief Complaint  Patient presents with   Cough    Ray Griffin is a 55 y.o. male.   Cough Cough characteristics:  Productive Sputum characteristics:  Yellow Severity:  Mild Duration:  3 days Timing:  Constant Progression:  Worsening Chronicity:  Recurrent Smoker: yes   Context: not animal exposure   Relieved by:  None tried Worsened by:  Nothing Ineffective treatments:  None tried Associated symptoms: fever and sore throat   Associated symptoms: no chest pain, no shortness of breath and no sinus congestion       Past Medical History:  Diagnosis Date   Anxiety    COPD (chronic obstructive pulmonary disease) ([place])    Emphysema    Hypertension    Tachycardia     Patient Active Problem List   Diagnosis Date Noted   COVID-19 10/30/2020   Pulmonary nodules/lesions, multiple 10/01/2017   COPD exacerbation ([place]) 10/01/2017   Acute cholecystitis 12/04/2014   COPD with acute exacerbation ([place]) 12/04/2014   Gallstones 12/03/2014   SINUSITIS - ACUTE-NOS 10/17/2009   Cigarette smoker 01/08/2008   PANIC ATTACK, ACUTE 06/18/2007   ALLERGIC RHINITIS 06/18/2007   COPD GOLD III/ still smoking 06/18/2007   DEPRESSION 04/04/2007   Essential hypertension 04/04/2007    Past Surgical History:  Procedure Laterality Date   CHOLECYSTECTOMY N/A 12/04/2014   Procedure: LAPAROSCOPIC CHOLECYSTECTOMY WITH POSSIBLE INTRAOPERATIVE CHOLANGIOGRAM;  Surgeon: Gayland Curry, MD;  Location: [facility];  Service: General;  Laterality: N/A;   FACIAL COSMETIC SURGERY     THORACOTOMY         Family History  Problem Relation Age of Onset   Diabetes Mother    Heart failure Mother    Diabetes Father    Heart failure Father    Emphysema Father        smoked   Bipolar disorder Brother    Thyroid cancer Maternal Grandmother     Social History   Tobacco Use    Smoking status: Every Day    Packs/day: 1.00    Years: 30.00    Pack years: 30.00    Types: Cigarettes   Smokeless tobacco: Never  Vaping Use   Vaping Use: Never used  Substance Use Topics   Alcohol use: No    Comment: recently quit 3 mths ago   Drug use: No    Home Medications Prior to Admission medications   Medication Sig Start Date End Date Taking? Authorizing Provider  doxycycline (VIBRAMYCIN) 100 MG capsule Take 1 capsule (100 mg total) by mouth 2 (two) times daily. One po bid x 7 days 07/01/21  Yes Leiya Keesey, Corene Cornea, MD  predniSONE (DELTASONE) 20 MG tablet 3 tabs po day one, then 2 tabs daily x 4 days 07/01/21  Yes Andoni Busch, Corene Cornea, MD  albuterol (VENTOLIN HFA) 108 (90 Base) MCG/ACT inhaler Inhale 2 puffs into the lungs every 4 (four) hours as needed for wheezing or shortness of breath. 02/23/21   Lacinda Axon, MD  lisinopril-hydrochlorothiazide (ZESTORETIC) 10-12.5 MG tablet Take 1 tablet by mouth daily.    [provider]    Allergies    Patient has no known allergies.  Review of Systems   Review of Systems  Constitutional:  Positive for fever.  HENT:  Positive for sore throat.   Respiratory:  Positive for cough. Negative for shortness of breath.   Cardiovascular:  Negative  for chest pain.   Physical Exam Updated Vital Signs BP 111/81   Pulse 78   Temp (!) 97.5 F (36.4 C) (Oral)   Resp 18   Ht 6\' 1"  (1.854 m)   Wt 104.3 kg   SpO2 97%   BMI 30.34 kg/m   Physical Exam Vitals and nursing note reviewed.  Constitutional:      Appearance: He is well-developed.  HENT:     Head: Normocephalic and atraumatic.     Nose: No congestion or rhinorrhea.     Mouth/Throat:     Mouth: Mucous membranes are moist.     Pharynx: Oropharynx is clear.  Eyes:     Pupils: Pupils are equal, round, and reactive to light.  Cardiovascular:     Rate and Rhythm: Normal rate.  Pulmonary:     Effort: Pulmonary effort is normal. Tachypnea present. No respiratory distress.      Breath sounds: Wheezing present.  Abdominal:     General: Abdomen is flat. There is no distension.  Musculoskeletal:        General: Normal range of motion.     Cervical back: Normal range of motion.  Skin:    General: Skin is warm.  Neurological:     General: No focal deficit present.     Mental Status: He is alert.    ED Results / Procedures / Treatments   Labs (all labs ordered are listed, but only abnormal results are displayed) Labs Reviewed  RESP PANEL BY RT-PCR (FLU A&B, COVID) [id number]    EKG None  Radiology DG Chest 2 View  Result Date: 07/01/2021 CLINICAL DATA:  Cough EXAM: CHEST - 2 VIEW COMPARISON:  06/15/2021 FINDINGS: Unchanged bibasilar scarring. No acute abnormality. Normal cardiomediastinal contours. IMPRESSION: Unchanged bibasilar scarring. Electronically Signed   By: Ulyses Jarred M.D.   On: 07/01/2021 02:49    Procedures Procedures   Medications Ordered in ED Medications  albuterol (VENTOLIN HFA) 108 (90 Base) MCG/ACT inhaler 4 puff (4 puffs Inhalation Given 07/01/21 0342)  aerochamber Z-Stat Plus/medium 1 each (1 each Other Given 07/01/21 0342)  ipratropium-albuterol (DUONEB) 0.5-2.5 (3) MG/3ML nebulizer solution 3 mL (3 mLs Nebulization Given 07/01/21 0539)  predniSONE (DELTASONE) tablet 60 mg (60 mg Oral Given 07/01/21 0509)  doxycycline (VIBRA-TABS) tablet 100 mg (100 mg Oral Given 07/01/21 8413)    ED Course  I have reviewed the triage vital signs and the nursing notes.  Pertinent labs & imaging results that were available during my care of the patient were reviewed by me and considered in my medical decision making (see chart for details).    MDM Rules/Calculators/A&P                         Likely bronchitis for his acute episode.  We did discuss that he has had so years with no prolonged weight loss, fatigue, weakness and just not feeling well with multiple episodes of bronchitis and other issues to include GI bleeding.  Distant not seem to be  emergent but he understands the need to get this followed up by his primary doctor which he prefers.  As far as a bronchitis go treated here and will DC on similar medicines at home.  Follow-up with PCP.  Final Clinical Impression(s) / ED Diagnoses Final diagnoses:  Cough    Rx / DC Orders ED Discharge Orders          Ordered    doxycycline (VIBRAMYCIN) 100 MG capsule  2 times daily        07/01/21 0601    predniSONE (DELTASONE) 20 MG tablet        07/01/21 0601             Deleah Tison, Corene Cornea, MD 07/02/21 [phone]

## 2021-07-01 NOTE — ED Triage Notes (Addendum)
Pt c/o productive cough since Saturday. Exposure to bronchitis at workplace. Denies cp, abd pain, fevers, vomiting. Hx of copd

## 2021-07-27 ENCOUNTER — Encounter: Payer: Self-pay | Admitting: Family Medicine

## 2021-12-09 ENCOUNTER — Emergency Department (HOSPITAL_COMMUNITY)
Admission: EM | Admit: 2021-12-09 | Discharge: 2021-12-09 | Disposition: A | Discharge status: Home / Self Care | Payer: Self-pay | Attending: Emergency Medicine | Admitting: Emergency Medicine

## 2021-12-09 ENCOUNTER — Encounter (HOSPITAL_COMMUNITY): Payer: Self-pay | Admitting: Emergency Medicine

## 2021-12-09 ENCOUNTER — Emergency Department (HOSPITAL_COMMUNITY): Payer: Self-pay

## 2021-12-09 DIAGNOSIS — R4182 Altered mental status, unspecified: Secondary | ICD-10-CM | POA: Insufficient documentation

## 2021-12-09 DIAGNOSIS — Z79899 Other long term (current) drug therapy: Secondary | ICD-10-CM | POA: Insufficient documentation

## 2021-12-09 DIAGNOSIS — J449 Chronic obstructive pulmonary disease, unspecified: Secondary | ICD-10-CM | POA: Insufficient documentation

## 2021-12-09 DIAGNOSIS — I10 Essential (primary) hypertension: Secondary | ICD-10-CM | POA: Insufficient documentation

## 2021-12-09 DIAGNOSIS — R002 Palpitations: Secondary | ICD-10-CM | POA: Insufficient documentation

## 2021-12-09 DIAGNOSIS — F17210 Nicotine dependence, cigarettes, uncomplicated: Secondary | ICD-10-CM | POA: Insufficient documentation

## 2021-12-09 DIAGNOSIS — R0602 Shortness of breath: Secondary | ICD-10-CM | POA: Insufficient documentation

## 2021-12-09 DIAGNOSIS — I159 Secondary hypertension, unspecified: Secondary | ICD-10-CM

## 2021-12-09 LAB — URINALYSIS, ROUTINE W REFLEX MICROSCOPIC
Bilirubin Urine: NEGATIVE
Glucose, UA: NEGATIVE mg/dL
Hgb urine dipstick: NEGATIVE
Ketones, ur: 5 mg/dL — AB
Nitrite: NEGATIVE
Protein, ur: NEGATIVE mg/dL
Specific Gravity, Urine: 1.021 (ref 1.005–1.030)
pH: 5 (ref 5.0–8.0)

## 2021-12-09 LAB — CBC
HCT: 52.7 % — ABNORMAL HIGH (ref 39.0–52.0)
Hemoglobin: 16.6 g/dL (ref 13.0–17.0)
MCH: 27.4 pg (ref 26.0–34.0)
MCHC: 31.5 g/dL (ref 30.0–36.0)
MCV: 87 fL (ref 80.0–100.0)
Platelets: 282 10*3/uL (ref 150–400)
RBC: 6.06 MIL/uL — ABNORMAL HIGH (ref 4.22–5.81)
RDW: 13.1 % (ref 11.5–15.5)
WBC: 9.6 10*3/uL (ref 4.0–10.5)
nRBC: 0 % (ref 0.0–0.2)

## 2021-12-09 LAB — BASIC METABOLIC PANEL
Anion gap: 9 (ref 5–15)
BUN: 11 mg/dL (ref 6–20)
CO2: 25 mmol/L (ref 22–32)
Calcium: 9.1 mg/dL (ref 8.9–10.3)
Chloride: 104 mmol/L (ref 98–111)
Creatinine, Ser: 0.95 mg/dL (ref 0.61–1.24)
GFR, Estimated: >60 mL/min (ref >60)
Glucose, Bld: 100 mg/dL — ABNORMAL HIGH (ref 70–99)
Potassium: 4.4 mmol/L (ref 3.5–5.1)
Sodium: 138 mmol/L (ref 135–145)

## 2021-12-09 LAB — MAGNESIUM: Magnesium: 2.1 mg/dL (ref 1.7–2.4)

## 2021-12-09 LAB — TROPONIN I (HIGH SENSITIVITY)
Troponin I (High Sensitivity): 7 ng/L (ref <18)
Troponin I (High Sensitivity): 8 ng/L (ref <18)

## 2021-12-09 MED ORDER — LISINOPRIL 10 MG PO TABS
10.0000 mg | ORAL_TABLET | Freq: Once | ORAL | Status: AC
  Administered 2021-12-09: 21:00:00 10 mg via ORAL
  Filled 2021-12-09: qty 1

## 2021-12-09 MED ORDER — LISINOPRIL-HYDROCHLOROTHIAZIDE 10-12.5 MG PO TABS: 1.0000 | ORAL_TABLET | Freq: Every day | ORAL | 1 refills | Status: DC

## 2021-12-09 MED ORDER — HYDROCHLOROTHIAZIDE 12.5 MG PO TABS
12.5000 mg | ORAL_TABLET | Freq: Once | ORAL | Status: AC
  Administered 2021-12-09: 21:00:00 12.5 mg via ORAL
  Filled 2021-12-09: qty 1

## 2021-12-09 NOTE — ED Notes (Signed)
Lab called to add on Mag. 

## 2021-12-09 NOTE — ED Triage Notes (Signed)
Patient BIB [facility] from home with complaint of worsening shortness of breath and chest pain that started three weeks, history of hypertension taking lisinopril. Patient states shortness of breath and palpitations improved with EMS. Patienet also complains of bilateral lower extremity swelling and left arm rash that started several weeks ago that itches sometimes.  ? ?EMS vitals ?BP 290/120 ?HR 55 ?

## 2021-12-09 NOTE — ED Notes (Signed)
Pt taken to lobby before urine sample, pt has urine cup ?

## 2021-12-09 NOTE — ED Provider Triage Note (Signed)
Emergency Medicine Provider Triage Evaluation Note ? ?Ray Griffin , a 56 y.o. male  was evaluated in triage.  Pt complains of hypertension.  Patient states that over the past 2 to 3 weeks he has had worsening high blood pressure.  He states this morning he woke up and went to work however he felt slowed and "off" he states that he had some stumbling at some point today and was worried so he sat down.  He states that his symptoms did not improve so he called EMS.  Per patient blood pressure was 527 systolic.  He denies chest pain, shortness of breath, lightheadedness or dizziness, abdominal pain.  His only complaint is that he feels "off."  Endorses taking his medications as prescribed.. ? ?Review of Systems  ?Positive: See above ?Negative:  ? ?Physical Exam  ?BP (!) 185/100 (BP Location: Right Arm)   Pulse 61   Temp 98.3 ?F (36.8 ?C) (Oral)   Resp 18   SpO2 100%  ?Gen:   Awake, no distress   ?Resp:  Normal effort, lungs clear to auscultation bilaterally ?MSK:   Moves extremities without difficulty  ?Other:  S1/S2 without murmur.  Pulses 2+ radial.  Abdomen is soft and nontender.  No focal neurological deficits. ? ?Medical Decision Making  ?Medically screening exam initiated at 2:24 PM.  Appropriate orders placed.  EFE FAZZINO was informed that the remainder of the evaluation will be completed by another provider, this initial triage assessment does not replace that evaluation, and the importance of remaining in the ED until their evaluation is complete. ? ? ?  ?Mickie Hillier, PA-C ?12/09/21 1425 ? ?

## 2021-12-09 NOTE — ED Provider Notes (Signed)
[facility] EMERGENCY DEPARTMENT Provider Note   CSN: [id number] Arrival date & time: 12/09/21  1358     History  Chief Complaint  Patient presents with   Palpitations    Ray Griffin is a 56 y.o. male.  HPI  56 year old male with past medical history of HTN, COPD presents emergency department concern for high blood pressure and palpitations.  Patient states that he has history of hypertension, he stopped taking his high blood pressure medications a couple months ago and does not currently have a PCP.  Since then he has been having frequent palpitations that over the past couple days have been associated with shortness of breath.  He states he primarily has the palpitations when he smoking a cigarette and drinking coffee in the morning.  Denies any active chest pain, hemoptysis, back pain, swelling of his lower extremities.  No recent fever or illness.  Home Medications Prior to Admission medications   Medication Sig Start Date End Date Taking? Authorizing Provider  albuterol (VENTOLIN HFA) 108 (90 Base) MCG/ACT inhaler Inhale 2 puffs into the lungs every 4 (four) hours as needed for wheezing or shortness of breath. 02/23/21   Lacinda Axon, MD  doxycycline (VIBRAMYCIN) 100 MG capsule Take 1 capsule (100 mg total) by mouth 2 (two) times daily. One po bid x 7 days 07/01/21   Mesner, Corene Cornea, MD  lisinopril-hydrochlorothiazide (ZESTORETIC) 10-12.5 MG tablet Take 1 tablet by mouth daily. 12/09/21   Mariadelcarmen Corella, Alvin Critchley, DO  predniSONE (DELTASONE) 20 MG tablet 3 tabs po day one, then 2 tabs daily x 4 days 07/01/21   Mesner, Corene Cornea, MD      Allergies    Patient has no known allergies.    Review of Systems   Review of Systems  Constitutional:  Positive for fatigue. Negative for fever.  Respiratory:  Negative for chest tightness and shortness of breath.   Cardiovascular:  Positive for palpitations. Negative for chest pain and leg swelling.  Gastrointestinal:  Negative  for abdominal pain, diarrhea and vomiting.  Musculoskeletal:  Negative for back pain.  Skin:  Negative for rash.  Neurological:  Negative for headaches.   Physical Exam Updated Vital Signs BP (!) 170/102    Pulse (!) 57    Temp 98.2 F (36.8 C) (Oral)    Resp 16    SpO2 97%  Physical Exam Vitals and nursing note reviewed.  Constitutional:      General: He is not in acute distress.    Appearance: Normal appearance. He is not diaphoretic.  HENT:     Head: Normocephalic.     Mouth/Throat:     Mouth: Mucous membranes are moist.  Cardiovascular:     Rate and Rhythm: Normal rate.  Pulmonary:     Effort: Pulmonary effort is normal. No respiratory distress.  Abdominal:     Palpations: Abdomen is soft.     Tenderness: There is no abdominal tenderness.  Musculoskeletal:        General: No swelling.  Skin:    General: Skin is warm.  Neurological:     Mental Status: He is alert and oriented to person, place, and time. Mental status is at baseline.  Psychiatric:        Mood and Affect: Mood normal.    ED Results / Procedures / Treatments   Labs (all labs ordered are listed, but only abnormal results are displayed) Labs Reviewed  BASIC METABOLIC PANEL - Abnormal; Notable for the following components:  Result Value   Glucose, Bld 100 (*)    All other components within normal limits  CBC - Abnormal; Notable for the following components:   RBC 6.06 (*)    HCT 52.7 (*)    All other components within normal limits  URINALYSIS, ROUTINE W REFLEX MICROSCOPIC - Abnormal; Notable for the following components:   Ketones, ur 5 (*)    Leukocytes,Ua TRACE (*)    Bacteria, UA RARE (*)    All other components within normal limits  MAGNESIUM  TROPONIN I (HIGH SENSITIVITY)  TROPONIN I (HIGH SENSITIVITY)    EKG EKG Interpretation  Date/Time:  Thursday December 09 2021 13:59:03 EST Ventricular Rate:  67 PR Interval:  160 QRS Duration: 90 QT Interval:  402 QTC Calculation: 424 R  Axis:   82 Text Interpretation: Sinus rhythm with sinus arrhythmia with frequent Premature ventricular complexes Septal infarct , age undetermined Abnormal ECG When compared with ECG of 15-Jun-2021 14:08, PREVIOUS ECG IS PRESENT Confirmed by Lavenia Atlas [phone]) on 12/09/2021 8:54:54 PM  Radiology DG Chest 2 View  Result Date: 12/09/2021 CLINICAL DATA:  Weakness and dizziness. EXAM: CHEST - 2 VIEW COMPARISON:  Chest x-ray dated July 01, 2021. FINDINGS: The heart size and mediastinal contours are within normal limits. Unchanged bibasilar scarring. No focal consolidation, pleural effusion, or pneumothorax. No acute osseous abnormality. IMPRESSION: 1. No acute cardiopulmonary disease. Electronically Signed   By: Titus Dubin M.D.   On: 12/09/2021 14:44   CT Head Wo Contrast  Result Date: 12/09/2021 CLINICAL DATA:  Altered mental status EXAM: CT HEAD WITHOUT CONTRAST TECHNIQUE: Contiguous axial images were obtained from the base of the skull through the vertex without intravenous contrast. RADIATION DOSE REDUCTION: This exam was performed according to the departmental dose-optimization program which includes automated exposure control, adjustment of the mA and/or kV according to patient size and/or use of iterative reconstruction technique. COMPARISON:  CT head 10/08/2020 FINDINGS: Brain: There is no evidence of acute intracranial hemorrhage, extra-axial fluid collection, or acute infarct. There is mild global parenchymal volume loss and prominence of the ventricular system. Gray-white differentiation is preserved. There is no mass lesion. There is no midline shift. Vascular: No hyperdense vessel or unexpected calcification. Skull: Normal. Negative for fracture or focal lesion. Sinuses/Orbits: There is hyperostosis of the right maxillary sinus walls with absence of the medial wall which may reflect postsurgical change. There are also foci of dehiscence of the anterior and posterior walls. 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 There is deformity of the maxilla. Bilateral cheek implants are noted. These findings are overall unchanged. The globes and orbits are unremarkable. Other: None. IMPRESSION: 1. No acute intracranial pathology. 2. Mild parenchymal volume loss, slightly advanced for age. 3. 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 Electronically Signed   By: Valetta Mole M.D.   On: 12/09/2021 14:53    Procedures Procedures    Medications Ordered in ED Medications  lisinopril (ZESTRIL) tablet 10 mg (10 mg Oral Given 12/09/21 2127)  hydrochlorothiazide (HYDRODIURIL) tablet 12.5 mg (12.5 mg Oral Given 12/09/21 2126)    ED Course/ Medical Decision Making/ A&P                           Medical Decision Making Amount and/or Complexity of Data Reviewed Labs: ordered.  Risk Prescription drug management.   56 year old male presents emergency department with concern for HTN and palpitations.  He is noncompliant with his hypertensive medication  for the past couple months.  Currently does not have a primary doctor.  Right now he is asymptomatic.  Palpitations are primarily in the morning when he is smoking a cigarette and drinking coffee.  Work-up here is reassuring.  EKG is sinus rhythm, PVCs.  Blood work is reassuring, negative troponin, electrolytes and magnesium are normal.  Patient used to be on lisinopril-hydrochlorothiazide.  We will give him a dose of his home medicine here in the department and refill this medicine.  We will refer him for primary doctor as an outpatient.  Otherwise the patient has no signs of arrhythmia, ACS, PE.  He states that he feels baseline is requesting to leave.  Patient at this time appears safe and stable for discharge and close outpatient follow up. Discharge plan and strict return to ED precautions discussed, patient verbalizes understanding and agreement.        Final Clinical Impression(s) / ED Diagnoses Final diagnoses:   Palpitations  Secondary hypertension    Rx / DC Orders ED Discharge Orders          Ordered    lisinopril-hydrochlorothiazide (ZESTORETIC) 10-12.5 MG tablet  Daily        12/09/21 2157              Lorelle Gibbs, DO 12/09/21 2205

## 2021-12-09 NOTE — Discharge Instructions (Signed)
You have been seen and discharged from the emergency department.  Your work-up showed no acute finding.  Your blood pressure was high, it is important that you are compliant with your blood pressure medication as directed.  A new prescription has been sent for you with a refill.  You need to establish primary care.  Follow-up with your primary provider for further evaluation and further care.  In regards to your palpitations you are having frequent PVCs.  You may require Holter monitoring for further evaluation.  Take home medications as prescribed. If you have any worsening symptoms or further concerns for your health please return to an emergency department for further evaluation. ?

## 2022-07-11 ENCOUNTER — Emergency Department (HOSPITAL_COMMUNITY)
Admission: EM | Admit: 2022-07-11 | Discharge: 2022-07-11 | Disposition: A | Discharge status: Home / Self Care | Payer: Self-pay | Attending: Emergency Medicine | Admitting: Emergency Medicine

## 2022-07-11 ENCOUNTER — Emergency Department (HOSPITAL_COMMUNITY): Payer: Self-pay

## 2022-07-11 ENCOUNTER — Encounter (HOSPITAL_COMMUNITY): Payer: Self-pay

## 2022-07-11 DIAGNOSIS — Z7951 Long term (current) use of inhaled steroids: Secondary | ICD-10-CM | POA: Insufficient documentation

## 2022-07-11 DIAGNOSIS — I10 Essential (primary) hypertension: Secondary | ICD-10-CM | POA: Insufficient documentation

## 2022-07-11 DIAGNOSIS — I493 Ventricular premature depolarization: Secondary | ICD-10-CM | POA: Insufficient documentation

## 2022-07-11 DIAGNOSIS — Z79899 Other long term (current) drug therapy: Secondary | ICD-10-CM | POA: Insufficient documentation

## 2022-07-11 DIAGNOSIS — F17210 Nicotine dependence, cigarettes, uncomplicated: Secondary | ICD-10-CM | POA: Insufficient documentation

## 2022-07-11 DIAGNOSIS — J449 Chronic obstructive pulmonary disease, unspecified: Secondary | ICD-10-CM | POA: Insufficient documentation

## 2022-07-11 LAB — COMPREHENSIVE METABOLIC PANEL
ALT: 38 U/L (ref 0–44)
AST: 26 U/L (ref 15–41)
Albumin: 3.8 g/dL (ref 3.5–5.0)
Alkaline Phosphatase: 104 U/L (ref 38–126)
Anion gap: 8 (ref 5–15)
BUN: 10 mg/dL (ref 6–20)
CO2: 26 mmol/L (ref 22–32)
Calcium: 8.8 mg/dL — ABNORMAL LOW (ref 8.9–10.3)
Chloride: 103 mmol/L (ref 98–111)
Creatinine, Ser: 0.8 mg/dL (ref 0.61–1.24)
GFR, Estimated: >60 mL/min (ref >60)
Glucose, Bld: 104 mg/dL — ABNORMAL HIGH (ref 70–99)
Potassium: 4.1 mmol/L (ref 3.5–5.1)
Sodium: 137 mmol/L (ref 135–145)
Total Bilirubin: 0.7 mg/dL (ref 0.3–1.2)
Total Protein: 7 g/dL (ref 6.5–8.1)

## 2022-07-11 LAB — CBC
HCT: 49.9 % (ref 39.0–52.0)
Hemoglobin: 16.1 g/dL (ref 13.0–17.0)
MCH: 28.1 pg (ref 26.0–34.0)
MCHC: 32.3 g/dL (ref 30.0–36.0)
MCV: 87.1 fL (ref 80.0–100.0)
Platelets: 296 10*3/uL (ref 150–400)
RBC: 5.73 MIL/uL (ref 4.22–5.81)
RDW: 12.8 % (ref 11.5–15.5)
WBC: 11.8 10*3/uL — ABNORMAL HIGH (ref 4.0–10.5)
nRBC: 0 % (ref 0.0–0.2)

## 2022-07-11 LAB — MAGNESIUM: Magnesium: 2 mg/dL (ref 1.7–2.4)

## 2022-07-11 MED ORDER — ALBUTEROL SULFATE HFA 108 (90 BASE) MCG/ACT IN AERS: 2.0000 | INHALATION_SPRAY | RESPIRATORY_TRACT | Status: DC | PRN

## 2022-07-11 MED ORDER — SODIUM CHLORIDE 0.9 % IV BOLUS
1000.0000 mL | Freq: Once | INTRAVENOUS | Status: AC
  Administered 2022-07-11: 1000 mL via INTRAVENOUS

## 2022-07-11 NOTE — ED Notes (Signed)
Patient transported to X-ray 

## 2022-07-11 NOTE — ED Triage Notes (Signed)
Pt presents with c/o fatigue, SHOB, and irregular HR x3 days. Afebrile. Denies sick contacts.

## 2022-07-11 NOTE — Discharge Instructions (Addendum)
You need to follow-up with cardiology for your PVCs.  Additionally, please restart your blood pressure medication as it may contribute to your symptoms.  Cardiology should be contacting you within the next 3 days however if you do not hear from them please call [facility].  It is important that you stop smoking cigarettes as this can be causing your PVCs.  Additionally, cut back on your caffeine use and see if that helps.  Get in touch with a PCP as well.  Dr. Renold Genta is a good option as well as [facility].

## 2022-07-11 NOTE — ED Provider Notes (Signed)
[facility] DEPT Provider Note   CSN: [id number] Arrival date & time: 07/11/22  9449     History  Chief Complaint  Patient presents with   Shortness of Breath    Ray Griffin is a 56 y.o. male with a past medical history of hypertension, COPD and palpitations presenting today with palpitations and shortness of breath.  Reports that on Saturday he started to notice his heart was "beating out of discharge."  He laid down in bed and felt better.  On Sunday when he got up he started to feel worse with more intense palpitations.  He says it always gets better when he sits or lays down.  Says that occasionally he has shortness of breath, and when this happens.  Otherwise not short of breath.  Does report smoking 1-1/2 packs of cigarettes a day.  Also takes lisinopril/hydrochlorothiazide for his blood pressure but says that he discontinued this on Thursday because he thinks it is "making him feel funny."  Has been on this medication for over a year.  No pain, recent travel, recent surgery, history of DVT or heart failure.    Shortness of Breath Associated symptoms: no chest pain, no fever and no vomiting        Home Medications Prior to Admission medications   Medication Sig Start Date End Date Taking? Authorizing Provider  albuterol (VENTOLIN HFA) 108 (90 Base) MCG/ACT inhaler Inhale 2 puffs into the lungs every 4 (four) hours as needed for wheezing or shortness of breath. 02/23/21   Lacinda Axon, MD  doxycycline (VIBRAMYCIN) 100 MG capsule Take 1 capsule (100 mg total) by mouth 2 (two) times daily. One po bid x 7 days 07/01/21   Mesner, Corene Cornea, MD  lisinopril-hydrochlorothiazide (ZESTORETIC) 10-12.5 MG tablet Take 1 tablet by mouth daily. 12/09/21   Horton, Alvin Critchley, DO  predniSONE (DELTASONE) 20 MG tablet 3 tabs po day one, then 2 tabs daily x 4 days 07/01/21   Mesner, Corene Cornea, MD      Allergies    Patient has no known allergies.    Review of Systems    Review of Systems  Constitutional:  Negative for chills and fever.  Respiratory:  Positive for shortness of breath. Negative for chest tightness.   Cardiovascular:  Positive for palpitations. Negative for chest pain.  Gastrointestinal:  Negative for diarrhea, nausea and vomiting.    Physical Exam Updated Vital Signs BP (!) 160/79 (BP Location: Right Arm)   Pulse 83   Temp 98 F (36.7 C) (Oral)   Resp (!) 21   Ht '6\' 1"'$  (1.854 m)   Wt 122.5 kg   SpO2 95%   BMI 35.62 kg/m  Physical Exam Vitals and nursing note reviewed.  Constitutional:      General: He is not in acute distress.    Appearance: Normal appearance. He is not ill-appearing.  HENT:     Head: Normocephalic and atraumatic.  Eyes:     General: No scleral icterus.    Conjunctiva/sclera: Conjunctivae normal.  Cardiovascular:     Rate and Rhythm: Normal rate and regular rhythm. Extrasystoles are present. Pulmonary:     Effort: Pulmonary effort is normal. No tachypnea or respiratory distress.     Breath sounds: No decreased breath sounds.  Musculoskeletal:     Right lower leg: No tenderness. No edema.     Left lower leg: No tenderness. No edema.  Skin:    General: Skin is warm and dry.  Findings: No rash.  Neurological:     Mental Status: He is alert.  Psychiatric:        Mood and Affect: Mood normal.     ED Results / Procedures / Treatments   Labs (all labs ordered are listed, but only abnormal results are displayed) Labs Reviewed  CBC - Abnormal; Notable for the following components:      Result Value   WBC 11.8 (*)    All other components within normal limits  COMPREHENSIVE METABOLIC PANEL - Abnormal; Notable for the following components:   Glucose, Bld 104 (*)    Calcium 8.8 (*)    All other components within normal limits  MAGNESIUM    EKG EKG Interpretation  Date/Time:  Monday July 11 2022 10:09:48 EDT Ventricular Rate:  77 PR Interval:  152 QRS Duration: 96 QT Interval:  384 QTC  Calculation: 435 R Axis:   103 Text Interpretation: Sinus rhythm Ventricular premature complex Right axis deviation Probable anteroseptal infarct, old Confirmed by Sherwood Gambler [phone]) on 07/11/2022 10:31:35 AM  Radiology DG Chest 2 View  Result Date: 07/11/2022 CLINICAL DATA:  Shortness of breath, irregular heart rate.  Fatigue. EXAM: CHEST - 2 VIEW COMPARISON:  Chest x-rays dated 12/09/2021 and 07/01/2021. FINDINGS: Borderline cardiomegaly, stable. Chronic bibasilar scarring/atelectasis, stable. No new lung findings. No pneumothorax is seen. No acute-appearing osseous abnormality. IMPRESSION: No active cardiopulmonary disease. Stable chronic bibasilar scarring/atelectasis. Electronically Signed   By: Franki Cabot M.D.   On: 07/11/2022 10:41    Procedures Procedures   Medications Ordered in ED Medications  albuterol (VENTOLIN HFA) 108 (90 Base) MCG/ACT inhaler 2 puff (has no administration in time range)    ED Course/ Medical Decision Making/ A&P                           Medical Decision Making Amount and/or Complexity of Data Reviewed Labs: ordered. Radiology: ordered.  Risk Prescription drug management.   This is a 84 who presents to the ED for concern of palpitations and shortness of breath.  Differential includes but is not limited to electrolyte abnormality, arrhythmia, PVCs/PACs, drug use, abnormal thyroid function and anemia.   This is not an exhaustive differential.    Past Medical History / Co-morbidities / Social History: Hypertension and COPD and tobacco use   Additional history: Per chart review patient presented similarly in March with a negative work-up.  He was referred to PCP.  Says that he does not have a PCP and has never seen cardiology.   Physical Exam: Pertinent physical exam findings include PVCs noted on monitor.  Not in bigeminy/trigeminy however does have relatively frequent PVCs Lung sounds clear, nontender chest with normal work of  breathing  Lab Tests: I ordered, and personally interpreted labs.  The pertinent results include: No pertinent   Imaging Studies: I ordered and independently visualized and interpreted patient's chest x-ray which was nonacute   Cardiac Monitoring:  The patient was maintained on a cardiac monitor.  My attending physician Dr. Verner Chol viewed and interpreted the cardiac monitored which showed an underlying rhythm of: Sinus with PVC   Medications: I ordered medication including fluids.    MDM/Disposition: This is a 56 year old male with past medical history of COPD and hypertension who presented today with palpitations and shortness of breath.  He was noted to have PVCs however no other arrhythmias on cardiac monitor.  Hemoglobin within normal limits.  No drug use.  Electrolytes were also  within normal limits.  At this time he is hemodynamically stable.  Oxygen saturations in the low 90s however per chart review this is baseline for patient with COPD.  I spoke with my attending physician about this patient and we both agree that he is stable for discharge and cardiology follow-up.  He was instructed to cut caffeine out of his diet as well as stop smoking cigarettes.  He will also restart his antihypertensives.    I discussed this case with my attending physician Dr. Regenia Skeeter who cosigned this note including patient's presenting symptoms, physical exam, and planned diagnostics and interventions. Attending physician stated agreement with plan or made changes to plan which were implemented.      Final Clinical Impression(s) / ED Diagnoses Final diagnoses:  Essential hypertension  Symptomatic PVCs    Rx / DC Orders ED Discharge Orders          Ordered    Ambulatory referral to Cardiology       Comments: If you have not heard from the Cardiology office within the next 72 hours please call [phone].   07/11/22 1043           Results and diagnoses were explained to the patient.  Return precautions discussed in full. Patient had no additional questions and expressed complete understanding.   This chart was dictated using voice recognition software.  Despite best efforts to proofread,  errors can occur which can change the documentation meaning.     Rhae Hammock, PA-C 07/11/22 1431    Sherwood Gambler, MD 07/11/22 1545

## 2022-07-21 ENCOUNTER — Ambulatory Visit: Payer: Self-pay | Admitting: Family Medicine

## 2022-08-08 ENCOUNTER — Ambulatory Visit: Payer: Self-pay | Attending: Internal Medicine | Admitting: Internal Medicine

## 2022-08-08 ENCOUNTER — Encounter: Payer: Self-pay | Admitting: Internal Medicine

## 2022-08-08 ENCOUNTER — Ambulatory Visit: Payer: Self-pay | Attending: Internal Medicine

## 2022-08-08 VITALS — BP 170/76 | HR 83 | Ht 73.0 in | Wt 271.0 lb

## 2022-08-08 DIAGNOSIS — R9431 Abnormal electrocardiogram [ECG] [EKG]: Secondary | ICD-10-CM

## 2022-08-08 DIAGNOSIS — I493 Ventricular premature depolarization: Secondary | ICD-10-CM

## 2022-08-08 DIAGNOSIS — R0602 Shortness of breath: Secondary | ICD-10-CM

## 2022-08-08 MED ORDER — METOPROLOL TARTRATE 50 MG PO TABS: 50.0000 mg | ORAL_TABLET | Freq: Once | ORAL | 0 refills | Status: DC

## 2022-08-08 MED ORDER — CHLORTHALIDONE 25 MG PO TABS: 25.0000 mg | ORAL_TABLET | Freq: Every day | ORAL | 3 refills | Status: DC

## 2022-08-08 MED ORDER — LOSARTAN POTASSIUM 50 MG PO TABS: 50.0000 mg | ORAL_TABLET | Freq: Every day | ORAL | 3 refills | Status: DC

## 2022-08-08 NOTE — Progress Notes (Signed)
Cardiology Office Note:    Date:  08/08/2022   ID:  WIN GUAJARDO, DOB September 24, 1966, MRN [id number]  PCP:  Dorna Mai, [place] Providers Cardiologist:  None     Referring MD: Dorna Mai, MD   No chief complaint on file. SOB, palpitations  History of Present Illness:    Ray Griffin is a 56 y.o. male with a hx of anxiety, COPD, HTN, smoker,  referral from the ED for palpitations. ECG shows sinus rhythm with PVCs  He notes chronic SOB, has hx of COPD. No chest pressure or squeezing. Still smokes, but has refrained over the last few weeks.  He notes LH. Felt presyncopal prior to the ED. Drinks 2 cups of coffee  He denies syncope  Mother had CABG , HTN. Father HTN. Grandmother with CHF  Doesn't check BP at home.  In 2012, had a stress echo, 9 METs. Hypertensive responses 217/115 mmHg. EF normal. Mild LVH.  Stress echo suggested possible inducible ischemia in the septum. Cath 06/09/2011  showed normal CORs.     Past Medical History:  Diagnosis Date   Anxiety    COPD (chronic obstructive pulmonary disease) ([place])    Emphysema    Hypertension    Tachycardia     Past Surgical History:  Procedure Laterality Date   CHOLECYSTECTOMY N/A 12/04/2014   Procedure: LAPAROSCOPIC CHOLECYSTECTOMY WITH POSSIBLE INTRAOPERATIVE CHOLANGIOGRAM;  Surgeon: Gayland Curry, MD;  Location: [facility];  Service: General;  Laterality: N/A;   FACIAL COSMETIC SURGERY     THORACOTOMY      Current Medications: No outpatient medications have been marked as taking for the 08/08/22 encounter (Appointment) with Janina Mayo, MD.     Allergies:   Patient has no known allergies.   Social History   Socioeconomic History   Marital status: Single    Spouse name: Not on file   Number of children: Not on file   Years of education: Not on file   Highest education level: Not on file  Occupational History   Not on file  Tobacco Use   Smoking status: Every Day    Packs/day: 1.00     Years: 30.00    Total pack years: 30.00    Types: Cigarettes   Smokeless tobacco: Never  Vaping Use   Vaping Use: Never used  Substance and Sexual Activity   Alcohol use: No    Comment: recently quit 3 mths ago   Drug use: No   Sexual activity: Not on file  Other Topics Concern   Not on file  Social History Narrative   Not on file   Social Determinants of Health   Financial Resource Strain: Not on file  Food Insecurity: Not on file  Transportation Needs: Not on file  Physical Activity: Not on file  Stress: Not on file  Social Connections: Not on file     Family History: The patient's family history includes Bipolar disorder in his brother; Diabetes in his father and mother; Emphysema in his father; Heart failure in his father and mother; Thyroid cancer in his maternal grandmother.  ROS:   Please see the history of present illness.     All other systems reviewed and are negative.  EKGs/Labs/Other Studies Reviewed:    The following studies were reviewed today:   EKG:  EKG is  ordered today.  The ekg ordered today demonstrates   08/08/2022- NSR, PVCs  Recent Labs: 07/11/2022: ALT 38; BUN 10; Creatinine, Ser  0.80; Hemoglobin 16.1; Magnesium 2.0; Platelets 296; Potassium 4.1; Sodium 137  Recent Lipid Panel No results found for: "CHOL", "TRIG", "HDL", "CHOLHDL", "VLDL", "LDLCALC", "LDLDIRECT"   Risk Assessment/Calculations:     Physical Exam:    VS:   Vitals:   08/08/22 0920  BP: (!) 170/76  Pulse: 83  SpO2: 98%     Wt Readings from Last 3 Encounters:  07/11/22 270 lb (122.5 kg)  07/01/21 230 lb (104.3 kg)  06/22/21 230 lb 3.2 oz (104.4 kg)     GEN:  Well nourished, well developed in no acute distress HEENT: Normal NECK: No JVD; No carotid bruits LYMPHATICS: No lymphadenopathy CARDIAC: RRR, no murmurs, rubs, gallops RESPIRATORY:  Clear to auscultation without rales, wheezing or rhonchi  ABDOMEN: Soft, non-tender, non-distended MUSCULOSKELETAL:   No edema; No deformity  SKIN: Warm and dry NEUROLOGIC:  Alert and oriented x 3 PSYCHIATRIC:  Normal affect   ASSESSMENT:    Palpitations: with CVD risk factors, will get coronary CT with PVCs and SOB. Will get zio to assess PVC burden. Also obtaining a TTE  HTN: not well controlled. Stop combo pill lisinopril 20-12.5 Hctz. Start Chlorthalidone 25 mg daily, losartan 50 mg daily PLAN:    In order of problems listed above:  Coronary CTA morph, metop tartrate 50 mg x1 3 day ziopatch  TTE Chlorthalidone 25 mg daily Losartan 50 mg daily BMET 2 weeks Follow up 6 months      Medication Adjustments/Labs and Tests Ordered: Current medicines are reviewed at length with the patient today.  Concerns regarding medicines are outlined above.  No orders of the defined types were placed in this encounter.  No orders of the defined types were placed in this encounter.   There are no Patient Instructions on file for this visit.   Signed, Janina Mayo, MD  08/08/2022 7:41 AM    [place]

## 2022-08-08 NOTE — Patient Instructions (Signed)
Medication Instructions:   STOP LISINOPRIL/ HYDROCHLOROTHIAZIDE   START: LOSARTAN '50mg'$  ONCE DAILY  START: CHLORTHALIDONE '25mg'$  ONCE DAILY   **PLEASE TAKE METOPROLOL TARTRATE '50mg'$  TWO HOURS PRIOR TO CCTA SCAN**   *If you need a refill on your cardiac medications before your next appointment, please call your pharmacy*  Lab Work:  Please return for Blood Work in [place]. No appointment needed, lab here at the office is open Monday-Friday from 8AM to 4PM and closed daily for lunch from 12:45-1:45.   If you have labs (blood work) drawn today and your tests are completely normal, you will receive your results only by: [place] (if you have MyChart) OR A paper copy in the mail If you have any lab test that is abnormal or we need to change your treatment, we will call you to review the results.  Testing/Procedures: Your physician has requested that you have an echocardiogram. Echocardiography is a painless test that uses sound waves to create images of your heart. It provides your doctor with information about the size and shape of your heart and how well your heart's chambers and valves are working. You may receive an ultrasound enhancing agent through an IV if needed to better visualize your heart during the echo.This procedure takes approximately one hour. There are no restrictions for this procedure. This will take place at the [address].   Your physician has requested that you have cardiac CT. Cardiac computed tomography (CT) is a painless test that uses an x-ray machine to take clear, detailed pictures of your heart. For further information please visit [facility]uge[facility]tn. Please follow instruction sheet as given.  Follow-Up: At [facility] you and your health needs are our priority.  As part of our continuing mission to provide you with exceptional heart care, we have created designated Provider Care Teams.  These Care Teams include your primary  Cardiologist (physician) and Advanced Practice Providers (APPs -  Physician Assistants and Nurse Practitioners) who all work together to provide you with the care you need, when you need it.  Your next appointment:   6 month(s)  The format for your next appointment:   In Person  Provider:   Janina Mayo, MD     Other Instructions Please obtain a blood pressure cuff (Omron) at [place] or [facility]. Please check blood pressures for one week. If they are on average > 130/80 mmHg then let us know     Your cardiac CT will be scheduled at one of the below locations:   [facility] [address] [place], [place] [address] [phone]  If scheduled at [facility], please arrive at the [facility] and Children's Entrance (Entrance C2) of [facility] 30 minutes prior to test start time. You can use the FREE valet parking offered at entrance C (encouraged to control the heart rate for the test)  Proceed to the [facility] Radiology Department (first floor) to check-in and test prep.  All radiology patients and guests should use entrance C2 at [facility], accessed from [facility], even though the hospital's physical address listed is [address].     Please follow these instructions carefully (unless otherwise directed):  Hold all erectile dysfunction medications at least 3 days (72 hrs) prior to test. (Ie viagra, cialis, sildenafil, tadalafil, etc) We will administer nitroglycerin during this exam.   On the Night Before the Test: Be sure to Drink plenty of water. Do  not consume any caffeinated/decaffeinated beverages or chocolate 12 hours prior to your test. Do not take any antihistamines 12 hours prior to your test.  On the Day of the Test: Drink plenty of water until 1 hour prior to the test. Do not eat any food 1 hour prior to test. You may take your regular medications prior to the test.  Take metoprolol (Lopressor) two  hours prior to test.      After the Test: Drink plenty of water. After receiving IV contrast, you may experience a mild flushed feeling. This is normal. On occasion, you may experience a mild rash up to 24 hours after the test. This is not dangerous. If this occurs, you can take Benadryl 25 mg and increase your fluid intake. If you experience trouble breathing, this can be serious. If it is severe call 911 IMMEDIATELY. If it is mild, please call our office. If you take any of these medications: Glipizide/Metformin, Avandament, Glucavance, please do not take 48 hours after completing test unless otherwise instructed.  We will call to schedule your test 2-4 weeks out understanding that some insurance companies will need an authorization prior to the service being performed.   For non-scheduling related questions, please contact the cardiac imaging nurse navigator should you have any questions/concerns: Marchia Bond, Cardiac Imaging Nurse Navigator Gordy Clement, Cardiac Imaging Nurse Navigator [place] Direct Office Dial: [phone]   For scheduling needs, including cancellations and rescheduling, please call [place], [phone]. Mg

## 2022-08-08 NOTE — Progress Notes (Addendum)
Heart and Vascular Care Navigation  08/08/2022  MATILDE MARKIE Jan 31, 1966 [id number]  Reason for Referral:  Patient is participating in a Managed Medicaid Plan: No, self pay only at this time.   Engaged with patient face to face for initial visit for Heart and Vascular Care Coordination.                                                                                                   Assessment:   LCSW spoke with pt during appt this morning at [facility]. Noted pt is currently uninsured; was able to introduce self, role, reason for visit. Pt confirmed home address, PCP, emergency contact remains brother (who lives in [place]). 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 He works at [employer], they do not offer insurance benefits at that role. We discussed [employer]  (CAFA), and [employer]. Pt open to completing these and gathering needed documents.   Pt pays for his portion of housing and utilities, he does not receive any assistance such as SNAP at this time. I discussed he should look into applying for these benefits to assist further with finances and when Medicaid benefits expand in December he may be able to enroll in full coverage. Pt also plans to speak with someone who is an Research scientist (life sciences) about if he has any options on the marketplace since open enrollment has started.   No additional questions/concerns voiced to this writer at this time.                                      HRT/VAS Care Coordination     Patients Home Cardiology Office [place] Team Social Worker   Social Worker Name: Valeda Malm, [place] [facility] [phone]   Living arrangements for the past 2 months [place]   Lives with: Roommate   Patient Current Insurance Coverage Self-Pay   Patient Has Concern With [place] Yes   Patient Concerns With  Medical Bills ongoing medical work up   Medical Bill Referrals: Rockne Menghini Card, [place] expansion flyer   Does Patient Have Prescription Coverage? No  states able to obtain medications and afford   Home Assistive Devices/Equipment Eyeglasses       Social History:                                                                             [place]: No Food Insecurity (08/08/2022)  Housing: Low Risk  (08/08/2022)  Transportation Needs: No Transportation Needs (08/08/2022)  Utilities: Not At Risk (08/08/2022)  Alcohol Screen: Low  Risk  (08/08/2022)  Depression (PHQ2-9): High Risk (06/22/2021)  Financial Resource Strain: Medium Risk (08/08/2022)  Tobacco Use: High Risk (08/08/2022)    SDOH Interventions: Financial Resources:  Financial Strain Interventions: Other (Comment) (CAFA; [employer]; [employer] card; Public affairs consultant) DSS for financial assistance and Occupational hygienist for [place] Insecurity:  Food Insecurity Interventions: Other (Comment) (provided SNAP card from [employer])  Housing Insecurity:  Housing Interventions: Intervention Not Indicated  Transportation:   Transportation Interventions: Intervention Not Indicated    Other Care Navigation Interventions:     Provided Pharmacy assistance resources  No concerns noted at this time.   Follow-up plan:   LCSW provied my card, SNAP referral card, CAFA, [employer] and [employer]. Pt was encouraged to reach out to me when he has gathered needed documents and we can assist with submitting application at that time.

## 2022-08-08 NOTE — Progress Notes (Unsigned)
Enrolled for [employer] to mail a ZIO XT long term holter monitor to the patients address on file.  

## 2022-08-13 DIAGNOSIS — I493 Ventricular premature depolarization: Secondary | ICD-10-CM

## 2022-08-13 DIAGNOSIS — R0602 Shortness of breath: Secondary | ICD-10-CM

## 2022-08-13 DIAGNOSIS — R9431 Abnormal electrocardiogram [ECG] [EKG]: Secondary | ICD-10-CM

## 2022-08-16 ENCOUNTER — Telehealth: Payer: Self-pay | Admitting: Licensed Clinical Social Worker

## 2022-08-16 NOTE — Telephone Encounter (Signed)
H&V Care Navigation CSW Progress Note  Clinical Social Worker contacted patient by phone to f/u on assistance applications reviewed during appt. Was able to reach pt at [phone], re-introduced self, role, reason for call. Pt actively gathering paperwork needed. He shares that he is also planning to meet with someone regarding enrolling in insurance moving forward since it is also open enrollment. He plans on coming for labs next week- he will call and let me know that he is coming and will bring paperwork at that time. I remain available.   Patient is participating in a Managed Medicaid Plan:  No, self pay only.  SDOH Screenings   Food Insecurity: No Food Insecurity (08/08/2022)  Housing: Low Risk  (08/08/2022)  Transportation Needs: No Transportation Needs (08/08/2022)  Utilities: Not At Risk (08/08/2022)  Alcohol Screen: Low Risk  (08/08/2022)  Depression (PHQ2-9): High Risk (06/22/2021)  Financial Resource Strain: Medium Risk (08/08/2022)  Tobacco Use: High Risk (08/08/2022)   Westley Hummer, MSW, [place]  [phone]- work cell phone (preferred) [phone]- desk phone

## 2022-08-22 ENCOUNTER — Telehealth (HOSPITAL_COMMUNITY): Payer: Self-pay | Admitting: *Deleted

## 2022-08-22 ENCOUNTER — Telehealth: Payer: Self-pay | Admitting: Licensed Clinical Social Worker

## 2022-08-22 LAB — BASIC METABOLIC PANEL
BUN/Creatinine Ratio: 14 (ref 9–20)
BUN: 13 mg/dL (ref 6–24)
CO2: 25 mmol/L (ref 20–29)
Calcium: 9.8 mg/dL (ref 8.7–10.2)
Chloride: 99 mmol/L (ref 96–106)
Creatinine, Ser: 0.95 mg/dL (ref 0.76–1.27)
Glucose: 72 mg/dL (ref 70–99)
Potassium: 4 mmol/L (ref 3.5–5.2)
Sodium: 139 mmol/L (ref 134–144)
eGFR: 93 mL/min/{1.73_m2} (ref >59)

## 2022-08-22 NOTE — Telephone Encounter (Signed)
Reaching out to patient to offer assistance regarding upcoming cardiac imaging study; pt verbalizes understanding of appt date/time, parking situation and where to check in, medications ordered, and verified current allergies; name and call back number provided for further questions should they arise  Ray Clement RN Navigator Cardiac Imaging [facility] [phone] office [phone] cell  Patient to take '50mg'$  metoprolol tartrate two hours prior to his cardiac CT scan.  He is aware to arrive at 8am.

## 2022-08-22 NOTE — Telephone Encounter (Signed)
H&V Care Navigation CSW Progress Note  Clinical Social Worker contacted patient by phone to f/u on paperwork again and answer any additional questions. LCSW able to reach pt at [phone]. Pt shares that he has been working on wage statements and getting last tax return. He will contact me when all is ready. No additional questions/concerns from pt at this time. He was made aware of upcoming clinic closures.   Patient is participating in a Managed Medicaid Plan:  No, self pay only  [place]: No Food Insecurity (08/08/2022)  Housing: Low Risk  (08/08/2022)  Transportation Needs: No Transportation Needs (08/08/2022)  Utilities: Not At Risk (08/08/2022)  Alcohol Screen: Low Risk  (08/08/2022)  Depression (PHQ2-9): High Risk (06/22/2021)  Financial Resource Strain: Medium Risk (08/08/2022)  Tobacco Use: High Risk (08/08/2022)    Westley Hummer, MSW, [place]  [phone]- work cell phone (preferred) [phone]- desk phone

## 2022-08-23 ENCOUNTER — Ambulatory Visit (HOSPITAL_COMMUNITY)
Admission: RE | Admit: 2022-08-23 | Discharge: 2022-08-23 | Disposition: A | Discharge status: Home / Self Care | Payer: Self-pay | Source: Ambulatory Visit | Attending: Internal Medicine | Admitting: Internal Medicine

## 2022-08-23 DIAGNOSIS — R9431 Abnormal electrocardiogram [ECG] [EKG]: Secondary | ICD-10-CM

## 2022-08-23 MED ORDER — NITROGLYCERIN 0.4 MG SL SUBL
0.8000 mg | SUBLINGUAL_TABLET | Freq: Once | SUBLINGUAL | Status: AC
  Administered 2022-08-23: 0.8 mg via SUBLINGUAL

## 2022-08-23 MED ORDER — IOHEXOL 350 MG/ML SOLN
100.0000 mL | Freq: Once | INTRAVENOUS | Status: AC | PRN
  Administered 2022-08-23: 100 mL via INTRAVENOUS

## 2022-08-23 MED ORDER — NITROGLYCERIN 0.4 MG SL SUBL
SUBLINGUAL_TABLET | SUBLINGUAL | Status: AC
  Filled 2022-08-23: qty 2

## 2022-08-30 ENCOUNTER — Ambulatory Visit (HOSPITAL_COMMUNITY): Payer: Self-pay | Attending: Internal Medicine

## 2022-08-30 DIAGNOSIS — R9431 Abnormal electrocardiogram [ECG] [EKG]: Secondary | ICD-10-CM

## 2022-08-30 DIAGNOSIS — I493 Ventricular premature depolarization: Secondary | ICD-10-CM

## 2022-08-30 DIAGNOSIS — R0602 Shortness of breath: Secondary | ICD-10-CM

## 2022-08-30 LAB — ECHOCARDIOGRAM COMPLETE
AR max vel: 2.83 cm2
AV Area VTI: 2.84 cm2
AV Area mean vel: 2.79 cm2
AV Mean grad: 15 mmHg
AV Peak grad: 28.3 mmHg
Ao pk vel: 2.66 m/s
Area-P 1/2: 3.48 cm2
S' Lateral: 3.5 cm

## 2022-08-30 MED ORDER — PERFLUTREN LIPID MICROSPHERE
1.0000 mL | INTRAVENOUS | Status: AC | PRN
  Administered 2022-08-30: 1 mL via INTRAVENOUS

## 2022-09-06 ENCOUNTER — Telehealth: Payer: Self-pay | Admitting: Licensed Clinical Social Worker

## 2022-09-06 NOTE — Telephone Encounter (Signed)
H&V Care Navigation CSW Progress Note  Clinical Social Worker contacted patient by phone to f/u on paperwork again and answer any additional questions. LCSW unable to reach pt at [phone]. Left voicemail requesting call back. Will f/u with pt again as able.     Patient is participating in a Managed Medicaid Plan:  No, self pay only  [place]: No Food Insecurity (08/08/2022)  Housing: Low Risk  (08/08/2022)  Transportation Needs: No Transportation Needs (08/08/2022)  Utilities: Not At Risk (08/08/2022)  Alcohol Screen: Low Risk  (08/08/2022)  Depression (PHQ2-9): High Risk (06/22/2021)  Financial Resource Strain: Medium Risk (08/08/2022)  Tobacco Use: High Risk (08/08/2022)    Westley Hummer, MSW, [place]  [phone]- work cell phone (preferred) [phone]- desk phone

## 2022-09-10 IMAGING — CR DG CHEST 2V
2 series · 2 of 2 positions shown · non-contrast
Comparison: Chest x-ray dated July 01, 2021.

CLINICAL DATA: Weakness and dizziness.

EXAM:
CHEST - 2 VIEW

[chest pa]
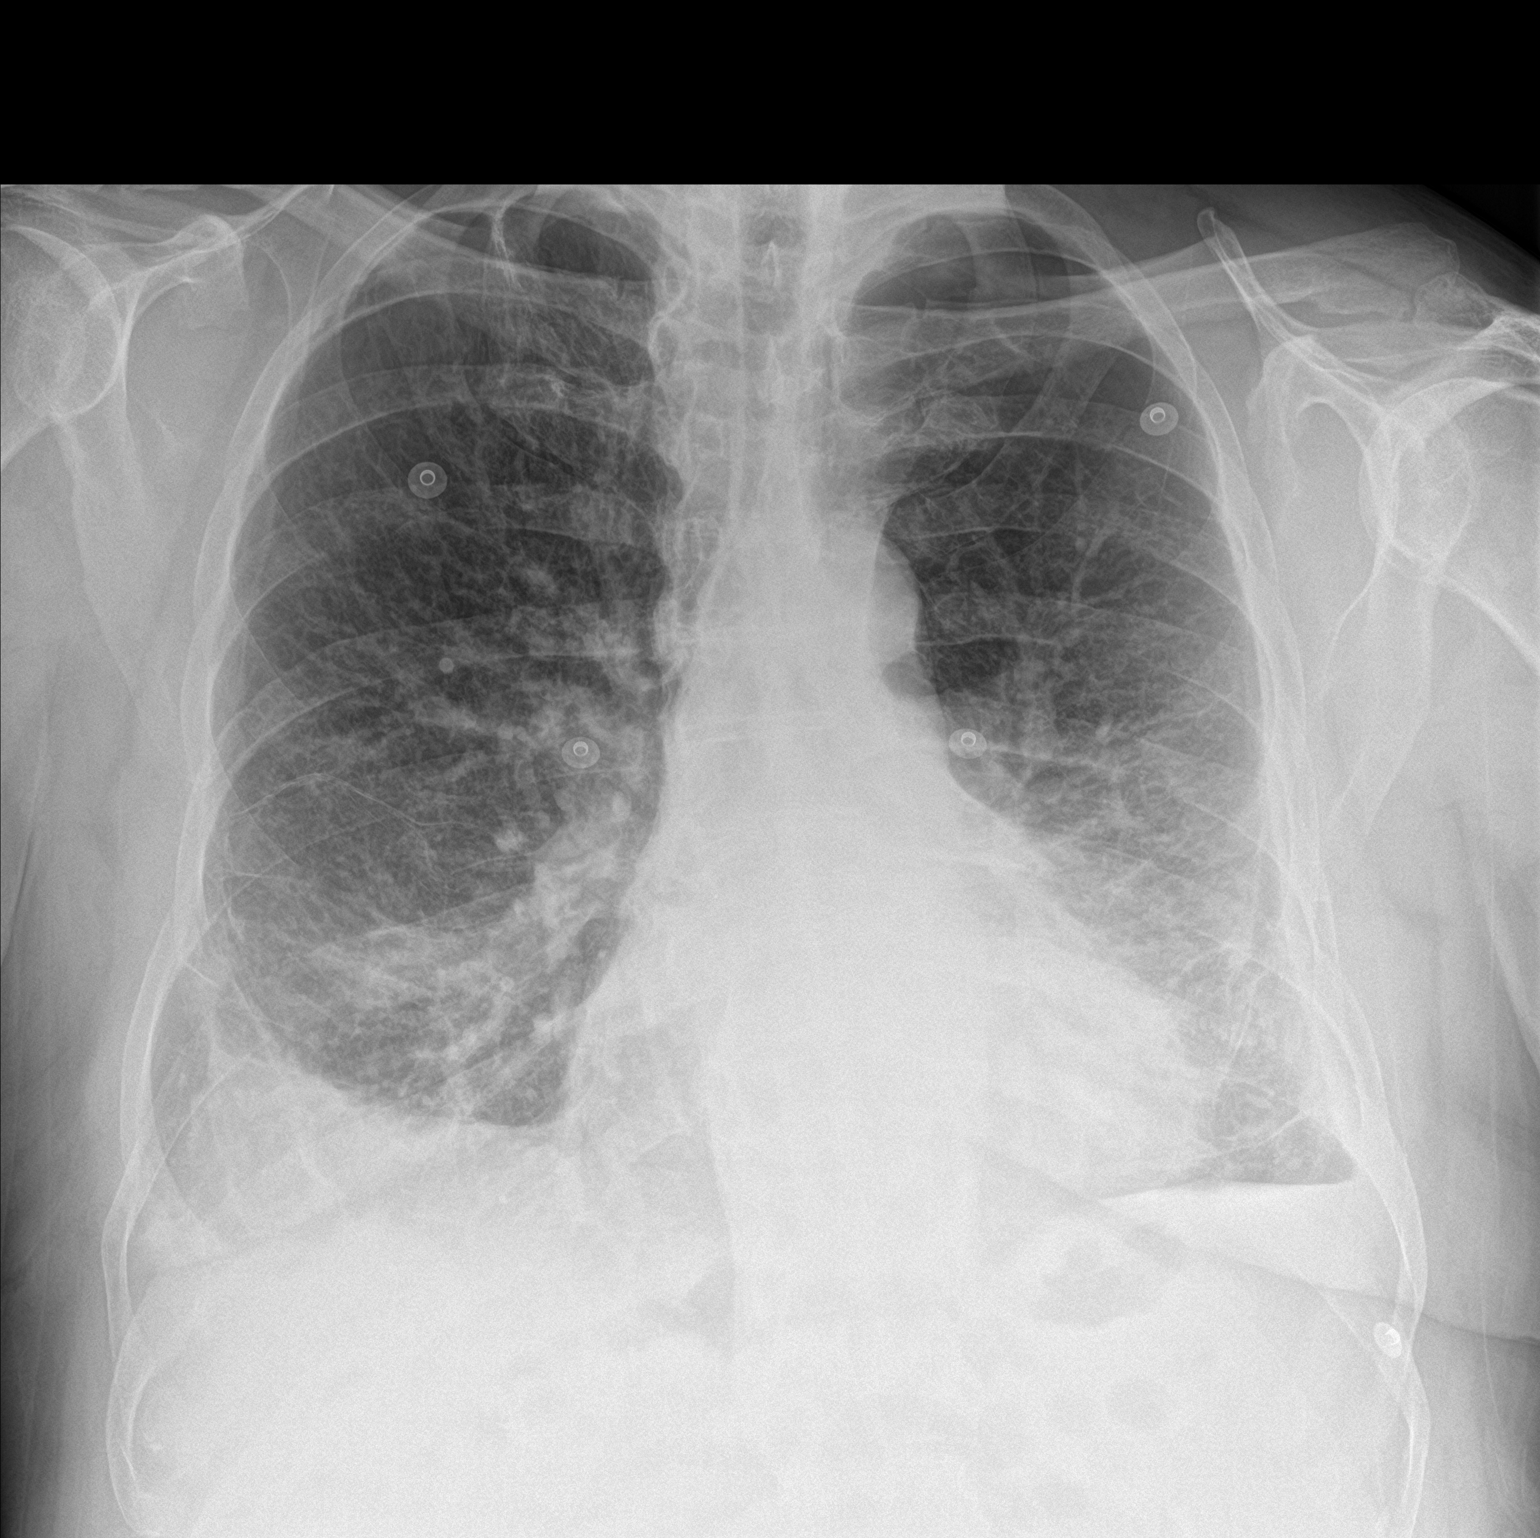

[chest lat]
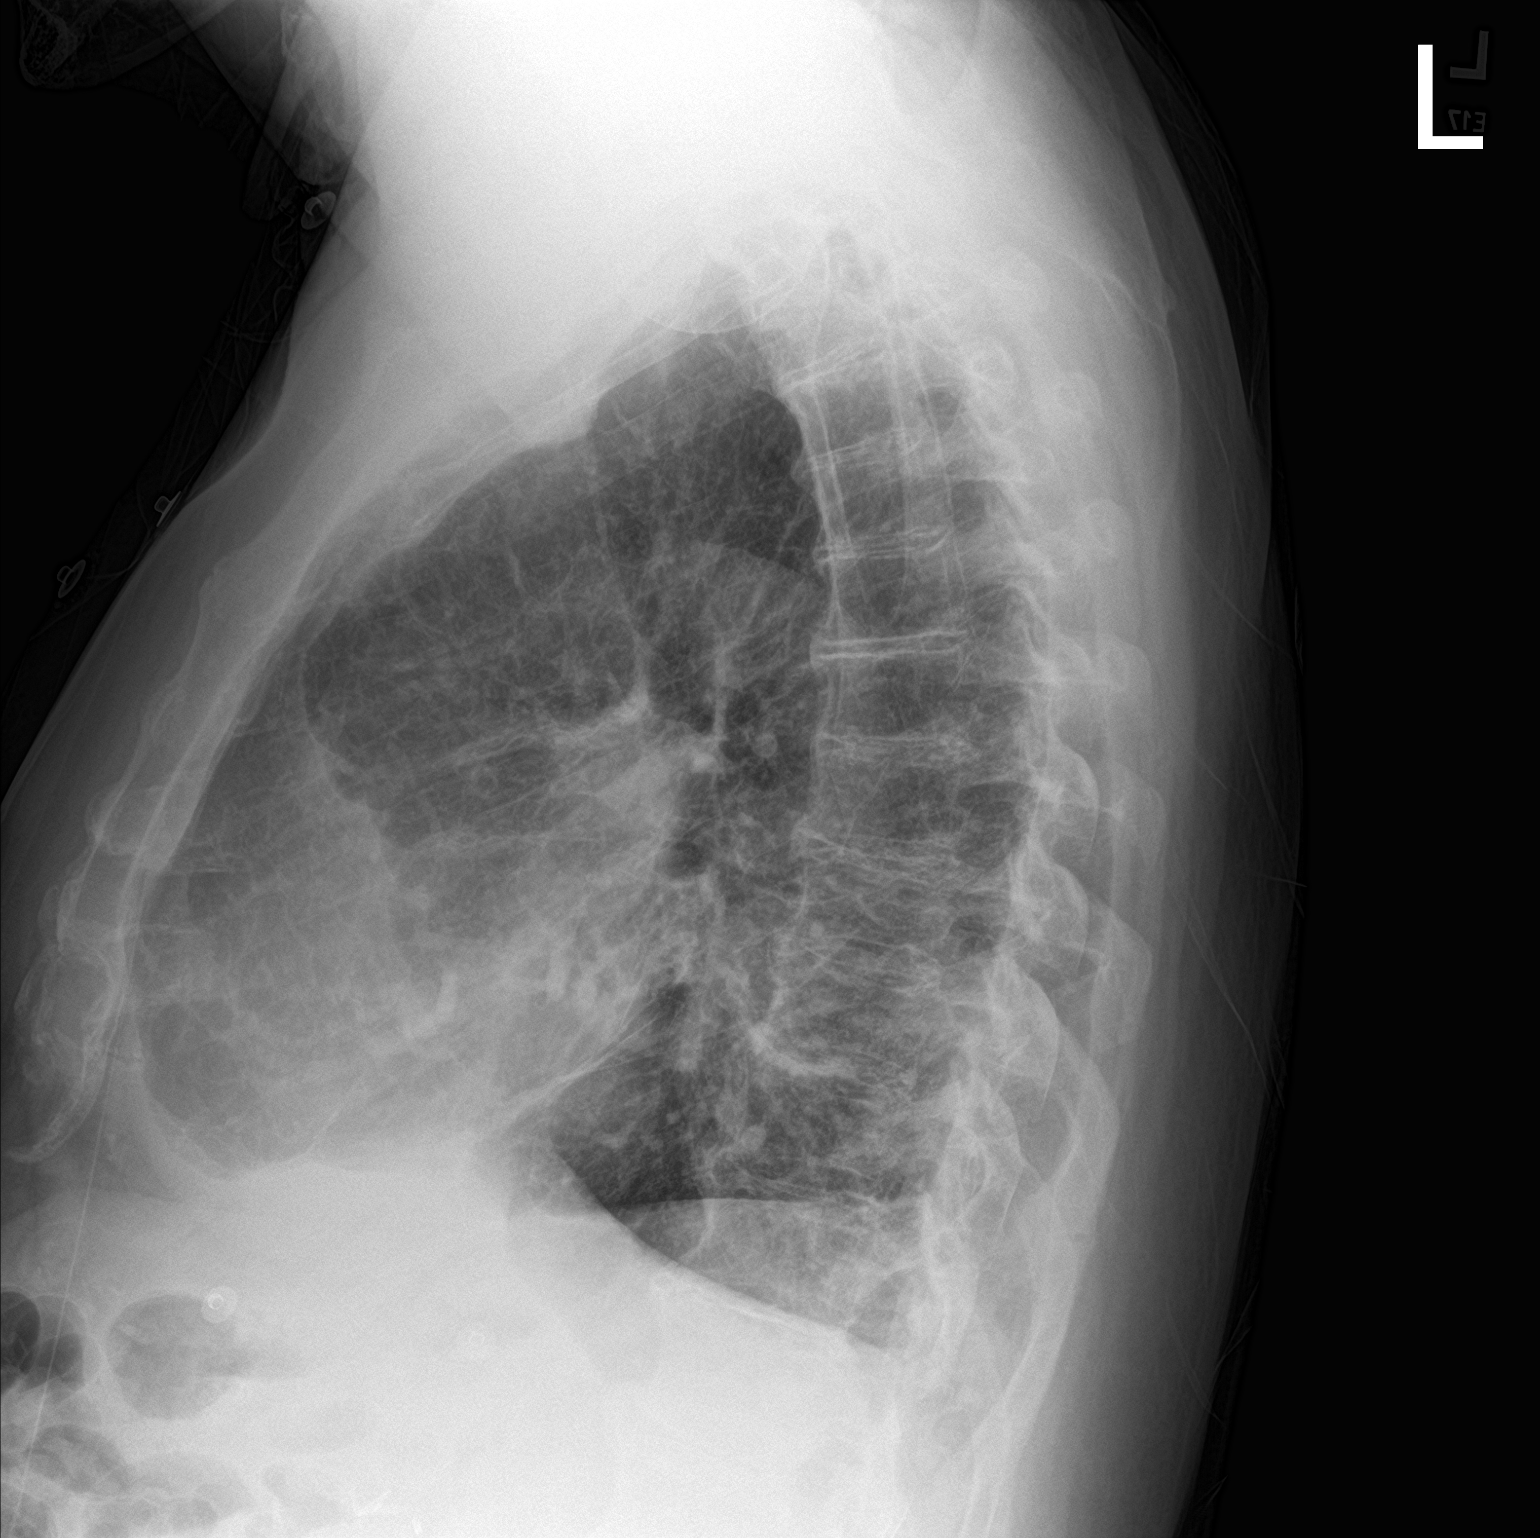

[2 of 2 positions shown; findings below may reference images not displayed]

FINDINGS: The heart size and mediastinal contours are within normal limits.
Unchanged bibasilar scarring. No focal consolidation, pleural
effusion, or pneumothorax. No acute osseous abnormality.
IMPRESSION: 1. No acute cardiopulmonary disease.

## 2022-09-10 IMAGING — CT CT HEAD W/O CM
4 series · 15 of 47 positions shown, 17 images · non-contrast
Comparison: CT head 10/08/2020

CLINICAL DATA: Altered mental status



[Series 3: head without · axial · non-contrast · 0.46mm/px · z∈[-168,-42]mm · 7 of 35 slices shown, 9 images]
[im 5/35  brain]
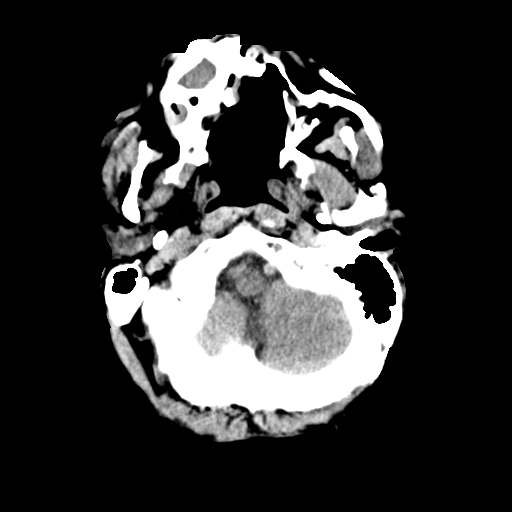
[im 5/35  bone]
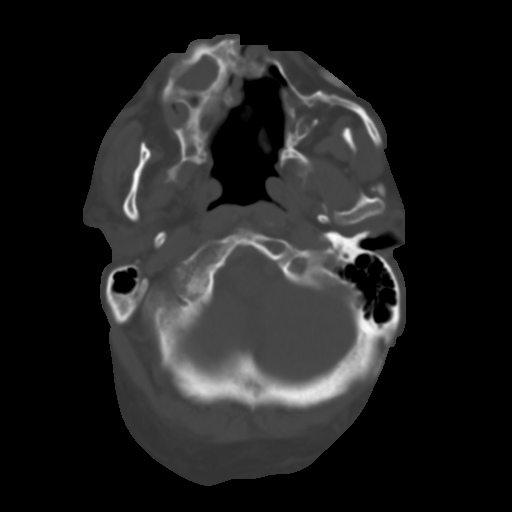
[im 9/35  brain]
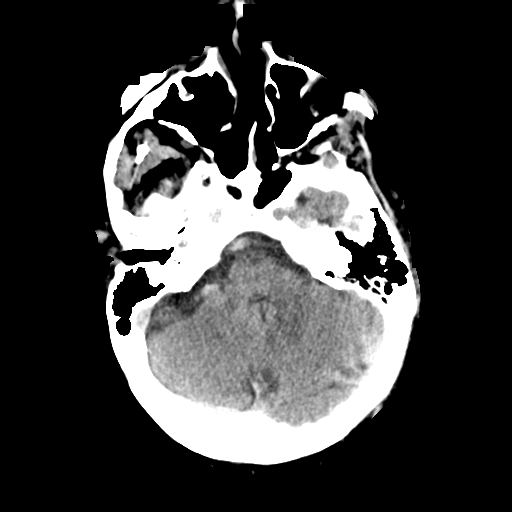
[im 13/35  brain]
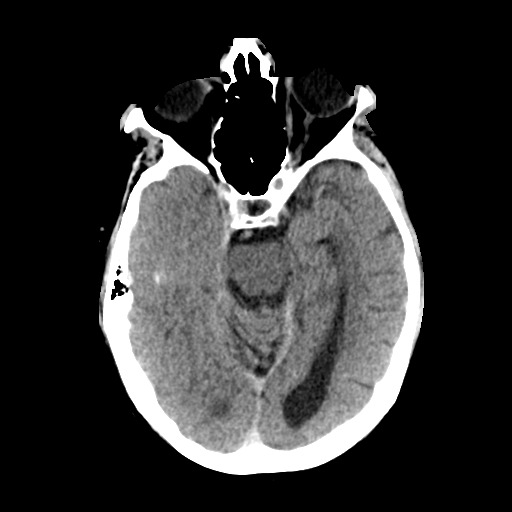
[im 18/35  brain]
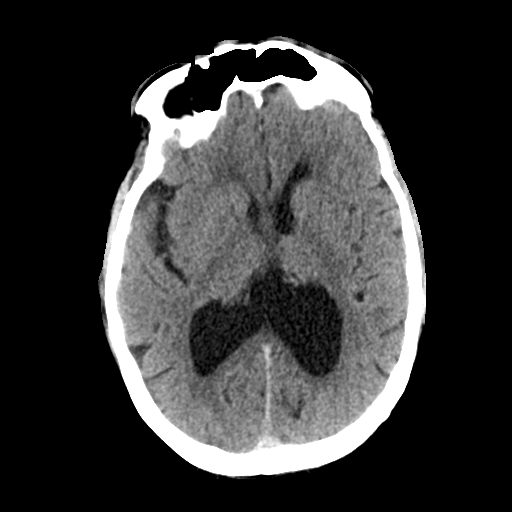
[im 22/35  brain]
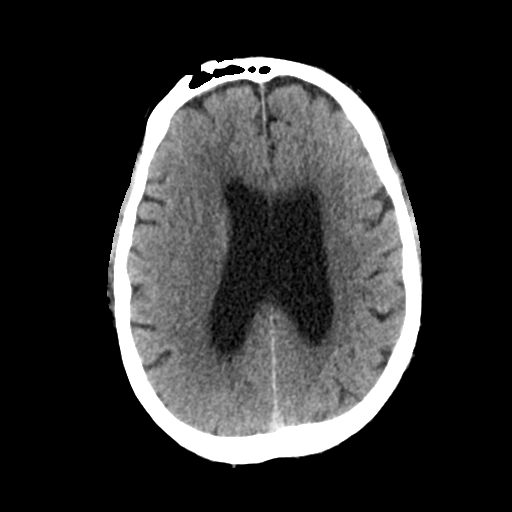
[im 22/35  bone]
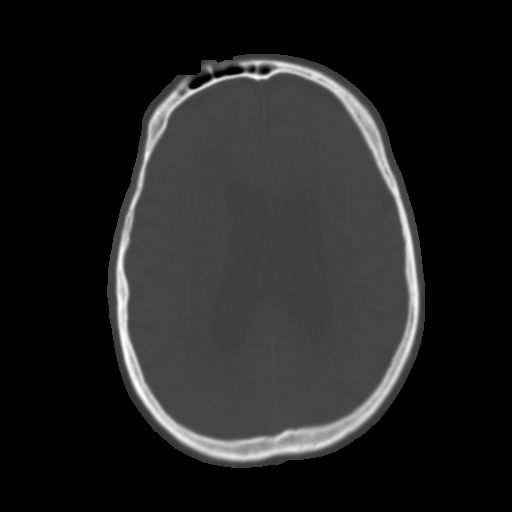
[im 26/35  brain]
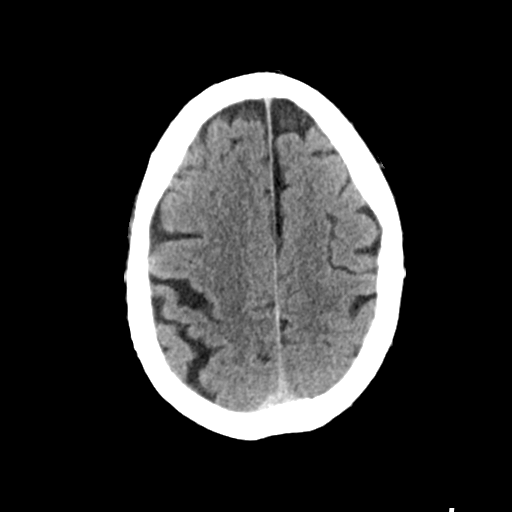
[im 30/35  brain]
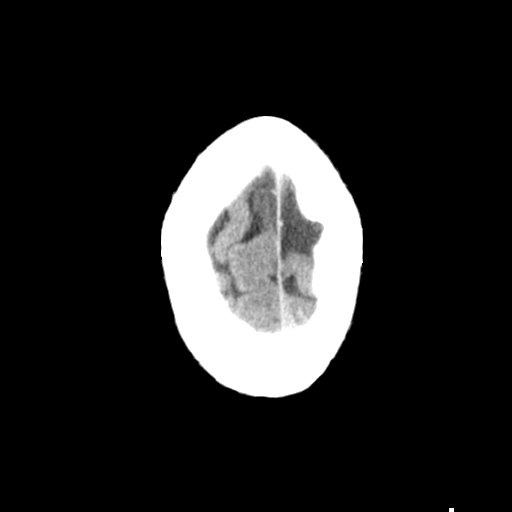

[Series 4: head bone · axial · 0.46mm/px · z∈[-172,-154]mm · 2 of 86 slices shown]
[im 9/86  bone]
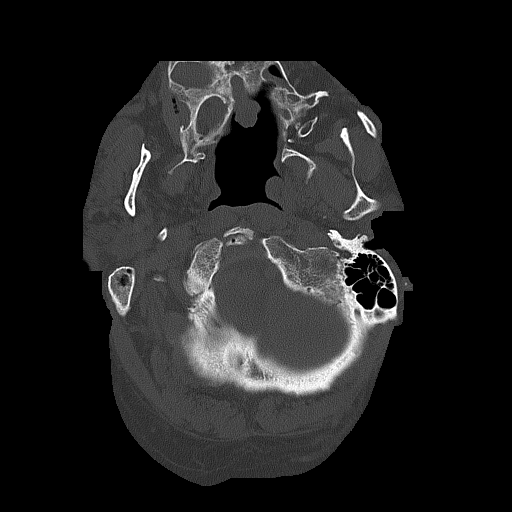
[im 18/86  bone]
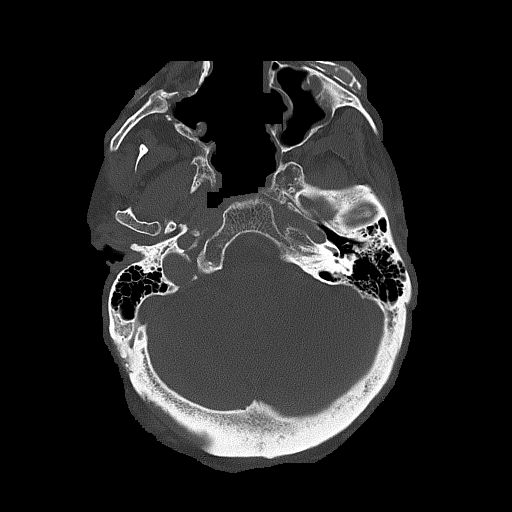

[Series 5: head without cor · coronal · non-contrast · 0.33mm/px · 3 of 79 slices shown]
[im 27/79  brain]
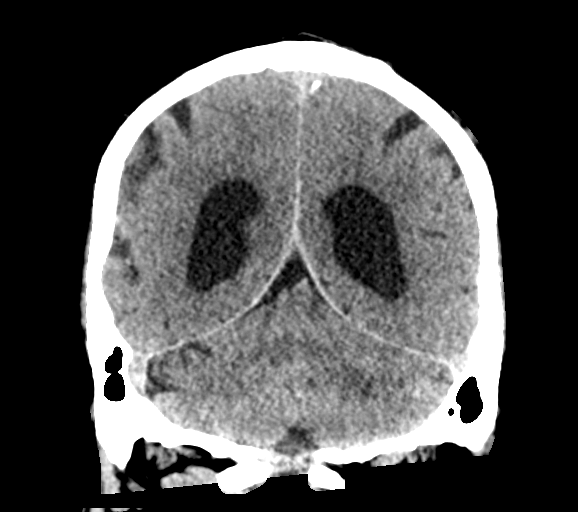
[im 35/79  brain]
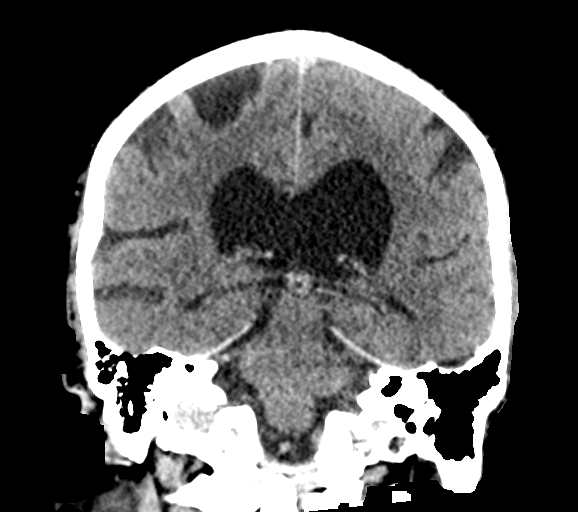
[im 44/79  brain]
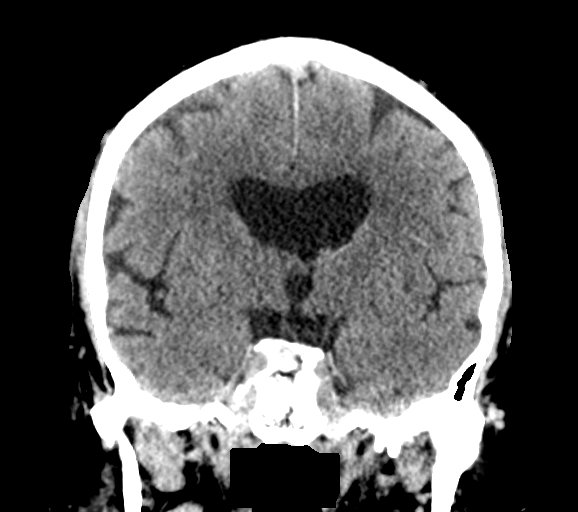

[Series 6: head without sag · sagittal · non-contrast · 0.33mm/px · 3 of 65 slices shown]
[im 22/65  brain]
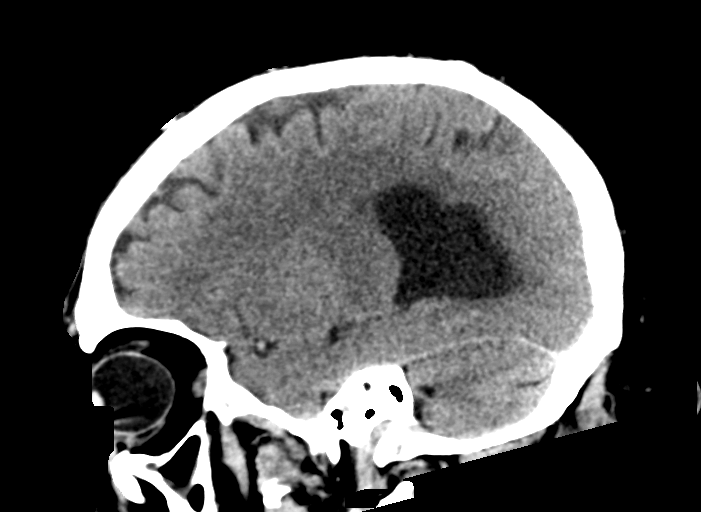
[im 33/65  brain]
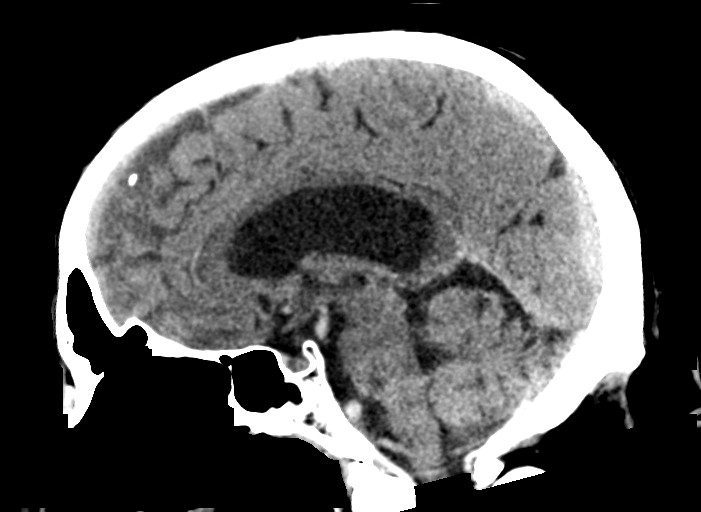
[im 43/65  brain]
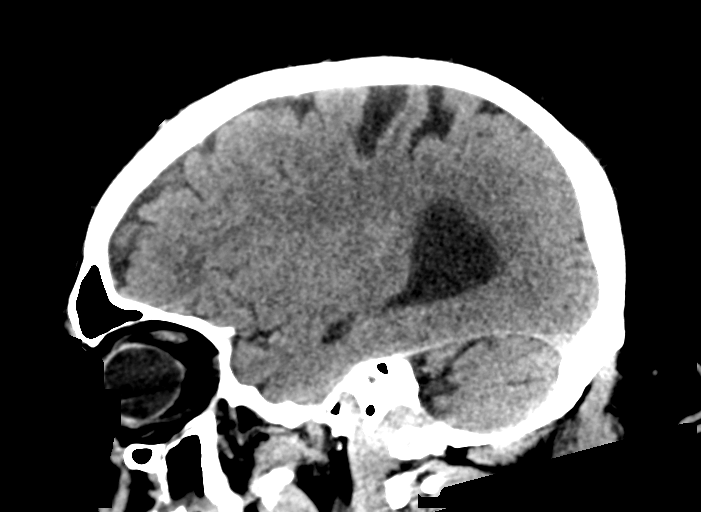

[15 of 47 positions shown; findings below may reference images not displayed]

FINDINGS: Brain: There is no evidence of acute intracranial hemorrhage,
extra-axial fluid collection, or acute infarct.

There is mild global parenchymal volume loss and prominence of the
ventricular system. Gray-white differentiation is preserved. There
is no mass lesion. There is no midline shift.

Vascular: No hyperdense vessel or unexpected calcification.

Skull: Normal. Negative for fracture or focal lesion.

Sinuses/Orbits: There is hyperostosis of the right maxillary sinus
walls with absence of the medial wall which may reflect postsurgical
change. There are also foci of dehiscence of the anterior and
posterior walls. There is a large nasal septal perforation. There is
deformity of the maxilla. Bilateral cheek implants are noted. These
findings are overall unchanged. The globes and orbits are
unremarkable.

Other: None.
IMPRESSION: 1. No acute intracranial pathology.
2. Mild parenchymal volume loss, slightly advanced for age.
3. Chronic changes in the sinonasal cavity as detailed above.

## 2022-09-19 ENCOUNTER — Telehealth: Payer: Self-pay | Admitting: Licensed Clinical Social Worker

## 2022-09-19 NOTE — Telephone Encounter (Signed)
H&V Care Navigation CSW Progress Note  Clinical Social Worker contacted patient by phone to f/u on paperwork again and answer any additional questions. LCSW unable to reach pt at [phone]. Left additional voicemail requesting call back. I remain available- will attempt one time more.    Patient is participating in a Managed Medicaid Plan:  No, self pay on  [place]: No Food Insecurity (08/08/2022)  Housing: Low Risk  (08/08/2022)  Transportation Needs: No Transportation Needs (08/08/2022)  Utilities: Not At Risk (08/08/2022)  Alcohol Screen: Low Risk  (08/08/2022)  Depression (PHQ2-9): High Risk (06/22/2021)  Financial Resource Strain: Medium Risk (08/08/2022)  Tobacco Use: High Risk (08/08/2022)    Westley Hummer, MSW, [place]  [phone]- work cell phone (preferred) [phone]- desk phone

## 2022-10-11 ENCOUNTER — Telehealth: Payer: Self-pay | Admitting: Licensed Clinical Social Worker

## 2022-10-11 NOTE — Telephone Encounter (Signed)
H&V Care Navigation CSW Progress Note  Clinical Social Worker contacted patient by phone to f/u on paperwork again and answer any additional questions. LCSW unable to reach pt at [phone]. Left voicemail requesting call back. Have attempted pt multiple times with no return call. Will not actively follow at this time unless pt returns call for additional assistance.      Patient is participating in a Managed Medicaid Plan:  No, self pay only  [place]: No Food Insecurity (08/08/2022)  Housing: Low Risk  (08/08/2022)  Transportation Needs: No Transportation Needs (08/08/2022)  Utilities: Not At Risk (08/08/2022)  Alcohol Screen: Low Risk  (08/08/2022)  Depression (PHQ2-9): High Risk (06/22/2021)  Financial Resource Strain: Medium Risk (08/08/2022)  Tobacco Use: High Risk (08/08/2022)    Westley Hummer, MSW, [place]  [phone]- work cell phone (preferred) [phone]- desk phone

## 2022-12-19 ENCOUNTER — Emergency Department (HOSPITAL_COMMUNITY)
Admission: EM | Admit: 2022-12-19 | Discharge: 2022-12-19 | Disposition: A | Discharge status: Home / Self Care | Payer: Self-pay | Attending: Emergency Medicine | Admitting: Emergency Medicine

## 2022-12-19 ENCOUNTER — Emergency Department (HOSPITAL_COMMUNITY): Payer: Self-pay

## 2022-12-19 ENCOUNTER — Other Ambulatory Visit: Payer: Self-pay

## 2022-12-19 ENCOUNTER — Encounter (HOSPITAL_COMMUNITY): Payer: Self-pay

## 2022-12-19 DIAGNOSIS — Z1152 Encounter for screening for COVID-19: Secondary | ICD-10-CM | POA: Insufficient documentation

## 2022-12-19 DIAGNOSIS — J441 Chronic obstructive pulmonary disease with (acute) exacerbation: Secondary | ICD-10-CM | POA: Insufficient documentation

## 2022-12-19 DIAGNOSIS — I5033 Acute on chronic diastolic (congestive) heart failure: Secondary | ICD-10-CM | POA: Insufficient documentation

## 2022-12-19 DIAGNOSIS — I110 Hypertensive heart disease with heart failure: Secondary | ICD-10-CM | POA: Insufficient documentation

## 2022-12-19 DIAGNOSIS — F17210 Nicotine dependence, cigarettes, uncomplicated: Secondary | ICD-10-CM | POA: Insufficient documentation

## 2022-12-19 LAB — BLOOD GAS, VENOUS
Acid-Base Excess: 5.1 mmol/L — ABNORMAL HIGH (ref 0.0–2.0)
Bicarbonate: 30.5 mmol/L — ABNORMAL HIGH (ref 20.0–28.0)
O2 Saturation: 57.6 %
Patient temperature: 37
pCO2, Ven: 47 mmHg (ref 44–60)
pH, Ven: 7.42 (ref 7.25–7.43)
pO2, Ven: <31 mmHg — CL (ref 32–45)

## 2022-12-19 LAB — COMPREHENSIVE METABOLIC PANEL
ALT: 52 U/L — ABNORMAL HIGH (ref 0–44)
AST: 35 U/L (ref 15–41)
Albumin: 4.1 g/dL (ref 3.5–5.0)
Alkaline Phosphatase: 89 U/L (ref 38–126)
Anion gap: 6 (ref 5–15)
BUN: 12 mg/dL (ref 6–20)
CO2: 26 mmol/L (ref 22–32)
Calcium: 8.5 mg/dL — ABNORMAL LOW (ref 8.9–10.3)
Chloride: 107 mmol/L (ref 98–111)
Creatinine, Ser: 0.91 mg/dL (ref 0.61–1.24)
GFR, Estimated: >60 mL/min (ref >60)
Glucose, Bld: 99 mg/dL (ref 70–99)
Potassium: 3.8 mmol/L (ref 3.5–5.1)
Sodium: 139 mmol/L (ref 135–145)
Total Bilirubin: 0.8 mg/dL (ref 0.3–1.2)
Total Protein: 6.9 g/dL (ref 6.5–8.1)

## 2022-12-19 LAB — CBC WITH DIFFERENTIAL/PLATELET
Abs Immature Granulocytes: 0.03 10*3/uL (ref 0.00–0.07)
Basophils Absolute: 0.1 10*3/uL (ref 0.0–0.1)
Basophils Relative: 1 %
Eosinophils Absolute: 0.1 10*3/uL (ref 0.0–0.5)
Eosinophils Relative: 1 %
HCT: 48.4 % (ref 39.0–52.0)
Hemoglobin: 15.6 g/dL (ref 13.0–17.0)
Immature Granulocytes: 0 %
Lymphocytes Relative: 19 %
Lymphs Abs: 2 10*3/uL (ref 0.7–4.0)
MCH: 28.7 pg (ref 26.0–34.0)
MCHC: 32.2 g/dL (ref 30.0–36.0)
MCV: 89 fL (ref 80.0–100.0)
Monocytes Absolute: 0.7 10*3/uL (ref 0.1–1.0)
Monocytes Relative: 6 %
Neutro Abs: 7.5 10*3/uL (ref 1.7–7.7)
Neutrophils Relative %: 73 %
Platelets: 250 10*3/uL (ref 150–400)
RBC: 5.44 MIL/uL (ref 4.22–5.81)
RDW: 12.9 % (ref 11.5–15.5)
WBC: 10.3 10*3/uL (ref 4.0–10.5)
nRBC: 0 % (ref 0.0–0.2)

## 2022-12-19 LAB — RESP PANEL BY RT-PCR (RSV, FLU A&B, COVID)  RVPGX2
Influenza A by PCR: NEGATIVE
Influenza B by PCR: NEGATIVE
Resp Syncytial Virus by PCR: NEGATIVE
SARS Coronavirus 2 by RT PCR: NEGATIVE

## 2022-12-19 LAB — TROPONIN I (HIGH SENSITIVITY)
Troponin I (High Sensitivity): 6 ng/L (ref <18)
Troponin I (High Sensitivity): 7 ng/L (ref <18)

## 2022-12-19 LAB — BRAIN NATRIURETIC PEPTIDE: B Natriuretic Peptide: 333.8 pg/mL — ABNORMAL HIGH (ref 0.0–100.0)

## 2022-12-19 MED ORDER — FUROSEMIDE 10 MG/ML IJ SOLN
40.0000 mg | Freq: Once | INTRAMUSCULAR | Status: AC
  Administered 2022-12-19: 40 mg via INTRAVENOUS
  Filled 2022-12-19: qty 4

## 2022-12-19 MED ORDER — IPRATROPIUM-ALBUTEROL 0.5-2.5 (3) MG/3ML IN SOLN
3.0000 mL | Freq: Once | RESPIRATORY_TRACT | Status: AC
  Administered 2022-12-19: 3 mL via RESPIRATORY_TRACT
  Filled 2022-12-19: qty 3

## 2022-12-19 MED ORDER — LOSARTAN POTASSIUM 25 MG PO TABS
50.0000 mg | ORAL_TABLET | Freq: Once | ORAL | Status: AC
  Administered 2022-12-19: 50 mg via ORAL
  Filled 2022-12-19: qty 2

## 2022-12-19 MED ORDER — DOXYCYCLINE HYCLATE 100 MG PO CAPS: 100.0000 mg | ORAL_CAPSULE | Freq: Two times a day (BID) | ORAL | 0 refills | Status: AC

## 2022-12-19 MED ORDER — ALBUTEROL SULFATE HFA 108 (90 BASE) MCG/ACT IN AERS: 1.0000 | INHALATION_SPRAY | Freq: Four times a day (QID) | RESPIRATORY_TRACT | 0 refills | Status: DC | PRN

## 2022-12-19 MED ORDER — FUROSEMIDE 20 MG PO TABS: 20.0000 mg | ORAL_TABLET | Freq: Every day | ORAL | 0 refills | Status: DC

## 2022-12-19 MED ORDER — PREDNISONE 10 MG (21) PO TBPK: ORAL_TABLET | Freq: Every day | ORAL | 0 refills | Status: DC

## 2022-12-19 MED ORDER — METHYLPREDNISOLONE SODIUM SUCC 125 MG IJ SOLR
62.5000 mg | Freq: Once | INTRAMUSCULAR | Status: AC
  Administered 2022-12-19: 62.5 mg via INTRAVENOUS
  Filled 2022-12-19: qty 2

## 2022-12-19 NOTE — Discharge Instructions (Addendum)
It was a pleasure caring for you today in the emergency department. ° °Please return to the emergency department for any worsening or worrisome symptoms. ° ° °

## 2022-12-19 NOTE — ED Provider Notes (Signed)
[facility] Provider Note  CSN: [id number] Arrival date & time: 12/19/22 1231  Chief Complaint(s) Shortness of Breath  HPI Ray Griffin is a 57 y.o. male with past medical history as below, significant for COPD, emphysema, hypertension,tobacco use who presents to the ED with complaint of cough, dib.  Patient reports mild exertional dyspnea over the past week, worsened when lying flat.  Has since improved. Compliant with home medications, cough with white/Chrystal Zeimet sputum. No sig chest pain. No fever or chills. Compliant with home meds, slight increase to LE swelling, no perceived weight changes. No chest pain. No home O2 use. No sick contacts   Past Medical History Past Medical History:  Diagnosis Date   Anxiety    COPD (chronic obstructive pulmonary disease) ([place])    Emphysema    Hypertension    Tachycardia    Patient Active Problem List   Diagnosis Date Noted   COVID-19 10/30/2020   Pulmonary nodules/lesions, multiple 10/01/2017   COPD exacerbation ([place]) 10/01/2017   Acute cholecystitis 12/04/2014   COPD with acute exacerbation ([place]) 12/04/2014   Gallstones 12/03/2014   SINUSITIS - ACUTE-NOS 10/17/2009   Cigarette smoker 01/08/2008   PANIC ATTACK, ACUTE 06/18/2007   ALLERGIC RHINITIS 06/18/2007   COPD GOLD III/ still smoking 06/18/2007   DEPRESSION 04/04/2007   Essential hypertension 04/04/2007   Home Medication(s) Prior to Admission medications   Medication Sig Start Date End Date Taking? Authorizing Provider  albuterol (VENTOLIN HFA) 108 (90 Base) MCG/ACT inhaler Inhale 1-2 puffs into the lungs every 6 (six) hours as needed for wheezing or shortness of breath. 12/19/22  Yes Wynona Dove A, DO  doxycycline (VIBRAMYCIN) 100 MG capsule Take 1 capsule (100 mg total) by mouth 2 (two) times daily for 7 days. 12/19/22 12/26/22 Yes Wynona Dove A, DO  furosemide (LASIX) 20 MG tablet Take 1 tablet (20 mg total) by mouth daily for 3 days.  12/20/22 12/23/22 Yes Jeanell Sparrow, DO  predniSONE (STERAPRED UNI-PAK 21 TAB) 10 MG (21) TBPK tablet Take by mouth daily. Take 6 tabs by mouth daily  for 2 days, then 5 tabs for 2 days, then 4 tabs for 2 days, then 3 tabs for 2 days, 2 tabs for 2 days, then 1 tab by mouth daily for 2 days 12/20/22  Yes Wynona Dove A, DO  chlorthalidone (HYGROTON) 25 MG tablet Take 1 tablet (25 mg total) by mouth daily. 08/08/22   Janina Mayo, MD  losartan (COZAAR) 50 MG tablet Take 1 tablet (50 mg total) by mouth daily. 08/08/22   Janina Mayo, MD  metoprolol tartrate (LOPRESSOR) 50 MG tablet Take 1 tablet (50 mg total) by mouth once for 1 dose. PLEASE TAKE METOPROLOL 2  HOURS PRIOR TO CTA SCAN. 08/08/22 08/08/22  Janina Mayo, MD  Past Surgical History Past Surgical History:  Procedure Laterality Date   CHOLECYSTECTOMY N/A 12/04/2014   Procedure: LAPAROSCOPIC CHOLECYSTECTOMY WITH POSSIBLE INTRAOPERATIVE CHOLANGIOGRAM;  Surgeon: Gayland Curry, MD;  Location: [facility];  Service: General;  Laterality: N/A;   FACIAL COSMETIC SURGERY     THORACOTOMY     Family History Family History  Problem Relation Age of Onset   Diabetes Mother    Heart failure Mother    Diabetes Father    Heart failure Father    Emphysema Father        smoked   Bipolar disorder Brother    Thyroid cancer Maternal Grandmother     Social History Social History   Tobacco Use   Smoking status: Every Day    Packs/day: 1.00    Years: 30.00    Additional pack years: 0.00    Total pack years: 30.00    Types: Cigarettes   Smokeless tobacco: Never  Vaping Use   Vaping Use: Never used  Substance Use Topics   Alcohol use: No    Comment: recently quit 3 mths ago   Drug use: No   Allergies Patient has no known allergies.  Review of Systems Review of Systems  Constitutional:  Negative for chills and fever.   HENT:  Negative for facial swelling and trouble swallowing.   Eyes:  Negative for photophobia and visual disturbance.  Respiratory:  Positive for cough and shortness of breath.   Cardiovascular:  Positive for leg swelling. Negative for chest pain and palpitations.  Gastrointestinal:  Negative for abdominal pain, nausea and vomiting.  Endocrine: Negative for polydipsia and polyuria.  Genitourinary:  Negative for difficulty urinating and hematuria.  Musculoskeletal:  Negative for gait problem and joint swelling.  Skin:  Negative for pallor and rash.  Neurological:  Negative for syncope and headaches.  Psychiatric/Behavioral:  Negative for agitation and confusion.     Physical Exam Vital Signs  I have reviewed the triage vital signs BP 139/75   Pulse 64   Temp 98.5 F (36.9 C) (Oral)   Resp 17   Ht 6\' 1"  (1.854 m)   Wt 117.9 kg   SpO2 95%   BMI 34.30 kg/m  Physical Exam Vitals and nursing note reviewed.  Constitutional:      General: He is not in acute distress.    Appearance: He is well-developed.  HENT:     Head: Normocephalic and atraumatic.     Right Ear: External ear normal.     Left Ear: External ear normal.     Mouth/Throat:     Mouth: Mucous membranes are moist.  Eyes:     General: No scleral icterus. Cardiovascular:     Rate and Rhythm: Normal rate and regular rhythm.     Pulses: Normal pulses.     Heart sounds: Normal heart sounds.  Pulmonary:     Effort: Pulmonary effort is normal. No respiratory distress.     Breath sounds: Wheezing present.     Comments: Trace wheezing b/l Abdominal:     General: Abdomen is flat.     Palpations: Abdomen is soft.     Tenderness: There is no abdominal tenderness.  Musculoskeletal:     Right lower leg: Edema present.     Left lower leg: Edema present.     Comments: 1+ bl le edema  Skin:    General: Skin is warm and dry.     Capillary Refill: Capillary refill takes less than 2 seconds.  Neurological:  Mental  Status: He is alert and oriented to person, place, and time.  Psychiatric:        Mood and Affect: Mood normal.        Behavior: Behavior normal.     ED Results and Treatments Labs (all labs ordered are listed, but only abnormal results are displayed) Labs Reviewed  BRAIN NATRIURETIC PEPTIDE - Abnormal; Notable for the following components:      Result Value   B Natriuretic Peptide 333.8 (*)    All other components within normal limits  COMPREHENSIVE METABOLIC PANEL - Abnormal; Notable for the following components:   Calcium 8.5 (*)    ALT 52 (*)    All other components within normal limits  BLOOD GAS, VENOUS - Abnormal; Notable for the following components:   pO2, Ven <31 (*)    Bicarbonate 30.5 (*)    Acid-Base Excess 5.1 (*)    All other components within normal limits  RESP PANEL BY RT-PCR (RSV, FLU A&B, COVID)  RVPGX2  CBC WITH DIFFERENTIAL/PLATELET  TROPONIN I (HIGH SENSITIVITY)  TROPONIN I (HIGH SENSITIVITY)                                                                                                                          Radiology DG Chest Port 1 View  Result Date: 12/19/2022 CLINICAL DATA:  Difficulty breathing, shortness of breath for over a week, hypoxemia EXAM: PORTABLE CHEST 1 VIEW COMPARISON:  Portable exam 1226 hours compared to 07/11/2022 FINDINGS: Upper normal heart size. Mediastinal contours and pulmonary vascularity normal. Bibasilar scarring and RIGHT upper lobe unchanged since at least 09/16/2017. No acute infiltrate, pleural effusion, or pneumothorax. No acute osseous findings. IMPRESSION: Scarring at both lung bases and at RIGHT apex. No acute abnormalities. Electronically Signed   By: Lavonia Dana M.D.   On: 12/19/2022 13:10    Pertinent labs & imaging results that were available during my care of the patient were reviewed by me and considered in my medical decision making (see MDM for details).  Medications Ordered in ED Medications  furosemide  (LASIX) injection 40 mg (40 mg Intravenous Given 12/19/22 1443)  methylPREDNISolone sodium succinate (SOLU-MEDROL) 125 mg/2 mL injection 62.5 mg (62.5 mg Intravenous Given 12/19/22 1442)  ipratropium-albuterol (DUONEB) 0.5-2.5 (3) MG/3ML nebulizer solution 3 mL (3 mLs Nebulization Given 12/19/22 1443)  losartan (COZAAR) tablet 50 mg (50 mg Oral Given 12/19/22 1524)  Procedures Procedures  (including critical care time)  Medical Decision Making / ED Course    Medical Decision Making:    TAMARCUS DROLL is a 57 y.o. male  Patient reports mild exertional dyspnea over the past week, worsened when lying flat.  Has since improved.. The complaint involves an extensive differential diagnosis and also carries with it a high risk of complications and morbidity.  Serious etiology was considered. Ddx includes but is not limited to: In my evaluation of this patient's dyspnea my DDx includes, but is not limited to, pneumonia, pulmonary embolism, pneumothorax, pulmonary edema, metabolic acidosis, asthma, COPD, cardiac cause, anemia, anxiety, etc.    Complete initial physical exam performed, notably the patient  was NAD, on ambient air, not hypoxic.    Reviewed and confirmed nursing documentation for past medical history, family history, social history.  Vital signs reviewed.    Clinical Course as of 12/19/22 1635  Mon Dec 19, 2022  1436 . Patient with COPD, trace wheezing bilateral.  DIB.  Give nebulized breathing treatment, Solu-Medrol, also give Lasix given increased lower extremity edema [SG]  1634 Patient reassessed, is feeling much better.  Labs reviewed, BNP is mildly elevated, his lower extremity edema.  Orthopnea.  Give Lasix.  Also give DuoNeb, Solu-Medrol.  Rester status greatly improved.  Ambulatory with steady gait without hypoxia or dyspnea.  Concern for COPD  exacerbation and mild exacerbation of his underlying diastolic heart failure.  Will give short course of Lasix for home, advised to follow-up with cardiologist.  Steroids, albuterol, Doxy for COPD.  Advised him to stop smoking [SG]    Clinical Course User Index [SG] Wynona Dove A, DO   Wheezing resolved, no hypoxia, overall feeling much better on recheck  The patient improved significantly and was discharged in stable condition. Detailed discussions were had with the patient regarding current findings, and need for close f/u with PCP or on call doctor. The patient has been instructed to return immediately if the symptoms worsen in any way for re-evaluation. Patient verbalized understanding and is in agreement with current care plan. All questions answered prior to discharge.      Additional history obtained: -Additional history obtained from na -External records from outside source obtained and reviewed including: Chart review including previous notes, labs, imaging, consultation notes including prior labs and imaging, prior ED visits, home medications   Lab Tests: -I ordered, reviewed, and interpreted labs.   The pertinent results include:   Labs Reviewed  BRAIN NATRIURETIC PEPTIDE - Abnormal; Notable for the following components:      Result Value   B Natriuretic Peptide 333.8 (*)    All other components within normal limits  COMPREHENSIVE METABOLIC PANEL - Abnormal; Notable for the following components:   Calcium 8.5 (*)    ALT 52 (*)    All other components within normal limits  BLOOD GAS, VENOUS - Abnormal; Notable for the following components:   pO2, Ven <31 (*)    Bicarbonate 30.5 (*)    Acid-Base Excess 5.1 (*)    All other components within normal limits  RESP PANEL BY RT-PCR (RSV, FLU A&B, COVID)  RVPGX2  CBC WITH DIFFERENTIAL/PLATELET  TROPONIN I (HIGH SENSITIVITY)  TROPONIN I (HIGH SENSITIVITY)    Notable for BNP elev slightly  EKG   EKG  Interpretation  Date/Time:    Ventricular Rate:    PR Interval:    QRS Duration:   QT Interval:    QTC Calculation:   R Axis:  Text Interpretation:           Imaging Studies ordered: I ordered imaging studies including CXR I independently visualized the following imaging with scope of interpretation limited to determining acute life threatening conditions related to emergency care: chronic changes  I independently visualized and interpreted imaging. I agree with the radiologist interpretation   Medicines ordered and prescription drug management: Meds ordered this encounter  Medications   furosemide (LASIX) injection 40 mg   methylPREDNISolone sodium succinate (SOLU-MEDROL) 125 mg/2 mL injection 62.5 mg   ipratropium-albuterol (DUONEB) 0.5-2.5 (3) MG/3ML nebulizer solution 3 mL   losartan (COZAAR) tablet 50 mg   albuterol (VENTOLIN HFA) 108 (90 Base) MCG/ACT inhaler    Sig: Inhale 1-2 puffs into the lungs every 6 (six) hours as needed for wheezing or shortness of breath.    Dispense:  1 each    Refill:  0   doxycycline (VIBRAMYCIN) 100 MG capsule    Sig: Take 1 capsule (100 mg total) by mouth 2 (two) times daily for 7 days.    Dispense:  14 capsule    Refill:  0   predniSONE (STERAPRED UNI-PAK 21 TAB) 10 MG (21) TBPK tablet    Sig: Take by mouth daily. Take 6 tabs by mouth daily  for 2 days, then 5 tabs for 2 days, then 4 tabs for 2 days, then 3 tabs for 2 days, 2 tabs for 2 days, then 1 tab by mouth daily for 2 days    Dispense:  42 tablet    Refill:  0   furosemide (LASIX) 20 MG tablet    Sig: Take 1 tablet (20 mg total) by mouth daily for 3 days.    Dispense:  3 tablet    Refill:  0    -I have reviewed the patients home medicines and have made adjustments as needed   Consultations Obtained: na   Cardiac Monitoring: The patient was maintained on a cardiac monitor.  I personally viewed and interpreted the cardiac monitored which showed an underlying rhythm  of: nsr  Social Determinants of Health:  Diagnosis or treatment significantly limited by social determinants of health: current smoker Counseled patient for approximately 2 minutes regarding smoking cessation. Discussed risks of smoking and how they applied and affected their visit here today. Patient not ready to quit at this time, however will follow up with their primary doctor when they are.   CPT code: [phone]: intermediate counseling for smoking cessation     Reevaluation: After the interventions noted above, I reevaluated the patient and found that they have improved  Co morbidities that complicate the patient evaluation  Past Medical History:  Diagnosis Date   Anxiety    COPD (chronic obstructive pulmonary disease) ([place])    Emphysema    Hypertension    Tachycardia       Dispostion: Disposition decision including need for hospitalization was considered, and patient discharged from emergency department.    Final Clinical Impression(s) / ED Diagnoses Final diagnoses:  COPD exacerbation ([place])  Acute on chronic diastolic congestive heart failure ([place])     This chart was dictated using voice recognition software.  Despite best efforts to proofread,  errors can occur which can change the documentation meaning.    Jeanell Sparrow, DO 12/19/22 1636

## 2022-12-19 NOTE — ED Triage Notes (Signed)
SOB for over a week. Pt states worse with exertion. Pt states pulse ox was 88% at home for a few minutes, then he sat up and it improved. Denies pain, Pt states he has hx of a bleb in right lung an dhad right lower lobe of lung removed. States this feels similar to when he had the bleb

## 2022-12-27 ENCOUNTER — Encounter: Payer: Self-pay | Admitting: Internal Medicine

## 2022-12-27 ENCOUNTER — Ambulatory Visit: Payer: Self-pay | Attending: Internal Medicine | Admitting: Internal Medicine

## 2022-12-27 VITALS — BP 120/80 | HR 67 | Ht 73.0 in | Wt 271.0 lb

## 2022-12-27 DIAGNOSIS — I493 Ventricular premature depolarization: Secondary | ICD-10-CM

## 2022-12-27 MED ORDER — FUROSEMIDE 20 MG PO TABS: 20.0000 mg | ORAL_TABLET | Freq: Every day | ORAL | 1 refills | Status: DC

## 2022-12-27 MED ORDER — METOPROLOL SUCCINATE ER 25 MG PO TB24: 25.0000 mg | ORAL_TABLET | Freq: Every day | ORAL | 3 refills | Status: DC

## 2022-12-27 NOTE — Progress Notes (Signed)
Cardiology Office Note:    Date:  12/27/2022   ID:  Ray Griffin, DOB [birth year], MRN [id number]  PCP:  Dorna Mai, MD   [facility] Providers Cardiologist:  Janina Mayo, MD     Referring MD: Jeanell Sparrow, DO   No chief complaint on file. SOB, palpitations  History of Present Illness:    Ray Griffin is a 57 y.o. male with a hx of anxiety, COPD, HTN, smoker,  referral from the ED for palpitations. ECG shows sinus rhythm with PVCs  He notes chronic SOB, has hx of COPD. No chest pressure or squeezing. Still smokes, but has refrained over the last few weeks.  He notes LH. Felt presyncopal prior to the ED. Drinks 2 cups of coffee  He denies syncope  Mother had CABG , HTN. Father HTN. Grandmother with CHF  Doesn't check BP at home.  Social Hx: cooks for a living.  Interim Hx 12/27/2022 Notes having some PVCs. Went to the ED, for SOB. He was SOB for 1 week. No wheezing. Noted more short of breath at work. He was desatting to 89 , 88. He took the steroid pack.He was told he had HFpEF and COPD. He notes pulse ox low at night. No PND or othopnea. BNP was 333. He was given lasix for 3 days.    Cardiology Studies In 2012, had a stress echo, 9 METs. Hypertensive responses 217/115 mmHg. EF normal. Mild LVH.  Stress echo suggested possible inducible ischemia in the septum. Cath 06/09/2011  showed normal CORs.   [facility] 08/22/2022- PVC burden 4.9%  Coronary CTA-38th percentile for age, CAC of 1  TTE 08/30/2022- normal LVEF, RV fxn, no valve abnormalities   Past Medical History:  Diagnosis Date   Anxiety    COPD (chronic obstructive pulmonary disease) (HCC)    Emphysema    Hypertension    Tachycardia     Past Surgical History:  Procedure Laterality Date   CHOLECYSTECTOMY N/A 12/04/2014   Procedure: LAPAROSCOPIC CHOLECYSTECTOMY WITH POSSIBLE INTRAOPERATIVE CHOLANGIOGRAM;  Surgeon: Gayland Curry, MD;  Location: [facility];  Service: General;  Laterality: N/A;    FACIAL COSMETIC SURGERY     THORACOTOMY      Current Medications: Current Meds  Medication Sig   albuterol (VENTOLIN HFA) 108 (90 Base) MCG/ACT inhaler Inhale 1-2 puffs into the lungs every 6 (six) hours as needed for wheezing or shortness of breath.   chlorthalidone (HYGROTON) 25 MG tablet Take 1 tablet (25 mg total) by mouth daily.   losartan (COZAAR) 50 MG tablet Take 1 tablet (50 mg total) by mouth daily.   predniSONE (STERAPRED UNI-PAK 21 TAB) 10 MG (21) TBPK tablet Take 10 mg by mouth daily.     Allergies:   Patient has no known allergies.   Social History   Socioeconomic History   Marital status: Single    Spouse name: Not on file   Number of children: Not on file   Years of education: Not on file   Highest education level: Not on file  Occupational History   Not on file  Tobacco Use   Smoking status: Every Day    Packs/day: 1.00    Years: 30.00    Additional pack years: 0.00    Total pack years: 30.00    Types: Cigarettes   Smokeless tobacco: Never  Vaping Use   Vaping Use: Never used  Substance and Sexual Activity   Alcohol use: No    Comment: recently quit  3 mths ago   Drug use: No   Sexual activity: Not on file  Other Topics Concern   Not on file  Social History Narrative   Not on file   Social Determinants of Health   Financial Resource Strain: Medium Risk (08/08/2022)   Overall Financial Resource Strain (CARDIA)    Difficulty of Paying Living Expenses: Somewhat hard  Food Insecurity: No Food Insecurity (08/08/2022)   Hunger Vital Sign    Worried About Running Out of Food in the Last Year: Never true    Ran Out of Food in the Last Year: Never true  Transportation Needs: No Transportation Needs (08/08/2022)   PRAPARE - Hydrologist (Medical): No    Lack of Transportation (Non-Medical): No  Physical Activity: Not on file  Stress: Not on file  Social Connections: Not on file     Family History: The patient's family  history includes Bipolar disorder in his brother; Diabetes in his father and mother; Emphysema in his father; Heart failure in his father and mother; Thyroid cancer in his maternal grandmother.  ROS:   Please see the history of present illness.     All other systems reviewed and are negative.  EKGs/Labs/Other Studies Reviewed:    The following studies were reviewed today:   EKG:  EKG is  ordered today.  The ekg ordered today demonstrates   08/08/2022- NSR, PVCs  12/27/2022- NSR, PVCs inferior axis, right axis  Recent Labs: 07/11/2022: Magnesium 2.0 12/19/2022: ALT 52; B Natriuretic Peptide 333.8; BUN 12; Creatinine, Ser 0.91; Hemoglobin 15.6; Platelets 250; Potassium 3.8; Sodium 139   Recent Lipid Panel No results found for: "CHOL", "TRIG", "HDL", "CHOLHDL", "VLDL", "LDLCALC", "LDLDIRECT"   Risk Assessment/Calculations:     Physical Exam:    VS:   Vitals:   12/27/22 1011  BP: 120/80  Pulse: 67  SpO2: 95%    Wt Readings from Last 3 Encounters:  12/19/22 260 lb (117.9 kg)  08/08/22 271 lb (122.9 kg)  07/11/22 270 lb (122.5 kg)     GEN:  Well nourished, well developed in no acute distress HEENT: Normal NECK: No JVD; No carotid bruits LYMPHATICS: No lymphadenopathy CARDIAC: RRR, no murmurs, rubs, gallops RESPIRATORY:  Clear to auscultation without rales, wheezing or rhonchi  ABDOMEN: Soft, non-tender, non-distended MUSCULOSKELETAL:  No edema; No deformity  SKIN: Warm and dry NEUROLOGIC:  Alert and oriented x 3 PSYCHIATRIC:  Normal affect   ASSESSMENT:    Palpitations: PVC 4.6% burden.  Start metop 25 mg XL. Unlikely ischemic with coronary CTA showing low CAC  HFpEF: had an episode of  SOB, no florid CHF, but can have some elevated LVEDP. Can continue lasix 20 mg daily.  HTN: well controlled.  Stopped combo pill lisinopril 20-12.5 Hctz. Started Chlorthalidone 25 mg daily, losartan 50 mg daily PLAN:    In order of problems listed above:  Start metop 25 mg XL  daily Start lasix 20 mg daily Follow up 6 months      Medication Adjustments/Labs and Tests Ordered: Current medicines are reviewed at length with the patient today.  Concerns regarding medicines are outlined above.  No orders of the defined types were placed in this encounter.  No orders of the defined types were placed in this encounter.   There are no Patient Instructions on file for this visit.   Signed, Janina Mayo, MD  12/27/2022 10:17 AM    [place]

## 2022-12-27 NOTE — Patient Instructions (Signed)
Medication Instructions:  Start Metoprolol Succinate 25 MG once daily Start Lasix (Furosemide) 20 MG once daily  *If you need a refill on your cardiac medications before your next appointment, please call your pharmacy*   Lab Work: None ordered  If you have labs (blood work) drawn today and your tests are completely normal, you will receive your results only by: [place] (if you have MyChart) OR A paper copy in the mail If you have any lab test that is abnormal or we need to change your treatment, we will call you to review the results.   Testing/Procedures: None ordered   Follow-Up: At [facility] you and your health needs are our priority.  As part of our continuing mission to provide you with exceptional heart care, we have created designated Provider Care Teams.  These Care Teams include your primary Cardiologist (physician) and Advanced Practice Providers (APPs -  Physician Assistants and Nurse Practitioners) who all work together to provide you with the care you need, when you need it.  We recommend signing up for the patient portal called "MyChart".  Sign up information is provided on this After Visit Summary.  MyChart is used to connect with patients for Virtual Visits (Telemedicine).  Patients are able to view lab/test results, encounter notes, upcoming appointments, etc.  Non-urgent messages can be sent to your provider as well.   To learn more about what you can do with MyChart, go to [email or web address].    Your next appointment:   6 month(s)  Provider:   Janina Mayo, MD     Other Instructions

## 2022-12-28 ENCOUNTER — Telehealth: Payer: Self-pay | Admitting: Licensed Clinical Social Worker

## 2022-12-28 NOTE — Telephone Encounter (Signed)
H&V Care Navigation CSW Progress Note  Clinical Social Worker notes pt still uninsured during visit to [facility] w/ Dr. Harl Bowie. LCSW has sent pt another Medicaid expansion flyer and [employer] Assistance as previous hospital bills still eligible for assistance, pt income likely eligible for Medicaid expansion income and if not likely eligible for CAFA. My card included should pt wish to re-engage with care management.   Patient is participating in a Managed Medicaid Plan:  No, self pay only.  SDOH Screenings   Food Insecurity: No Food Insecurity (08/08/2022)  Housing: Low Risk  (08/08/2022)  Transportation Needs: No Transportation Needs (08/08/2022)  Utilities: Not At Risk (08/08/2022)  Alcohol Screen: Low Risk  (08/08/2022)  Depression (PHQ2-9): High Risk (06/22/2021)  Financial Resource Strain: Medium Risk (08/08/2022)  Tobacco Use: High Risk (12/27/2022)   Ray Griffin, MSW, [place]  [phone]- work cell phone (preferred) [phone]- desk phone

## 2022-12-30 ENCOUNTER — Encounter (HOSPITAL_COMMUNITY): Payer: Self-pay

## 2022-12-30 ENCOUNTER — Emergency Department (HOSPITAL_COMMUNITY)
Admission: EM | Admit: 2022-12-30 | Discharge: 2022-12-31 | Disposition: A | Discharge status: Home / Self Care | Payer: Self-pay | Attending: Emergency Medicine | Admitting: Emergency Medicine

## 2022-12-30 ENCOUNTER — Other Ambulatory Visit: Payer: Self-pay

## 2022-12-30 ENCOUNTER — Emergency Department (HOSPITAL_COMMUNITY): Payer: Self-pay

## 2022-12-30 ENCOUNTER — Telehealth: Payer: Self-pay | Admitting: Internal Medicine

## 2022-12-30 DIAGNOSIS — R0602 Shortness of breath: Secondary | ICD-10-CM | POA: Insufficient documentation

## 2022-12-30 DIAGNOSIS — I509 Heart failure, unspecified: Secondary | ICD-10-CM | POA: Insufficient documentation

## 2022-12-30 DIAGNOSIS — R0600 Dyspnea, unspecified: Secondary | ICD-10-CM | POA: Insufficient documentation

## 2022-12-30 DIAGNOSIS — Z1152 Encounter for screening for COVID-19: Secondary | ICD-10-CM | POA: Insufficient documentation

## 2022-12-30 LAB — BASIC METABOLIC PANEL
Anion gap: 10 (ref 5–15)
BUN: 26 mg/dL — ABNORMAL HIGH (ref 6–20)
CO2: 25 mmol/L (ref 22–32)
Calcium: 8.3 mg/dL — ABNORMAL LOW (ref 8.9–10.3)
Chloride: 94 mmol/L — ABNORMAL LOW (ref 98–111)
Creatinine, Ser: 0.92 mg/dL (ref 0.61–1.24)
GFR, Estimated: >60 mL/min (ref >60)
Glucose, Bld: 114 mg/dL — ABNORMAL HIGH (ref 70–99)
Potassium: 3.3 mmol/L — ABNORMAL LOW (ref 3.5–5.1)
Sodium: 129 mmol/L — ABNORMAL LOW (ref 135–145)

## 2022-12-30 LAB — RESP PANEL BY RT-PCR (RSV, FLU A&B, COVID)  RVPGX2
Influenza A by PCR: NEGATIVE
Influenza B by PCR: NEGATIVE
Resp Syncytial Virus by PCR: NEGATIVE
SARS Coronavirus 2 by RT PCR: NEGATIVE

## 2022-12-30 LAB — BRAIN NATRIURETIC PEPTIDE: B Natriuretic Peptide: 55.1 pg/mL (ref 0.0–100.0)

## 2022-12-30 LAB — LACTIC ACID, PLASMA: Lactic Acid, Venous: 1.2 mmol/L (ref 0.5–1.9)

## 2022-12-30 MED ORDER — LACTATED RINGERS IV SOLN: INTRAVENOUS | Status: DC

## 2022-12-30 NOTE — ED Triage Notes (Signed)
Pt with hx of new diagnosis of CHF comes in with feeling more foggy, and weakness. He also has a hx of pneumonia too. He did just finish some doxycycline but he states he is not aware what it was for. He attributed this foggy brain and weakness from starting the Lasix. He was at work today but wasn't able to fully do his job.

## 2022-12-30 NOTE — ED Provider Notes (Signed)
11:23 PM Assumed care from Dr. Zenia Resides, please see their note for full history, physical and decision making until this point. In brief this is a 57 y.o. year old male who presented to the ED tonight with Shortness of Breath     Likely pneumonia vs less likely PE. Getting ct to eval. Reeval for dispo.   Discharge instructions, including strict return precautions for new or worsening symptoms, given. Patient and/or family verbalized understanding and agreement with the plan as described.   Labs, studies and imaging reviewed by myself and considered in medical decision making if ordered. Imaging interpreted by radiology.  Labs Reviewed  BASIC METABOLIC PANEL - Abnormal; Notable for the following components:      Result Value   Sodium 129 (*)    Potassium 3.3 (*)    Chloride 94 (*)    Glucose, Bld 114 (*)    BUN 26 (*)    Calcium 8.3 (*)    All other components within normal limits  RESP PANEL BY RT-PCR (RSV, FLU A&B, COVID)  RVPGX2  CULTURE, BLOOD (ROUTINE X 2)  CULTURE, BLOOD (ROUTINE X 2)  BRAIN NATRIURETIC PEPTIDE  LACTIC ACID, PLASMA  CBC WITH DIFFERENTIAL/PLATELET  LACTIC ACID, PLASMA    DG Chest Port 1 View  Final Result    CT Angio Chest PE W/Cm &/Or Wo Cm    (Results Pending)    No follow-ups on file.

## 2022-12-30 NOTE — ED Provider Notes (Signed)
[facility] Provider Note   CSN: [id number] Arrival date & time: 12/30/22  2105     History  Chief Complaint  Patient presents with   Shortness of Breath    Ray Griffin is a 57 y.o. male.  57 year old male presents with increased shortness of breath and cough times several days.  History of CHF and does note lower extremity edema increased.  No chest pain or chest pressure.  Some orthopnea.  Has endorsed some chills.  No vomiting or diarrhea.  Notes at home that his pulse ox has been in the high 80s.  Does not use oxygen.  Given tonight because of severe dyspnea.  Denies any history of blood loss.  She also just completed a course of doxycycline for an unknown reason.  Take Lasix daily       Home Medications Prior to Admission medications   Medication Sig Start Date End Date Taking? Authorizing Provider  albuterol (VENTOLIN HFA) 108 (90 Base) MCG/ACT inhaler Inhale 1-2 puffs into the lungs every 6 (six) hours as needed for wheezing or shortness of breath. 12/19/22   Jeanell Sparrow, DO  chlorthalidone (HYGROTON) 25 MG tablet Take 1 tablet (25 mg total) by mouth daily. 08/08/22   Janina Mayo, MD  furosemide (LASIX) 20 MG tablet Take 1 tablet (20 mg total) by mouth daily. 12/27/22   Janina Mayo, MD  losartan (COZAAR) 50 MG tablet Take 1 tablet (50 mg total) by mouth daily. 08/08/22   Janina Mayo, MD  metoprolol succinate (TOPROL-XL) 25 MG 24 hr tablet Take 1 tablet (25 mg total) by mouth daily. Take with or immediately following a meal. 12/27/22   Janina Mayo, MD  predniSONE (STERAPRED UNI-PAK 21 TAB) 10 MG (21) TBPK tablet Take 10 mg by mouth daily.    [provider]      Allergies    Patient has no known allergies.    Review of Systems   Review of Systems  All other systems reviewed and are negative.   Physical Exam Updated Vital Signs BP 136/69 (BP Location: Right Arm)   Pulse 95   Temp 100.3 F (37.9 C)  (Oral)   Resp 20   SpO2 96%  Physical Exam Vitals and nursing note reviewed.  Constitutional:      General: He is not in acute distress.    Appearance: Normal appearance. He is well-developed. He is not toxic-appearing.  HENT:     Head: Normocephalic and atraumatic.  Eyes:     General: Lids are normal.     Conjunctiva/sclera: Conjunctivae normal.     Pupils: Pupils are equal, round, and reactive to light.  Neck:     Thyroid: No thyroid mass.     Trachea: No tracheal deviation.  Cardiovascular:     Rate and Rhythm: Normal rate and regular rhythm.     Heart sounds: Normal heart sounds. No murmur heard.    No gallop.  Pulmonary:     Effort: Tachypnea present.     Breath sounds: Decreased air movement present. No stridor. Examination of the right-upper field reveals decreased breath sounds. Examination of the left-upper field reveals decreased breath sounds. Decreased breath sounds present. No wheezing, rhonchi or rales.  Abdominal:     General: There is no distension.     Palpations: Abdomen is soft.     Tenderness: There is no abdominal tenderness. There is no rebound.  Musculoskeletal:  General: No tenderness. Normal range of motion.     Cervical back: Normal range of motion and neck supple.  Skin:    General: Skin is warm and dry.     Findings: No abrasion or rash.  Neurological:     Mental Status: He is alert and oriented to person, place, and time. Mental status is at baseline.     GCS: GCS eye subscore is 4. GCS verbal subscore is 5. GCS motor subscore is 6.     Cranial Nerves: No cranial nerve deficit.     Sensory: No sensory deficit.     Motor: Motor function is intact.  Psychiatric:        Attention and Perception: Attention normal.        Speech: Speech normal.        Behavior: Behavior normal.     ED Results / Procedures / Treatments   Labs (all labs ordered are listed, but only abnormal results are displayed) Labs Reviewed  CULTURE, BLOOD (ROUTINE X  2)  CULTURE, BLOOD (ROUTINE X 2)  RESP PANEL BY RT-PCR (RSV, FLU A&B, COVID)  RVPGX2  CBC WITH DIFFERENTIAL/PLATELET  BASIC METABOLIC PANEL  BRAIN NATRIURETIC PEPTIDE  LACTIC ACID, PLASMA  LACTIC ACID, PLASMA    EKG EKG Interpretation  Date/Time:  Friday December 30 2022 21:12:13 EDT Ventricular Rate:  91 PR Interval:  141 QRS Duration: 95 QT Interval:  346 QTC Calculation: 426 R Axis:   94 Text Interpretation: Sinus rhythm Atrial premature complexes Borderline right axis deviation Confirmed by Lacretia Leigh ([id number]) on 12/30/2022 9:23:37 PM  Radiology No results found.  Procedures Procedures    Medications Ordered in ED Medications  lactated ringers infusion (has no administration in time range)    ED Course/ Medical Decision Making/ A&P                             Medical Decision Making Amount and/or Complexity of Data Reviewed Labs: ordered. Radiology: ordered. ECG/medicine tests: ordered.  Risk Prescription drug management.   Patient's chest x-ray per my interpretation shows no acute infiltrates.  He does have a low-grade temperature here.  BNP is normal.  Suspicions for possible CHF.  With a low-grade temperature however feel like he likely has pneumonia.  Possible PE.  CT angio chest is pending at this time.  Lactate normal.  Does not meet sepsis criteria at this time.  COVID and flu are pending as well to.  Will sign off next divider        Final Clinical Impression(s) / ED Diagnoses Final diagnoses:  None    Rx / DC Orders ED Discharge Orders     None         Lacretia Leigh, MD 12/30/22 2320

## 2022-12-30 NOTE — Telephone Encounter (Signed)
Spoke with patient of Dr. Harl Bowie who reports his neck feels weird, phlegm in the morning with AM cough. The coughing persists during the night too. He also reports pain in middle toes on his left foot when he gets home. He has known COPD. He only has PRN albuterol, no daily maintenance therapy. He does not have a pulmonologist right now. Advised he go to urgent care for work up - possible COPD exacerbation, infectious process(?)

## 2022-12-30 NOTE — Telephone Encounter (Signed)
Pt called in stating his neck feels "weird" . He states his O2 sat 95%  and his hr was 87 today. Pt states he's been having a bad cough with phlegm in the mornings and he just really doesn't feel well. He would like some suggestions. Please advise.

## 2022-12-31 ENCOUNTER — Emergency Department (HOSPITAL_COMMUNITY): Payer: Self-pay

## 2022-12-31 LAB — CBC WITH DIFFERENTIAL/PLATELET
Abs Immature Granulocytes: 0.12 10*3/uL — ABNORMAL HIGH (ref 0.00–0.07)
Basophils Absolute: 0 10*3/uL (ref 0.0–0.1)
Basophils Relative: 0 %
Eosinophils Absolute: 0 10*3/uL (ref 0.0–0.5)
Eosinophils Relative: 0 %
HCT: 50.4 % (ref 39.0–52.0)
Hemoglobin: 16.9 g/dL (ref 13.0–17.0)
Immature Granulocytes: 1 %
Lymphocytes Relative: 5 %
Lymphs Abs: 0.7 10*3/uL (ref 0.7–4.0)
MCH: 29.1 pg (ref 26.0–34.0)
MCHC: 33.5 g/dL (ref 30.0–36.0)
MCV: 86.9 fL (ref 80.0–100.0)
Monocytes Absolute: 0.8 10*3/uL (ref 0.1–1.0)
Monocytes Relative: 5 %
Neutro Abs: 13.7 10*3/uL — ABNORMAL HIGH (ref 1.7–7.7)
Neutrophils Relative %: 89 %
Platelets: 249 10*3/uL (ref 150–400)
RBC: 5.8 MIL/uL (ref 4.22–5.81)
RDW: 13.2 % (ref 11.5–15.5)
WBC: 15.4 10*3/uL — ABNORMAL HIGH (ref 4.0–10.5)
nRBC: 0 % (ref 0.0–0.2)

## 2022-12-31 LAB — LACTIC ACID, PLASMA: Lactic Acid, Venous: 1.8 mmol/L (ref 0.5–1.9)

## 2022-12-31 MED ORDER — ALBUTEROL SULFATE HFA 108 (90 BASE) MCG/ACT IN AERS
2.0000 | INHALATION_SPRAY | Freq: Once | RESPIRATORY_TRACT | Status: AC
  Administered 2022-12-31: 2 via RESPIRATORY_TRACT
  Filled 2022-12-31: qty 6.7

## 2022-12-31 MED ORDER — IOHEXOL 350 MG/ML SOLN
75.0000 mL | Freq: Once | INTRAVENOUS | Status: AC | PRN
  Administered 2022-12-31: 75 mL via INTRAVENOUS

## 2022-12-31 MED ORDER — PREDNISONE 20 MG PO TABS
60.0000 mg | ORAL_TABLET | Freq: Once | ORAL | Status: AC
  Administered 2022-12-31: 60 mg via ORAL
  Filled 2022-12-31: qty 3

## 2022-12-31 MED ORDER — PREDNISONE 20 MG PO TABS: ORAL_TABLET | ORAL | 0 refills | Status: DC

## 2022-12-31 MED ORDER — AZITHROMYCIN 250 MG PO TABS
500.0000 mg | ORAL_TABLET | Freq: Every day | ORAL | Status: DC
  Administered 2022-12-31: 500 mg via ORAL
  Filled 2022-12-31: qty 2

## 2022-12-31 MED ORDER — ALBUTEROL SULFATE HFA 108 (90 BASE) MCG/ACT IN AERS: 1.0000 | INHALATION_SPRAY | Freq: Four times a day (QID) | RESPIRATORY_TRACT | 1 refills | Status: AC | PRN

## 2022-12-31 MED ORDER — AZITHROMYCIN 250 MG PO TABS: 250.0000 mg | ORAL_TABLET | Freq: Every day | ORAL | 0 refills | Status: DC

## 2023-01-04 LAB — CULTURE, BLOOD (ROUTINE X 2)
Culture: NO GROWTH
Culture: NO GROWTH
Special Requests: ADEQUATE
Special Requests: ADEQUATE

## 2023-06-18 ENCOUNTER — Other Ambulatory Visit: Payer: Self-pay | Admitting: Internal Medicine

## 2023-06-27 ENCOUNTER — Ambulatory Visit: Payer: Self-pay | Admitting: Internal Medicine

## 2023-08-10 ENCOUNTER — Encounter (HOSPITAL_COMMUNITY): Payer: Self-pay

## 2023-08-10 ENCOUNTER — Emergency Department (HOSPITAL_COMMUNITY)
Admission: EM | Admit: 2023-08-10 | Discharge: 2023-08-10 | Disposition: A | Discharge status: Home / Self Care | Payer: Self-pay | Attending: Emergency Medicine | Admitting: Emergency Medicine

## 2023-08-10 ENCOUNTER — Emergency Department (HOSPITAL_COMMUNITY): Payer: Self-pay

## 2023-08-10 DIAGNOSIS — I509 Heart failure, unspecified: Secondary | ICD-10-CM

## 2023-08-10 DIAGNOSIS — I110 Hypertensive heart disease with heart failure: Secondary | ICD-10-CM | POA: Insufficient documentation

## 2023-08-10 DIAGNOSIS — Z1152 Encounter for screening for COVID-19: Secondary | ICD-10-CM | POA: Insufficient documentation

## 2023-08-10 DIAGNOSIS — J441 Chronic obstructive pulmonary disease with (acute) exacerbation: Secondary | ICD-10-CM

## 2023-08-10 DIAGNOSIS — Z79899 Other long term (current) drug therapy: Secondary | ICD-10-CM | POA: Insufficient documentation

## 2023-08-10 LAB — RESP PANEL BY RT-PCR (RSV, FLU A&B, COVID)  RVPGX2
Influenza A by PCR: NEGATIVE
Influenza B by PCR: NEGATIVE
Resp Syncytial Virus by PCR: NEGATIVE
SARS Coronavirus 2 by RT PCR: NEGATIVE

## 2023-08-10 LAB — MAGNESIUM: Magnesium: 2 mg/dL (ref 1.7–2.4)

## 2023-08-10 LAB — CBC WITH DIFFERENTIAL/PLATELET
Abs Immature Granulocytes: 0.04 10*3/uL (ref 0.00–0.07)
Basophils Absolute: 0 10*3/uL (ref 0.0–0.1)
Basophils Relative: 0 %
Eosinophils Absolute: 0.2 10*3/uL (ref 0.0–0.5)
Eosinophils Relative: 2 %
HCT: 49.7 % (ref 39.0–52.0)
Hemoglobin: 16.1 g/dL (ref 13.0–17.0)
Immature Granulocytes: 0 %
Lymphocytes Relative: 16 %
Lymphs Abs: 1.6 10*3/uL (ref 0.7–4.0)
MCH: 28 pg (ref 26.0–34.0)
MCHC: 32.4 g/dL (ref 30.0–36.0)
MCV: 86.6 fL (ref 80.0–100.0)
Monocytes Absolute: 0.6 10*3/uL (ref 0.1–1.0)
Monocytes Relative: 6 %
Neutro Abs: 7.4 10*3/uL (ref 1.7–7.7)
Neutrophils Relative %: 76 %
Platelets: 270 10*3/uL (ref 150–400)
RBC: 5.74 MIL/uL (ref 4.22–5.81)
RDW: 13 % (ref 11.5–15.5)
WBC: 9.8 10*3/uL (ref 4.0–10.5)
nRBC: 0 % (ref 0.0–0.2)

## 2023-08-10 LAB — COMPREHENSIVE METABOLIC PANEL
ALT: 48 U/L — ABNORMAL HIGH (ref 0–44)
AST: 34 U/L (ref 15–41)
Albumin: 4.1 g/dL (ref 3.5–5.0)
Alkaline Phosphatase: 119 U/L (ref 38–126)
Anion gap: 12 (ref 5–15)
BUN: 13 mg/dL (ref 6–20)
CO2: 25 mmol/L (ref 22–32)
Calcium: 8.9 mg/dL (ref 8.9–10.3)
Chloride: 100 mmol/L (ref 98–111)
Creatinine, Ser: 0.78 mg/dL (ref 0.61–1.24)
GFR, Estimated: >60 mL/min (ref >60)
Glucose, Bld: 101 mg/dL — ABNORMAL HIGH (ref 70–99)
Potassium: 4.1 mmol/L (ref 3.5–5.1)
Sodium: 137 mmol/L (ref 135–145)
Total Bilirubin: 0.8 mg/dL (ref <1.2)
Total Protein: 7.2 g/dL (ref 6.5–8.1)

## 2023-08-10 LAB — BRAIN NATRIURETIC PEPTIDE: B Natriuretic Peptide: 53.7 pg/mL (ref 0.0–100.0)

## 2023-08-10 LAB — TROPONIN I (HIGH SENSITIVITY)
Troponin I (High Sensitivity): 6 ng/L (ref <18)
Troponin I (High Sensitivity): 7 ng/L (ref <18)

## 2023-08-10 MED ORDER — AMOXICILLIN-POT CLAVULANATE 875-125 MG PO TABS
1.0000 | ORAL_TABLET | Freq: Two times a day (BID) | ORAL | 0 refills | Status: DC
Start: 2023-08-10 — End: 2023-10-15

## 2023-08-10 MED ORDER — PREDNISONE 20 MG PO TABS: 40.0000 mg | ORAL_TABLET | Freq: Every day | ORAL | 0 refills | Status: AC

## 2023-08-10 MED ORDER — FUROSEMIDE 40 MG PO TABS
60.0000 mg | ORAL_TABLET | Freq: Once | ORAL | Status: AC
  Administered 2023-08-10: 60 mg via ORAL
  Filled 2023-08-10: qty 2

## 2023-08-10 NOTE — Discharge Instructions (Signed)
You likely have COPD exacerbation.  Sent antibiotic and prednisone into the pharmacy for you.  He also received a one-time dose of Lasix in the emergency department.  Continue taking your home dose Lasix.  Let your cardiology know about your leg swelling.  Follow-up with your primary care provider.

## 2023-08-10 NOTE — ED Notes (Signed)
Pt discharged home. Discharge information discussed. No s/s of distress observed during discharge. 

## 2023-08-10 NOTE — ED Triage Notes (Addendum)
Pt presents with c/o cough and nasal congestion for several weeks. Pt reports he has been using OTC medications with no relief. Pt reports he does have a hx of CHF and wants to make sure that is not worsening at this time.

## 2023-08-10 NOTE — ED Provider Notes (Signed)
[place] EMERGENCY DEPARTMENT AT [facility] Provider Note   CSN: [id number] Arrival date & time: 08/10/23  0815     History  Chief Complaint  Patient presents with   Cough   Nasal Congestion    Ray Griffin is a 57 y.o. male.  57 year old male presents today for concern of productive cough ongoing for the past 2 to 3 weeks.  Initially he had other URI symptoms.  He states last night he was sleeping upright.  Otherwise he has a two-pillow chronic orthopnea.  Has been taking his Lasix as prescribed.  Denies chest pain, fever.  He is concerned about pneumonia and worsening CHF.  Does have history of COPD as well but denies wheezing.  The history is provided by the patient. No language interpreter was used.       Home Medications Prior to Admission medications   Medication Sig Start Date End Date Taking? Authorizing Provider  albuterol (VENTOLIN HFA) 108 (90 Base) MCG/ACT inhaler Inhale 1-2 puffs into the lungs every 6 (six) hours as needed for wheezing or shortness of breath (or cough). 12/31/22   Mesner, Barbara Cower, MD  azithromycin (ZITHROMAX) 250 MG tablet Take 1 tablet (250 mg total) by mouth daily. Take first 2 tablets together, then 1 every day until finished. 01/01/23   Mesner, Barbara Cower, MD  chlorthalidone (HYGROTON) 25 MG tablet Take 1 tablet (25 mg total) by mouth daily. 08/08/22   Maisie Fus, MD  furosemide (LASIX) 20 MG tablet TAKE 1 TABLET BY MOUTH EVERY DAY 06/19/23   Maisie Fus, MD  losartan (COZAAR) 50 MG tablet Take 1 tablet (50 mg total) by mouth daily. 08/08/22   Maisie Fus, MD  metoprolol succinate (TOPROL-XL) 25 MG 24 hr tablet Take 1 tablet (25 mg total) by mouth daily. Take with or immediately following a meal. 12/27/22   Maisie Fus, MD  predniSONE (DELTASONE) 20 MG tablet 2 tabs po daily x 4 days 01/01/23   Mesner, Barbara Cower, MD      Allergies    Patient has no known allergies.    Review of Systems   Review of Systems  Constitutional:   Negative for chills and fever.  Respiratory:  Positive for cough and shortness of breath.   Cardiovascular:  Positive for leg swelling. Negative for chest pain and palpitations.  Gastrointestinal:  Negative for abdominal pain, nausea and vomiting.  Neurological:  Negative for light-headedness.  All other systems reviewed and are negative.   Physical Exam Updated Vital Signs BP (!) 156/94 (BP Location: Right Arm)   Pulse 60   Temp 98.2 F (36.8 C) (Oral)   Resp 19   Ht 6\' 1"  (1.854 m)   Wt 117.9 kg   SpO2 98%   BMI 34.30 kg/m  Physical Exam Vitals and nursing note reviewed.  Constitutional:      General: He is not in acute distress.    Appearance: Normal appearance. He is not ill-appearing.  HENT:     Head: Normocephalic and atraumatic.     Nose: Nose normal.  Eyes:     Conjunctiva/sclera: Conjunctivae normal.  Cardiovascular:     Rate and Rhythm: Normal rate and regular rhythm.  Pulmonary:     Effort: Pulmonary effort is normal. No respiratory distress.  Musculoskeletal:        General: No deformity.     Right lower leg: Edema present.     Left lower leg: Edema present.  Skin:    Findings: No  rash.  Neurological:     Mental Status: He is alert.     ED Results / Procedures / Treatments   Labs (all labs ordered are listed, but only abnormal results are displayed) Labs Reviewed  COMPREHENSIVE METABOLIC PANEL - Abnormal; Notable for the following components:      Result Value   Glucose, Bld 101 (*)    ALT 48 (*)    All other components within normal limits  RESP PANEL BY RT-PCR (RSV, FLU A&B, COVID)  RVPGX2  CBC WITH DIFFERENTIAL/PLATELET  MAGNESIUM  BRAIN NATRIURETIC PEPTIDE  TROPONIN I (HIGH SENSITIVITY)  TROPONIN I (HIGH SENSITIVITY)    EKG None  Radiology No results found.  Procedures Procedures    Medications Ordered in ED Medications - No data to display  ED Course/ Medical Decision Making/ A&P                                 Medical  Decision Making Amount and/or Complexity of Data Reviewed Labs: ordered. Radiology: ordered.   Medical Decision Making / ED Course   This patient presents to the ED for concern of shortness of breath, cough, this involves an extensive number of treatment options, and is a complaint that carries with it a high risk of complications and morbidity.  The differential diagnosis includes CHF exacerbation, COPD exacerbation, pneumonia, PE, ACS  MDM: 57 year old male presents today for concern of persistent cough.  Previously had URI symptoms but those resolved.  States worsening swelling in both legs but states this is likely due to sleeping upright.   CBC is unremarkable.  CMP with preserved renal function and normal electrolytes.  Respiratory panel negative for COVID, flu, RSV.  Magnesium within normal.  Troponin negative.  And he is without chest pain.  No suspicion for ACS.  Chest x-ray without acute cardiopulmonary process.  Chronic emphysematous changes.  EKG without acute ischemic changes.  Will treat for COPD exacerbation.  Given the worsening peripheral edema will provide one-time dose of Lasix.  Patient did not have an IV.  He was given a p.o. dose.  He was given 60 mg.  Discharged in stable condition.  Return precaution discussed.  He ambulated in the emergency department without difficulty.  Lab Tests: -I ordered, reviewed, and interpreted labs.   The pertinent results include:   Labs Reviewed  COMPREHENSIVE METABOLIC PANEL - Abnormal; Notable for the following components:      Result Value   Glucose, Bld 101 (*)    ALT 48 (*)    All other components within normal limits  RESP PANEL BY RT-PCR (RSV, FLU A&B, COVID)  RVPGX2  CBC WITH DIFFERENTIAL/PLATELET  MAGNESIUM  BRAIN NATRIURETIC PEPTIDE  TROPONIN I (HIGH SENSITIVITY)  TROPONIN I (HIGH SENSITIVITY)      EKG  EKG Interpretation Date/Time:    Ventricular Rate:    PR Interval:    QRS Duration:    QT Interval:    QTC  Calculation:   R Axis:      Text Interpretation:           Imaging Studies ordered: I ordered imaging studies including cxr I independently visualized and interpreted imaging. I agree with the radiologist interpretation   Medicines ordered and prescription drug management: No orders of the defined types were placed in this encounter.   -I have reviewed the patients home medicines and have made adjustments as needed   Reevaluation: After the  interventions noted above, I reevaluated the patient and found that they have :improved  Co morbidities that complicate the patient evaluation  Past Medical History:  Diagnosis Date   Anxiety    COPD (chronic obstructive pulmonary disease) (HCC)    Emphysema    Hypertension    Tachycardia       Dispostion: Discharged in stable condition.  Return precaution discussed.  Patient voices understanding and is in agreement with the plan.  Final Clinical Impression(s) / ED Diagnoses Final diagnoses:  COPD exacerbation (HCC)  Congestive heart failure, unspecified HF chronicity, unspecified heart failure type [facility])    Rx / DC Orders ED Discharge Orders          Ordered    amoxicillin-clavulanate (AUGMENTIN) 875-125 MG tablet  Every 12 hours        08/10/23 1345    predniSONE (DELTASONE) 20 MG tablet  Daily with breakfast        08/10/23 1345              Marita Kansas, PA-C 08/10/23 1429    Loetta Rough, MD 08/14/23 [phone]

## 2023-08-11 ENCOUNTER — Telehealth: Payer: Self-pay | Admitting: Internal Medicine

## 2023-08-11 ENCOUNTER — Ambulatory Visit: Payer: Self-pay | Admitting: Cardiology

## 2023-08-11 NOTE — Telephone Encounter (Signed)
Patient identification verified by 2 forms. Marilynn Rail, RN   Called and spoke to patient  Patient states:   -Heart rate remains elevated, right now 106  -HR has consistency been elevated above 105   -When walking/doing activities HR is elevated above 100   -right now after sitting for this call HR decreased to 97   -Last night when attempting to sleep it was elevated above 100   -main concern is heart rate seems to be jumping around more than usual   -unsure if elevated HR related to prednisone and Augmentin he started yesterday   -continues to take metoprolol daily   -took normal dose of Lasix this morning, 20mg  tablet   -has SOB/difficulty breathing that is a little worse than his baseline   -O2 is 96% on room air  Patient denies:   -Chest pain  Informed patient RN spoke to DOD Dr. Royann Shivers, provider agrees elevated HR likely due to infection and prednisone use  RN advised patient: -to continue monitor symptoms at home  -outreach if symptoms change or worsen  -keep 11/19 follow up  Reviewed strict ED warning signs/precautions  Patient verbalized understanding, no questions at this time

## 2023-08-11 NOTE — Telephone Encounter (Signed)
Patient is calling back to follow up on what he should do. He reports his HR 10 mins ago when last checked was 116. He reports he is still at work trying to make it through the day.  Please advise.

## 2023-08-11 NOTE — Telephone Encounter (Signed)
STAT if HR is under 50 or over 120 (normal HR is 60-100 beats per minute)  What is your heart rate? 105-110 consistently  Do you have a log of your heart rate readings (document readings)? No  Do you have any other symptoms? Feels very shaky, hot, coughing clear stuff up, headache and lightheadedness.

## 2023-08-11 NOTE — Telephone Encounter (Signed)
Patient is calling in with c/o tachycardia. He was seen in the ED yesterday for cough, edema and COPD exacerbation. He is currently feeling shaky, hot, SOB and lightheaded/"cloudy". He was given an extra 40 mg of Lasix , started on Prednisone and Amoxicillin. He report current HR 106-110 and O2 is 96%. He did call the ER and was advised to come back but he did not want to go back to the ED. Please advise.

## 2023-08-11 NOTE — Progress Notes (Deleted)
   Cardiology Office Note    Date:  08/11/2023  ID:  Ray Griffin, DOB 01-16-1966, MRN [id number] PCP:  Ray Skeans, MD  Cardiologist:  Maisie Fus, MD  Electrophysiologist:  None   Chief Complaint: ***  History of Present Illness: Ray Griffin Kitchen    Ray Griffin is a 57 y.o. male with visit-pertinent history of ***  Labwork independently reviewed:   ROS: .    Please see the history of present illness. Otherwise, review of systems is positive for ***.  All other systems are reviewed and otherwise negative.  Studies Reviewed: Ray Griffin Kitchen    EKG:  EKG is ordered today, personally reviewed, demonstrating ***  CV Studies: Cardiac studies reviewed are outlined and summarized above. Otherwise please see EMR for full report.   Current Reported Medications:.    No outpatient medications have been marked as taking for the 08/11/23 encounter (Appointment) with Rip Harbour, NP.    Physical Exam:    VS:  There were no vitals taken for this visit.   Wt Readings from Last 3 Encounters:  08/10/23 260 lb (117.9 kg)  12/27/22 271 lb (122.9 kg)  12/19/22 260 lb (117.9 kg)    GEN: Well nourished, well developed in no acute distress NECK: No JVD; No carotid bruits CARDIAC: ***RRR, no murmurs, rubs, gallops RESPIRATORY:  Clear to auscultation without rales, wheezing or rhonchi  ABDOMEN: Soft, non-tender, non-distended EXTREMITIES:  No edema; No acute deformity   Asessement and Plan:.     ***     Disposition: F/u with ***  Signed, Rip Harbour, NP

## 2023-08-22 ENCOUNTER — Ambulatory Visit: Payer: Self-pay | Attending: Internal Medicine | Admitting: Internal Medicine

## 2023-10-03 ENCOUNTER — Telehealth: Payer: Self-pay | Admitting: Internal Medicine

## 2023-10-03 ENCOUNTER — Emergency Department (HOSPITAL_COMMUNITY): Payer: Self-pay

## 2023-10-03 ENCOUNTER — Other Ambulatory Visit: Payer: Self-pay

## 2023-10-03 ENCOUNTER — Encounter (HOSPITAL_COMMUNITY): Payer: Self-pay

## 2023-10-03 ENCOUNTER — Emergency Department (HOSPITAL_COMMUNITY)
Admission: EM | Admit: 2023-10-03 | Discharge: 2023-10-03 | Disposition: A | Discharge status: Home / Self Care | Payer: Self-pay | Attending: Emergency Medicine | Admitting: Emergency Medicine

## 2023-10-03 DIAGNOSIS — Z8709 Personal history of other diseases of the respiratory system: Secondary | ICD-10-CM

## 2023-10-03 DIAGNOSIS — I493 Ventricular premature depolarization: Secondary | ICD-10-CM | POA: Insufficient documentation

## 2023-10-03 DIAGNOSIS — R002 Palpitations: Secondary | ICD-10-CM

## 2023-10-03 DIAGNOSIS — J449 Chronic obstructive pulmonary disease, unspecified: Secondary | ICD-10-CM | POA: Insufficient documentation

## 2023-10-03 LAB — CBC WITH DIFFERENTIAL/PLATELET
Abs Immature Granulocytes: 0.04 10*3/uL (ref 0.00–0.07)
Basophils Absolute: 0.1 10*3/uL (ref 0.0–0.1)
Basophils Relative: 1 %
Eosinophils Absolute: 0.2 10*3/uL (ref 0.0–0.5)
Eosinophils Relative: 2 %
HCT: 50.2 % (ref 39.0–52.0)
Hemoglobin: 16.8 g/dL (ref 13.0–17.0)
Immature Granulocytes: 0 %
Lymphocytes Relative: 21 %
Lymphs Abs: 2.2 10*3/uL (ref 0.7–4.0)
MCH: 28.9 pg (ref 26.0–34.0)
MCHC: 33.5 g/dL (ref 30.0–36.0)
MCV: 86.3 fL (ref 80.0–100.0)
Monocytes Absolute: 0.9 10*3/uL (ref 0.1–1.0)
Monocytes Relative: 9 %
Neutro Abs: 7.3 10*3/uL (ref 1.7–7.7)
Neutrophils Relative %: 67 %
Platelets: 295 10*3/uL (ref 150–400)
RBC: 5.82 MIL/uL — ABNORMAL HIGH (ref 4.22–5.81)
RDW: 13.1 % (ref 11.5–15.5)
WBC: 10.8 10*3/uL — ABNORMAL HIGH (ref 4.0–10.5)
nRBC: 0 % (ref 0.0–0.2)

## 2023-10-03 LAB — BRAIN NATRIURETIC PEPTIDE: B Natriuretic Peptide: 145 pg/mL — ABNORMAL HIGH (ref 0.0–100.0)

## 2023-10-03 LAB — COMPREHENSIVE METABOLIC PANEL
ALT: 32 U/L (ref 0–44)
AST: 24 U/L (ref 15–41)
Albumin: 3.8 g/dL (ref 3.5–5.0)
Alkaline Phosphatase: 96 U/L (ref 38–126)
Anion gap: 8 (ref 5–15)
BUN: 19 mg/dL (ref 6–20)
CO2: 23 mmol/L (ref 22–32)
Calcium: 8.8 mg/dL — ABNORMAL LOW (ref 8.9–10.3)
Chloride: 106 mmol/L (ref 98–111)
Creatinine, Ser: 0.76 mg/dL (ref 0.61–1.24)
GFR, Estimated: >60 mL/min (ref >60)
Glucose, Bld: 103 mg/dL — ABNORMAL HIGH (ref 70–99)
Potassium: 4 mmol/L (ref 3.5–5.1)
Sodium: 137 mmol/L (ref 135–145)
Total Bilirubin: 0.8 mg/dL (ref 0.0–1.2)
Total Protein: 7.1 g/dL (ref 6.5–8.1)

## 2023-10-03 LAB — TROPONIN I (HIGH SENSITIVITY): Troponin I (High Sensitivity): 6 ng/L (ref <18)

## 2023-10-03 NOTE — ED Triage Notes (Signed)
Pt reports with shob and palpitations for a week. Pt has a cardiac hx.

## 2023-10-03 NOTE — Telephone Encounter (Signed)
 Spoke to pt (who's phone call was transferred into Triage room);  is c/o:  -For more than one week has had heavy heart palpitations and been short of breath  -The last two days this has worsened, feels like I'm running a marathon and I'm just sitting here.  -Felt like he had walking pneumonia about 2 weeks ago but did not seek medical attention for it.  -When he Lays down, his symptoms improve  -is Dizzy/Lightheaded and two-three times has had balance issues  VS while speaking to patient:  HR=80, BP=80/65, SpO2=96% (Patient states his BP is usually not this low)  After speaking to another triage nurse, patient was advised to go to the ED and he should have someone drive him there.  Pt states, I have my buddy here who can take me.  He verbalized understanding and is in agreement.

## 2023-10-03 NOTE — ED Provider Notes (Signed)
 [place] EMERGENCY DEPARTMENT AT [facility] Provider Note   CSN: [id number] Arrival date & time: 10/03/23  1537     History  Chief Complaint  Patient presents with   Palpitations    Ray Griffin is a 57 y.o. male.  Pt c/o palpitations in past few days. Notes long hx same, was told due to pvcs in past. Feels as if feels a skip or thump - pasts a second or two per each episode. No persistent fast heart beating. No fainting. No chest pain or discomfort. No sob. No abd pain or nvd. No fever or chills. No recent change in meds.   The history is provided by the patient and medical records.  Palpitations Associated symptoms: no back pain, no chest pain, no cough, no shortness of breath and no vomiting        Home Medications Prior to Admission medications   Medication Sig Start Date End Date Taking? Authorizing Provider  albuterol  (VENTOLIN  HFA) 108 (90 Base) MCG/ACT inhaler Inhale 1-2 puffs into the lungs every 6 (six) hours as needed for wheezing or shortness of breath (or cough). 12/31/22   Mesner, Selinda, MD  amoxicillin -clavulanate (AUGMENTIN ) 875-125 MG tablet Take 1 tablet by mouth every 12 (twelve) hours. 08/10/23   Hildegard, Amjad, PA-C  azithromycin  (ZITHROMAX ) 250 MG tablet Take 1 tablet (250 mg total) by mouth daily. Take first 2 tablets together, then 1 every day until finished. 01/01/23   Mesner, Selinda, MD  chlorthalidone  (HYGROTON ) 25 MG tablet Take 1 tablet (25 mg total) by mouth daily. 08/08/22   Alvan Ronal BRAVO, MD  furosemide  (LASIX ) 20 MG tablet TAKE 1 TABLET BY MOUTH EVERY DAY 06/19/23   Alvan Ronal BRAVO, MD  losartan  (COZAAR ) 50 MG tablet Take 1 tablet (50 mg total) by mouth daily. 08/08/22   Alvan Ronal BRAVO, MD  metoprolol  succinate (TOPROL -XL) 25 MG 24 hr tablet Take 1 tablet (25 mg total) by mouth daily. Take with or immediately following a meal. 12/27/22   Alvan Ronal BRAVO, MD      Allergies    Patient has no known allergies.    Review of Systems   Review  of Systems  Constitutional:  Negative for chills and fever.  HENT:  Negative for sore throat.   Respiratory:  Negative for cough and shortness of breath.   Cardiovascular:  Positive for palpitations. Negative for chest pain.  Gastrointestinal:  Negative for abdominal pain, diarrhea and vomiting.  Genitourinary:  Negative for dysuria and flank pain.  Musculoskeletal:  Negative for back pain and neck pain.  Neurological:  Negative for syncope and headaches.    Physical Exam Updated Vital Signs BP 128/82 (BP Location: Left Arm)   Pulse (!) 59   Temp 98.1 F (36.7 C) (Oral)   Resp 18   Ht 1.854 m (6' 1)   Wt 117.9 kg   SpO2 95%   BMI 34.29 kg/m  Physical Exam Vitals and nursing note reviewed.  Constitutional:      Appearance: Normal appearance. He is well-developed.  HENT:     Head: Atraumatic.     Nose: Nose normal.     Mouth/Throat:     Mouth: Mucous membranes are moist.  Eyes:     General: No scleral icterus.    Conjunctiva/sclera: Conjunctivae normal.  Neck:     Trachea: No tracheal deviation.     Comments: Trachea midline, thyroid not grossly enlarged or tender.  Cardiovascular:     Rate and Rhythm:  Normal rate and regular rhythm.     Pulses: Normal pulses.     Heart sounds: Normal heart sounds. No murmur heard.    No friction rub. No gallop.  Pulmonary:     Effort: Pulmonary effort is normal. No accessory muscle usage or respiratory distress.     Breath sounds: Normal breath sounds.  Abdominal:     General: There is no distension.     Palpations: Abdomen is soft.     Tenderness: There is no abdominal tenderness.  Genitourinary:    Comments: No cva tenderness. Musculoskeletal:     Cervical back: Normal range of motion and neck supple. No rigidity.     Comments: Mild symmetric bilateral ankle/lower leg edema.   Skin:    General: Skin is warm and dry.     Findings: No rash.  Neurological:     Mental Status: He is alert.     Comments: Alert, speech clear.  Motor/sens grossly intact bil. Steady gait.   Psychiatric:        Mood and Affect: Mood normal.     ED Results / Procedures / Treatments   Labs (all labs ordered are listed, but only abnormal results are displayed) Results for orders placed or performed during the hospital encounter of 10/03/23  CBC with Differential   Collection Time: 10/03/23  4:21 PM  Result Value Ref Range   WBC 10.8 (H) 4.0 - 10.5 K/uL   RBC 5.82 (H) 4.22 - 5.81 MIL/uL   Hemoglobin 16.8 13.0 - 17.0 g/dL   HCT 49.7 60.9 - 47.9 %   MCV 86.3 80.0 - 100.0 fL   MCH 28.9 26.0 - 34.0 pg   MCHC 33.5 30.0 - 36.0 g/dL   RDW 86.8 88.4 - 84.4 %   Platelets 295 150 - 400 K/uL   nRBC 0.0 0.0 - 0.2 %   Neutrophils Relative % 67 %   Neutro Abs 7.3 1.7 - 7.7 K/uL   Lymphocytes Relative 21 %   Lymphs Abs 2.2 0.7 - 4.0 K/uL   Monocytes Relative 9 %   Monocytes Absolute 0.9 0.1 - 1.0 K/uL   Eosinophils Relative 2 %   Eosinophils Absolute 0.2 0.0 - 0.5 K/uL   Basophils Relative 1 %   Basophils Absolute 0.1 0.0 - 0.1 K/uL   Immature Granulocytes 0 %   Abs Immature Granulocytes 0.04 0.00 - 0.07 K/uL  Comprehensive metabolic panel   Collection Time: 10/03/23  4:21 PM  Result Value Ref Range   Sodium 137 135 - 145 mmol/L   Potassium 4.0 3.5 - 5.1 mmol/L   Chloride 106 98 - 111 mmol/L   CO2 23 22 - 32 mmol/L   Glucose, Bld 103 (H) 70 - 99 mg/dL   BUN 19 6 - 20 mg/dL   Creatinine, Ser 9.23 0.61 - 1.24 mg/dL   Calcium 8.8 (L) 8.9 - 10.3 mg/dL   Total Protein 7.1 6.5 - 8.1 g/dL   Albumin 3.8 3.5 - 5.0 g/dL   AST 24 15 - 41 U/L   ALT 32 0 - 44 U/L   Alkaline Phosphatase 96 38 - 126 U/L   Total Bilirubin 0.8 0.0 - 1.2 mg/dL   GFR, Estimated >39 >39 mL/min   Anion gap 8 5 - 15  Brain natriuretic peptide   Collection Time: 10/03/23  4:21 PM  Result Value Ref Range   B Natriuretic Peptide 145.0 (H) 0.0 - 100.0 pg/mL  Troponin I (High Sensitivity)   Collection Time: 10/03/23  4:21 PM  Result Value Ref Range   Troponin  I (High Sensitivity) 6 <18 ng/L     EKG EKG Interpretation Date/Time:  Tuesday October 03 2023 16:00:34 EST Ventricular Rate:  83 PR Interval:  140 QRS Duration:  116 QT Interval:  379 QTC Calculation: 446 R Axis:   116  Text Interpretation: Sinus rhythm Premature ventricular complexes Nonspecific intraventricular conduction delay Confirmed by Bernard Drivers ([id number]) on 10/03/2023 6:40:00 PM  Radiology DG Chest 2 View Result Date: 10/03/2023 CLINICAL DATA:  Shortness of breath and palpitations x1 week. EXAM: CHEST - 2 VIEW COMPARISON:  August 10, 2023 FINDINGS: The cardiac silhouette is mildly enlarged and unchanged in size. The lungs are hyperinflated with evidence of chronic emphysematous lung disease. Stable mild to moderate severity areas of scarring, atelectasis and/or infiltrate are seen within the bilateral lung bases and mid left lung. Small bilateral pleural effusions are noted. No pneumothorax is identified. Multilevel degenerative changes seen throughout the thoracic spine. IMPRESSION: 1. Chronic emphysematous lung disease with stable mild to moderate severity areas of scarring, atelectasis and/or infiltrate within the bilateral lung bases and mid left lung. 2. Small bilateral pleural effusions. Electronically Signed   By: Suzen Dials M.D.   On: 10/03/2023 18:55    Procedures Procedures    Medications Ordered in ED Medications - No data to display  ED Course/ Medical Decision Making/ A&P                                 Medical Decision Making Problems Addressed: History of COPD: chronic illness or injury with exacerbation, progression, or side effects of treatment that poses a threat to life or bodily functions Palpitations: acute illness or injury with systemic symptoms that poses a threat to life or bodily functions PVC (premature ventricular contraction): acute illness or injury with systemic symptoms  Amount and/or Complexity of Data Reviewed External Data  Reviewed: notes. Labs: ordered. Decision-making details documented in ED Course. Radiology: ordered and independent interpretation performed. Decision-making details documented in ED Course. ECG/medicine tests: ordered and independent interpretation performed. Decision-making details documented in ED Course.  Risk Decision regarding hospitalization.   Iv ns. Continuous pulse ox and cardiac monitoring. Labs ordered/sent. Imaging ordered.   Differential diagnosis includes pvc, other dysrhythmia, etc. Dispo decision including potential need for admission considered - will get labs and imaging and reassess.   Reviewed nursing notes and prior charts for additional history. External reports reviewed.   Cardiac monitor: sinus rhythm, rate 66.  Labs reviewed/interpreted by me - wbc d10, hct 50. Chem normal. Trop normal.   Xrays reviewed/interpreted by me - no pna. Atelectasis/chronic changes.   Occasional pvcs. No other dysrhythmia noted. Pt notes hx same. Rec outpatient pcp f/u.  Pt also requests referral to pulmonary (prior smoker, indicates recently quit).   Pt currently appear stable for d/c.   Rec close pcp/cardiology  f/u.  Return precautions provided.          Final Clinical Impression(s) / ED Diagnoses Final diagnoses:  None    Rx / DC Orders ED Discharge Orders     None         Bernard Drivers, MD 10/03/23 1925

## 2023-10-03 NOTE — ED Provider Triage Note (Signed)
 Emergency Medicine Provider Triage Evaluation Note  Ray Griffin , a 57 y.o. male  was evaluated in triage.  Pt complains of palpitations and shortness of breath for the past week.  He feels that it has worsened over the last 2 days.  He checks his O2 and pulse at home, which has been normal.  Patient feels better when he lies down.  Hx of COPD, hypertension, CHF.   Review of Systems  Positive: As above Negative: As above  Physical Exam  There were no vitals taken for this visit. Gen:   Awake, no distress   Resp:  Normal effort  MSK:   Moves extremities without difficulty  Other:    Medical Decision Making  Medically screening exam initiated at 3:59 PM.  Appropriate orders placed.  Ray Griffin was informed that the remainder of the evaluation will be completed by another provider, this initial triage assessment does not replace that evaluation, and the importance of remaining in the ED until their evaluation is complete.     Gretta Sayres R, PA-C 10/03/23 1600

## 2023-10-03 NOTE — Discharge Instructions (Signed)
 It was our pleasure to provide your ER care today - we hope that you feel better.  Follow heart health eating plan. Avoid any smoking. Minimize caffeine use.   Follow up closely with cardiology in the next 1-2 weeks.   A referral to pulmonary follow up was also made per your request.   Return to ER if worse, new symptoms, fevers, chest pain, increased trouble breathing, persistent fast heart beating, fainting, or other concern.

## 2023-10-03 NOTE — Telephone Encounter (Signed)
 Patient c/o Palpitations:  STAT if patient reporting lightheadedness, shortness of breath, or chest pain  How long have you had palpitations/irregular HR/ Afib? Are you having the symptoms now? Palpitations for about a week   Are you currently experiencing lightheadedness, SOB or CP? SOB    Do you have a history of afib (atrial fibrillation) or irregular heart rhythm? No   Have you checked your BP or HR? (document readings if available): hr has been between 40's-90's (up and down)   Are you experiencing any other symptoms? Fatigue and SOB feels like he is running a marathon

## 2023-10-05 ENCOUNTER — Telehealth: Payer: Self-pay | Admitting: Internal Medicine

## 2023-10-05 NOTE — Telephone Encounter (Signed)
 Called patient with no answer. Left vm to call back.

## 2023-10-05 NOTE — Telephone Encounter (Signed)
 Pt c/o BP issue: STAT if pt c/o blurred vision, one-sided weakness or slurred speech  1. What are your last 5 BP readings? 174/111; 74  2. Are you having any other symptoms (ex. Dizziness, headache, blurred vision, passed out)? Headache, PVC's  3. What is your BP issue? Elevated BP with PVC's

## 2023-10-06 NOTE — Telephone Encounter (Signed)
 2nd attempt to call patient, no answer left message requesting a call back.

## 2023-10-09 NOTE — Telephone Encounter (Signed)
 3rd attempt to call patient, no answer, left message requesting a call back Nursing will await for patient to return call

## 2023-10-13 NOTE — Telephone Encounter (Signed)
 Patient identification verified by 2 forms. Bertina Cooks, RN    Received call from patient Patient states:   -has not checked BP in a few days   -heart rate is good  -HR at time if call is 58   -has congestion   -a cough for 2 weeks, cough causes chest pain   -O2 level is 93% on room air   -has not contacted PCP office regarding flu like/URI symptoms  Patient denies:  -chest pain   -SOB/difficulty breathing   -headaches   -fever/chills  Advised patient to contact PCP office regarding flu like/URI symptoms  Reviewed ED warning signs/precautions  Patient verbalized understanding, no questions at this time

## 2023-10-15 ENCOUNTER — Encounter (HOSPITAL_COMMUNITY): Payer: Self-pay

## 2023-10-15 ENCOUNTER — Other Ambulatory Visit: Payer: Self-pay

## 2023-10-15 ENCOUNTER — Emergency Department (HOSPITAL_COMMUNITY): Payer: Self-pay

## 2023-10-15 ENCOUNTER — Emergency Department (HOSPITAL_COMMUNITY)
Admission: EM | Admit: 2023-10-15 | Discharge: 2023-10-15 | Disposition: A | Discharge status: Home / Self Care | Payer: Self-pay | Attending: Emergency Medicine | Admitting: Emergency Medicine

## 2023-10-15 DIAGNOSIS — J189 Pneumonia, unspecified organism: Secondary | ICD-10-CM | POA: Insufficient documentation

## 2023-10-15 DIAGNOSIS — Z79899 Other long term (current) drug therapy: Secondary | ICD-10-CM | POA: Insufficient documentation

## 2023-10-15 DIAGNOSIS — J441 Chronic obstructive pulmonary disease with (acute) exacerbation: Secondary | ICD-10-CM | POA: Insufficient documentation

## 2023-10-15 DIAGNOSIS — Z20822 Contact with and (suspected) exposure to covid-19: Secondary | ICD-10-CM | POA: Insufficient documentation

## 2023-10-15 DIAGNOSIS — I10 Essential (primary) hypertension: Secondary | ICD-10-CM | POA: Insufficient documentation

## 2023-10-15 LAB — CBC WITH DIFFERENTIAL/PLATELET
Abs Immature Granulocytes: 0.03 K/uL (ref 0.00–0.07)
Basophils Absolute: 0.1 K/uL (ref 0.0–0.1)
Basophils Relative: 1 %
Eosinophils Absolute: 0.2 K/uL (ref 0.0–0.5)
Eosinophils Relative: 2 %
HCT: 50.1 % (ref 39.0–52.0)
Hemoglobin: 16.1 g/dL (ref 13.0–17.0)
Immature Granulocytes: 0 %
Lymphocytes Relative: 18 %
Lymphs Abs: 2 K/uL (ref 0.7–4.0)
MCH: 28 pg (ref 26.0–34.0)
MCHC: 32.1 g/dL (ref 30.0–36.0)
MCV: 87 fL (ref 80.0–100.0)
Monocytes Absolute: 0.9 K/uL (ref 0.1–1.0)
Monocytes Relative: 8 %
Neutro Abs: 8 K/uL — ABNORMAL HIGH (ref 1.7–7.7)
Neutrophils Relative %: 71 %
Platelets: 287 K/uL (ref 150–400)
RBC: 5.76 MIL/uL (ref 4.22–5.81)
RDW: 13 % (ref 11.5–15.5)
WBC: 11.2 K/uL — ABNORMAL HIGH (ref 4.0–10.5)
nRBC: 0 % (ref 0.0–0.2)

## 2023-10-15 LAB — BRAIN NATRIURETIC PEPTIDE: B Natriuretic Peptide: 153.5 pg/mL — ABNORMAL HIGH (ref 0.0–100.0)

## 2023-10-15 LAB — BASIC METABOLIC PANEL
Anion gap: 7 (ref 5–15)
BUN: 11 mg/dL (ref 6–20)
CO2: 25 mmol/L (ref 22–32)
Calcium: 9 mg/dL (ref 8.9–10.3)
Chloride: 103 mmol/L (ref 98–111)
Creatinine, Ser: 0.73 mg/dL (ref 0.61–1.24)
GFR, Estimated: >60 mL/min (ref >60)
Glucose, Bld: 96 mg/dL (ref 70–99)
Potassium: 3.9 mmol/L (ref 3.5–5.1)
Sodium: 135 mmol/L (ref 135–145)

## 2023-10-15 LAB — RESP PANEL BY RT-PCR (RSV, FLU A&B, COVID)  RVPGX2
Influenza A by PCR: NEGATIVE
Influenza B by PCR: NEGATIVE
Resp Syncytial Virus by PCR: NEGATIVE
SARS Coronavirus 2 by RT PCR: NEGATIVE

## 2023-10-15 MED ORDER — PREDNISONE 10 MG PO TABS: 50.0000 mg | ORAL_TABLET | Freq: Every day | ORAL | 0 refills | Status: DC

## 2023-10-15 MED ORDER — ALBUTEROL SULFATE HFA 108 (90 BASE) MCG/ACT IN AERS
2.0000 | INHALATION_SPRAY | RESPIRATORY_TRACT | Status: DC | PRN
  Administered 2023-10-15: 2 via RESPIRATORY_TRACT
  Filled 2023-10-15: qty 6.7

## 2023-10-15 MED ORDER — METHYLPREDNISOLONE SODIUM SUCC 125 MG IJ SOLR
125.0000 mg | Freq: Once | INTRAMUSCULAR | Status: AC
  Administered 2023-10-15: 125 mg via INTRAVENOUS
  Filled 2023-10-15: qty 2

## 2023-10-15 MED ORDER — AMOXICILLIN-POT CLAVULANATE 875-125 MG PO TABS: 1.0000 | ORAL_TABLET | Freq: Two times a day (BID) | ORAL | 0 refills | Status: AC

## 2023-10-15 MED ORDER — AMOXICILLIN-POT CLAVULANATE 875-125 MG PO TABS
1.0000 | ORAL_TABLET | Freq: Once | ORAL | Status: AC
  Administered 2023-10-15: 1 via ORAL
  Filled 2023-10-15: qty 1

## 2023-10-15 MED ORDER — IPRATROPIUM BROMIDE 0.02 % IN SOLN
0.5000 mg | Freq: Once | RESPIRATORY_TRACT | Status: AC
  Administered 2023-10-15: 0.5 mg via RESPIRATORY_TRACT
  Filled 2023-10-15: qty 2.5

## 2023-10-15 MED ORDER — ALBUTEROL SULFATE (2.5 MG/3ML) 0.083% IN NEBU
10.0000 mg/h | INHALATION_SOLUTION | Freq: Once | RESPIRATORY_TRACT | Status: AC
  Administered 2023-10-15: 10 mg/h via RESPIRATORY_TRACT
  Filled 2023-10-15: qty 12

## 2023-10-15 NOTE — ED Provider Notes (Signed)
 [place] EMERGENCY DEPARTMENT AT [facility] Provider Note   CSN: [id number] Arrival date & time: 10/15/23  9085     History  Chief Complaint  Patient presents with   Shortness of Breath   Cough    Ray Griffin is a 58 y.o. male.  HPI    58 year old male comes in with chief complaint of cough, shortness of breath. Patient has history of COPD, hypertension and he thinks CHF, however, review of his chart including cardiology note from 12-2022 and echocardiogram reveals preserved EF and no valvular disorder.  Patient states that he has been having cough, congestion, shortness of breath for a month now.  However over the last 3 days he has had increasing cough and worsening shortness of breath.  Shortness of breath worse when he is laying flat.  He is still producing clear, thick phlegm.  Review of system is negative for fevers.  He has been using inhalers without significant relief.  Home Medications Prior to Admission medications   Medication Sig Start Date End Date Taking? Authorizing Provider  albuterol  (PROVENTIL ) (2.5 MG/3ML) 0.083% nebulizer solution Take 2.5 mg by nebulization every 6 (six) hours as needed for wheezing or shortness of breath.   Yes [provider]  furosemide  (LASIX ) 20 MG tablet TAKE 1 TABLET BY MOUTH EVERY DAY 06/19/23  Yes Branch, Ronal BRAVO, MD  losartan  (COZAAR ) 50 MG tablet Take 1 tablet (50 mg total) by mouth daily. 08/08/22  Yes Alvan Ronal BRAVO, MD  metoprolol  succinate (TOPROL -XL) 25 MG 24 hr tablet Take 1 tablet (25 mg total) by mouth daily. Take with or immediately following a meal. 12/27/22  Yes Branch, Ronal BRAVO, MD  Pseudoephedrine-APAP-DM (TYLENOL  COLD/FLU DAY) 30-500-15 MG TABS Take 1 tablet by mouth every 4 (four) hours as needed (cold symptoms).   Yes [provider]  albuterol  (VENTOLIN  HFA) 108 (90 Base) MCG/ACT inhaler Inhale 1-2 puffs into the lungs every 6 (six) hours as needed for wheezing or shortness of breath (or  cough). Patient not taking: Reported on 10/15/2023 12/31/22   Mesner, Selinda, MD      Allergies    Patient has no known allergies.    Review of Systems   Review of Systems  All other systems reviewed and are negative.   Physical Exam Updated Vital Signs BP 130/69   Pulse 67   Temp 98.3 F (36.8 C) (Oral)   Resp 16   Ht 6' 1 (1.854 m)   Wt 117.9 kg   SpO2 95%   BMI 34.29 kg/m  Physical Exam Vitals and nursing note reviewed.  Constitutional:      Appearance: He is well-developed.  HENT:     Head: Atraumatic.  Cardiovascular:     Rate and Rhythm: Normal rate.  Pulmonary:     Effort: Pulmonary effort is normal.     Breath sounds: Wheezing present. No decreased breath sounds.     Comments: Inspiratory and expiratory wheezing noted Musculoskeletal:     Cervical back: Neck supple.  Skin:    General: Skin is warm.  Neurological:     Mental Status: He is alert and oriented to person, place, and time.     ED Results / Procedures / Treatments   Labs (all labs ordered are listed, but only abnormal results are displayed) Labs Reviewed  CBC WITH DIFFERENTIAL/PLATELET - Abnormal; Notable for the following components:      Result Value   WBC 11.2 (*)    Neutro Abs 8.0 (*)  All other components within normal limits  BRAIN NATRIURETIC PEPTIDE - Abnormal; Notable for the following components:   B Natriuretic Peptide 153.5 (*)    All other components within normal limits  RESP PANEL BY RT-PCR (RSV, FLU A&B, COVID)  RVPGX2  BASIC METABOLIC PANEL    EKG EKG Interpretation Date/Time:  Sunday October 15 2023 09:21:48 EST Ventricular Rate:  85 PR Interval:  150 QRS Duration:  97 QT Interval:  355 QTC Calculation: 423 R Axis:   93  Text Interpretation: Sinus rhythm Multiple premature complexes, vent & supraven Borderline right axis deviation No acute changes No significant change since last tracing Confirmed by Charlyn Sora [phone]) on 10/15/2023 11:40:08  AM  Radiology DG Chest 2 View Result Date: 10/15/2023 CLINICAL DATA:  Cough, shortness of breath EXAM: CHEST - 2 VIEW COMPARISON:  10/03/2023, 08/10/2023 FINDINGS: Stable heart size. Persistent bibasilar opacities and scarring, similar in appearance to the prior study. Small bilateral pleural effusions. Emphysema. No pneumothorax. IMPRESSION: 1. Persistent bibasilar opacities and scarring, similar in appearance to the prior study. A superimposed pneumonia would be difficult to exclude. 2. Small bilateral pleural effusions. Electronically Signed   By: Mabel Converse D.O.   On: 10/15/2023 09:54    Procedures Procedures    Medications Ordered in ED Medications  albuterol  (VENTOLIN  HFA) 108 (90 Base) MCG/ACT inhaler 2 puff (2 puffs Inhalation Given 10/15/23 1004)  amoxicillin -clavulanate (AUGMENTIN ) 875-125 MG per tablet 1 tablet (has no administration in time range)  methylPREDNISolone  sodium succinate (SOLU-MEDROL ) 125 mg/2 mL injection 125 mg (125 mg Intravenous Given 10/15/23 1127)  albuterol  (PROVENTIL ) (2.5 MG/3ML) 0.083% nebulizer solution (10 mg/hr Nebulization Given 10/15/23 1127)  ipratropium (ATROVENT ) nebulizer solution 0.5 mg (0.5 mg Nebulization Given 10/15/23 1127)    ED Course/ Medical Decision Making/ A&P                                 Medical Decision Making Amount and/or Complexity of Data Reviewed Labs: ordered. Radiology: ordered.  Risk Prescription drug management.  This patient presents to the ED with chief complaint(s) of shortness of breath with pertinent past medical history of COPD, PVCs.The complaint involves an extensive differential diagnosis and also carries with it a high risk of complications and morbidity.    The differential diagnosis includes : COPD exacerbation, acute pulmonary edema, CHF, viral illness, superimposed bacterial pneumonia, severe anemia, pleural effusion  The initial plan is to get basic labs, put patient on nebulizer treatment and  then reassess him.  We will also get x-ray of the chest  Additional history obtained: Records reviewed  previous cardiology note, echocardiogram.  Patient has PVCs, preserved EF on echocardiogram with no valve disease from 2024.  Also has had palpitations related ER visit.  Independent labs interpretation:  The following labs were independently interpreted: CBC shows no profound anemia.  Metabolic profile is normal.  White count is 11.2.  Independent visualization and interpretation of imaging: - I independently visualized the following imaging with scope of interpretation limited to determining acute life threatening conditions related to emergency care: X-ray of the chest, which revealed bilateral pleural effusion, right-sided consolidation which appears mostly unchanged.  X-ray compared to x-ray from 12 days ago and mostly unchanged.  Treatment and Reassessment: Patient feels better after breathing treatments. Feels that he continues to have the cough. Plan is to put him on Augmentin  for suspected walking pneumonia and asthma exacerbation in the setting of worsening  cough, wheezing.   Final Clinical Impression(s) / ED Diagnoses Final diagnoses:  COPD exacerbation (HCC)  Community acquired pneumonia, unspecified laterality    Rx / DC Orders ED Discharge Orders     None         Charlyn Sora, MD 10/15/23 1451

## 2023-10-15 NOTE — Discharge Instructions (Addendum)
 You were seen in the ER for shortness of breath, cough.  X-rays are overall reassuring.  We do suspect that possibly her having a walking pneumonia with COPD exacerbation in the setting of negative COVID, flu and RSV test.  Please start taking the antibiotics that are prescribed along with prednisone . Make sure you are using your inhaler appropriately.  Return to the ER if your symptoms get worse.

## 2023-10-15 NOTE — ED Triage Notes (Signed)
 Pt reports with shob and cough since Thursday. Pt has been taking his nebulizer txs with no relief. Pt reports checking his o2 sats at home and the lowest being 85% at times.

## 2023-11-22 ENCOUNTER — Ambulatory Visit: Payer: Self-pay | Admitting: Internal Medicine

## 2023-12-06 ENCOUNTER — Telehealth: Payer: Self-pay | Admitting: Internal Medicine

## 2023-12-06 MED ORDER — METOPROLOL SUCCINATE ER 25 MG PO TB24: 25.0000 mg | ORAL_TABLET | Freq: Every day | ORAL | 0 refills | Status: DC

## 2023-12-06 MED ORDER — FUROSEMIDE 20 MG PO TABS: 20.0000 mg | ORAL_TABLET | Freq: Every day | ORAL | 0 refills | Status: DC

## 2023-12-06 NOTE — Telephone Encounter (Signed)
*  STAT* If patient is at the pharmacy, call can be transferred to refill team.   1. Which medications need to be refilled? (please list name of each medication and dose if known)   furosemide (LASIX) 20 MG tablet  metoprolol succinate (TOPROL-XL) 25 MG 24 hr tablet   2. Would you like to learn more about the convenience, safety, & potential cost savings by using the [facility]?   3. Are you open to using the [facility] (Type [facility]. ).  4. Which pharmacy/location (including street and city if local pharmacy) is medication to be sent to?  [id number] - [place], [place] - [address]   5. Do they need a 30 day or 90 day supply?   90 day  Patient still has some medication.

## 2023-12-06 NOTE — Telephone Encounter (Signed)
 Pt's medication was sent to pt's pharmacy as requested. Confirmation received.

## 2023-12-07 ENCOUNTER — Telehealth: Payer: Self-pay | Admitting: Licensed Clinical Social Worker

## 2023-12-07 ENCOUNTER — Ambulatory Visit: Payer: Self-pay | Admitting: Internal Medicine

## 2023-12-07 NOTE — Telephone Encounter (Signed)
 H&V Care Navigation CSW Progress Note  Clinical Social Worker contacted patient by phone to f/u on rescheduled appt today. Pt was on my self pay pt list and noted when cancelling that he had financial issues. LCSW had attempted previously to refer pt for [facility] screening/[facility] Financial Assistance. No answer today when I called him at [phone]. I have sent him a copy of Medicaid flyer and [employer] satellite office for [employer] DSS caseworkers, [employer] and information about SNAP benefits and [employer] to assist with costs of care. Remain available should pt return call. Note pt rescheduled to April.   Patient is participating in a Managed Medicaid Plan:  No, self pay only  SDOH Screenings   Food Insecurity: No Food Insecurity (08/08/2022)  Housing: Low Risk  (08/08/2022)  Transportation Needs: No Transportation Needs (08/08/2022)  Utilities: Not At Risk (08/08/2022)  Alcohol Screen: Low Risk  (08/08/2022)  Depression (PHQ2-9): High Risk (06/22/2021)  Financial Resource Strain: Medium Risk (08/08/2022)  Tobacco Use: High Risk (10/15/2023)    Octavio Graves, MSW, LCSW Clinical Social Worker II [facility] Heart/Vascular Care Navigation  [phone]- work cell phone (preferred) [phone]- desk phone

## 2024-01-02 ENCOUNTER — Telehealth: Payer: Self-pay | Admitting: Internal Medicine

## 2024-01-02 MED ORDER — LOSARTAN POTASSIUM 50 MG PO TABS
50.0000 mg | ORAL_TABLET | Freq: Every day | ORAL | 0 refills | Status: DC
Start: 2024-01-02 — End: 2024-04-01

## 2024-01-02 NOTE — Telephone Encounter (Signed)
 Pt's medication was sent to pt's pharmacy as requested. Confirmation received.

## 2024-01-02 NOTE — Telephone Encounter (Signed)
*  STAT* If patient is at the pharmacy, call can be transferred to refill team.   1. Which medications need to be refilled? (please list name of each medication and dose if known)   2. Which pharmacy/location (including street and city if local pharmacy) is medication to be sent to?  [id number] - [place], [place] - [address]    3. Do they need a 30 day or 90 day supply? 90

## 2024-01-09 ENCOUNTER — Ambulatory Visit: Payer: Self-pay | Admitting: Internal Medicine

## 2024-02-16 NOTE — Progress Notes (Deleted)
 Cardiology Clinic Note   Patient Name: DETRIC SCALISI Date of Encounter: 02/16/2024  Primary Care Provider:  Abraham Abo, MD Primary Cardiologist:  Bridgette Campus, MD  Patient Profile    YOGESH COMINSKY 58 year old male presents to the clinic today for follow-up evaluation of his palpitations, and essential hypertension.  Past Medical History    Past Medical History:  Diagnosis Date   Anxiety    COPD (chronic obstructive pulmonary disease) (HCC)    Emphysema    Hypertension    Tachycardia    Past Surgical History:  Procedure Laterality Date   CHOLECYSTECTOMY N/A 12/04/2014   Procedure: LAPAROSCOPIC CHOLECYSTECTOMY WITH POSSIBLE INTRAOPERATIVE CHOLANGIOGRAM;  Surgeon: Fran Imus, MD;  Location: [facility];  Service: General;  Laterality: N/A;   FACIAL COSMETIC SURGERY     THORACOTOMY      Allergies  No Known Allergies  History of Present Illness    JADIEN LEHIGH has a PMH of PVCs, essential hypertension, COPD, gallstones, acute cholecystitis depression, and acute panic attack.  He was seen in follow-up by Dr. Amanda Jungling on 12/27/2022.  During that time his EKG showed sinus rhythm with PVCs.  Chronic shortness of breath and history of COPD.  He denied chest pressure or squeezing.  He continued to smoke but had been refraining from smoking for the past few weeks.  He noted some lightheadedness.  He presented to the emergency department and was diagnosed with presyncope.  He was drinking 2 cups of coffee per day.  He denied syncopal episode.  He was not checking his blood pressure at home.  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  In 2012 he had stress echo which showed 9 METS of physical activity.  He was noted to have hyper tensive response.  His EF was normal.  He was noted to have mild left VH.  He underwent cardiac catheterization 06/09/2011 and was noted to have normal coronaries.  He wore a cardiac event monitor 08/22/2022 which showed PVC burden of 4.9%.  Coronary CTA showed  38 percentile for age and coronary artery calcium score of 1.  Echocardiogram 08/30/2022 showed normal LVEF RV function and no valvular abnormalities.  His mother had CABG, hypertension, father had hypertension, grandmother had CHF.  He presents to the clinic today for follow-up evaluation and states***.  *** denies chest pain, shortness of breath, lower extremity edema, fatigue, palpitations, melena, hematuria, hemoptysis, diaphoresis, weakness, presyncope, syncope, orthopnea, and PND.  PVCs, palpitations-EKG today shows***.  Reports intermittent episodes of palpitations. Avoid triggers caffeine, chocolate, EtOH, dehydration etc. Continue metoprolol   Essential hypertension-BP today*** Maintain blood pressure log Heart healthy low-sodium diet Continue metoprolol , losartan , furosemide  Ordered BMP  Tachycardia-denies recent episodes of accelerated or irregular heartbeat.  Previously wore cardiac event monitor which showed 4.9% PVC burden. Continue metoprolol  Increase physical activity as tolerated Maintain p.o. hydration  COPD-breathing at baseline. Increase physical activity as tolerated Continue albuterol  Following with PCP  Disposition: Follow-up with Dr. Alda Amas or me in 12 months.  Home Medications    Prior to Admission medications   Medication Sig Start Date End Date Taking? Authorizing Provider  albuterol  (PROVENTIL ) (2.5 MG/3ML) 0.083% nebulizer solution Take 2.5 mg by nebulization every 6 (six) hours as needed for wheezing or shortness of breath.    [provider]  albuterol  (VENTOLIN  HFA) 108 (90 Base) MCG/ACT inhaler Inhale 1-2 puffs into the lungs every 6 (six) hours as needed for wheezing or shortness of breath (or cough). Patient not taking:  Reported on 10/15/2023 12/31/22   Mesner, Reymundo Caulk, MD  amoxicillin -clavulanate (AUGMENTIN ) 875-125 MG tablet Take 1 tablet by mouth every 12 (twelve) hours. 10/15/23   Deatra Face, MD  furosemide  (LASIX ) 20 MG tablet  Take 1 tablet (20 mg total) by mouth daily. 12/06/23   Bridgette Campus, MD  losartan  (COZAAR ) 50 MG tablet Take 1 tablet (50 mg total) by mouth daily. 01/02/24   Bridgette Campus, MD  metoprolol  succinate (TOPROL -XL) 25 MG 24 hr tablet Take 1 tablet (25 mg total) by mouth daily. Take with or immediately following a meal. 12/06/23   Bridgette Campus, MD  predniSONE  (DELTASONE ) 10 MG tablet Take 5 tablets (50 mg total) by mouth daily. 10/16/23   Nanavati, Ankit, MD  Pseudoephedrine-APAP-DM (TYLENOL  COLD/FLU DAY) 30-500-15 MG TABS Take 1 tablet by mouth every 4 (four) hours as needed (cold symptoms).    [provider]    Family History    Family History  Problem Relation Age of Onset   Diabetes Mother    Heart failure Mother    Diabetes Father    Heart failure Father    Emphysema Father        smoked   Bipolar disorder Brother    Thyroid cancer Maternal Grandmother    He indicated that his mother is alive. He indicated that his father is alive. He indicated that the status of his brother is unknown. He indicated that the status of his maternal grandmother is unknown.  Social History    Social History   Socioeconomic History   Marital status: Single    Spouse name: Not on file   Number of children: Not on file   Years of education: Not on file   Highest education level: Not on file  Occupational History   Not on file  Tobacco Use   Smoking status: Every Day    Current packs/day: 1.00    Average packs/day: 1 pack/day for 30.0 years (30.0 ttl pk-yrs)    Types: Cigarettes   Smokeless tobacco: Never  Vaping Use   Vaping status: Never Used  Substance and Sexual Activity   Alcohol use: No    Comment: recently quit 3 mths ago   Drug use: No   Sexual activity: Not on file  Other Topics Concern   Not on file  Social History Narrative   Not on file   Social Drivers of Health   Financial Resource Strain: Medium Risk (08/08/2022)   Overall Financial Resource Strain (CARDIA)     Difficulty of Paying Living Expenses: Somewhat hard  Food Insecurity: No Food Insecurity (08/08/2022)   Hunger Vital Sign    Worried About Running Out of Food in the Last Year: Never true    Ran Out of Food in the Last Year: Never true  Transportation Needs: No Transportation Needs (08/08/2022)   PRAPARE - Administrator, Civil Service (Medical): No    Lack of Transportation (Non-Medical): No  Physical Activity: Not on file  Stress: Not on file  Social Connections: Not on file  Intimate Partner Violence: Not on file     Review of Systems    General:  No chills, fever, night sweats or weight changes.  Cardiovascular:  No chest pain, dyspnea on exertion, edema, orthopnea, palpitations, paroxysmal nocturnal dyspnea. Dermatological: No rash, lesions/masses Respiratory: No cough, dyspnea Urologic: No hematuria, dysuria Abdominal:   No nausea, vomiting, diarrhea, bright red blood per rectum, melena, or hematemesis Neurologic:  No visual changes, wkns,  changes in mental status. All other systems reviewed and are otherwise negative except as noted above.  Physical Exam    VS:  There were no vitals taken for this visit. , BMI There is no height or weight on file to calculate BMI. GEN: Well nourished, well developed, in no acute distress. HEENT: normal. Neck: Supple, no JVD, carotid bruits, or masses. Cardiac: RRR, no murmurs, rubs, or gallops. No clubbing, cyanosis, edema.  Radials/DP/PT 2+ and equal bilaterally.  Respiratory:  Respirations regular and unlabored, clear to auscultation bilaterally. GI: Soft, nontender, nondistended, BS + x 4. MS: no deformity or atrophy. Skin: warm and dry, no rash. Neuro:  Strength and sensation are intact. Psych: Normal affect.  Accessory Clinical Findings    Recent Labs: 08/10/2023: Magnesium 2.0 10/03/2023: ALT 32 10/15/2023: B Natriuretic Peptide 153.5; BUN 11; Creatinine, Ser 0.73; Hemoglobin 16.1; Platelets 287; Potassium 3.9; Sodium  135   Recent Lipid Panel No results found for: "CHOL", "TRIG", "HDL", "CHOLHDL", "VLDL", "LDLCALC", "LDLDIRECT"  No BP recorded.  {Refresh Note OR Click here to enter BP  :1}***    ECG personally reviewed by me today- ***    Echocardiogram 08/30/2022  IMPRESSIONS     1. Left ventricular ejection fraction, by estimation, is 65 to 70%. The  left ventricle has normal function. The left ventricle has no regional  wall motion abnormalities. There is mild concentric left ventricular  hypertrophy. Left ventricular diastolic  parameters are consistent with Grade I diastolic dysfunction (impaired  relaxation).   2. Right ventricular systolic function is normal. The right ventricular  size is normal. There is normal pulmonary artery systolic pressure.   3. The mitral valve is normal in structure. No evidence of mitral valve  regurgitation. No evidence of mitral stenosis.   4. Aortic valve gradients are mildly elevated and the aortic valve is  mildly calcified. By planimetry the aortic valve area is 3.23 cm^2,  indicating that there is no aortic stenosis. The aortic valve is  tricuspid. There is mild calcification of the  aortic valve. There is mild thickening of the aortic valve. Aortic valve  regurgitation is not visualized. Aortic valve sclerosis is present, with  no evidence of aortic valve stenosis. Aortic valve area, by VTI measures  2.84 cm. Aortic valve mean  gradient measures 15.0 mmHg. Aortic valve Vmax measures 2.66 m/s.   5. Aortic dilatation noted. There is mild dilatation of the aortic root,  measuring 36 mm. There is mild dilatation of the ascending aorta,  measuring 41 mm.   6. The inferior vena cava is normal in size with greater than 50%  respiratory variability, suggesting right atrial pressure of 3 mmHg.   FINDINGS   Left Ventricle: Left ventricular ejection fraction, by estimation, is 65  to 70%. The left ventricle has normal function. The left ventricle has no   regional wall motion abnormalities. Definity  contrast agent was given IV  to delineate the left ventricular   endocardial borders. The left ventricular internal cavity size was normal  in size. There is mild concentric left ventricular hypertrophy. Left  ventricular diastolic parameters are consistent with Grade I diastolic  dysfunction (impaired relaxation).  Normal left ventricular filling pressure.   Right Ventricle: The right ventricular size is normal. No increase in  right ventricular wall thickness. Right ventricular systolic function is  normal. There is normal pulmonary artery systolic pressure. The tricuspid  regurgitant velocity is 2.37 m/s, and   with an assumed right atrial pressure of 3 mmHg,  the estimated right  ventricular systolic pressure is 25.5 mmHg.   Left Atrium: Left atrial size was normal in size.   Right Atrium: Right atrial size was normal in size.   Pericardium: There is no evidence of pericardial effusion.   Mitral Valve: The mitral valve is normal in structure. No evidence of  mitral valve regurgitation. No evidence of mitral valve stenosis.   Tricuspid Valve: The tricuspid valve is normal in structure. Tricuspid  valve regurgitation is mild . No evidence of tricuspid stenosis.   Aortic Valve: Aortic valve gradients are mildly elevated and the aortic  valve is mildly calcified. By planimetry the aortic valve area is 3.23  cm^2, indicating that there is no aortic stenosis. The aortic valve is  tricuspid. There is mild calcification  of the aortic valve. There is mild thickening of the aortic valve. Aortic  valve regurgitation is not visualized. Aortic valve sclerosis is present,  with no evidence of aortic valve stenosis. Aortic valve mean gradient  measures 15.0 mmHg. Aortic valve  peak gradient measures 28.3 mmHg. Aortic valve area, by VTI measures 2.84  cm.   Pulmonic Valve: The pulmonic valve was normal in structure. Pulmonic valve   regurgitation is not visualized. No evidence of pulmonic stenosis.   Aorta: Aortic dilatation noted. There is mild dilatation of the aortic  root, measuring 36 mm. There is mild dilatation of the ascending aorta,  measuring 41 mm.   Venous: The inferior vena cava is normal in size with greater than 50%  respiratory variability, suggesting right atrial pressure of 3 mmHg.   IAS/Shunts: No atrial level shunt detected by color flow Doppler.     Coronary CTA 08/23/2022  FINDINGS: Vascular: No significant extracardiac vascular findings.   Mediastinum/Nodes: No lymphadenopathy.   Lungs/Pleura: There is a 1.0 cm pulmonary nodule in the right middle lobe, unchanged since December 2018 (series 15, image 24). There is an additional 6 mm pulmonary nodule in the right middle lobe, slightly decreased since December 2018 when it measured 8 mm (series 15, image 18). There are 2 pulmonary nodules in the right lower lobe, both measuring 6 mm, unchanged (series 15, images 6 and 10). There is bandlike atelectasis in the basilar lower lobes and lingula. Centrilobular emphysema.   Upper Abdomen: No acute abnormality.   Musculoskeletal: No acute osseous abnormality. No suspicious osseous lesion. No to level degenerative changes of the spine.   IMPRESSION: Stable pulmonary nodules in the right middle lobe and right lower lobe, the largest measuring 1.0 cm in the right middle lobe, unchanged since December 2018 and likely benign. No new pulmonary nodules   Emphysema. Bandlike atelectasis/scarring in the basilar lower lobes and lingula.     Electronically Signed   By: Jacob  Kahn M.D.   On: 08/23/2022 13:33    Addended by Twylla Galen, MD on 08/23/2022  1:35 PM    Study Result  Narrative & Impression  HISTORY: ECG abnormal, high CAD risk Dyspnea on exertion (DOE)   EXAM: Cardiac/Coronary  CT   TECHNIQUE: The patient was scanned on a [employer].   PROTOCOL: A 120 kV  prospective scan was triggered in the descending thoracic aorta at 111 HU's. Axial non-contrast 3 mm slices were carried out through the heart. The data set was analyzed on a dedicated work station and scored using the Agatston method. Gantry rotation speed was 250 msecs and collimation was .6 mm. Beta blockade and 0.8 mg of sl NTG was given. The 3D  data set was reconstructed in 5% intervals of the 35-75 % of the R-R cycle. Systolic and diastolic phases were analyzed on a dedicated work station using MPR, MIP and VRT modes. The patient received contrast: 100mL OMNIPAQUE  IOHEXOL  350 MG/ML SOLN.   FINDINGS: Image quality: Poor   Noise artifact is: Cardiac motion artifact, reduced contrast to nosie ratio and signal to noise ratio. Study quality reduces the sensitivity of this exam to detect small plaques and distal vessels.   Coronary calcium score is 1, which places the patient in the 38th percentile for age and sex matched control.   Coronary arteries: Normal coronary origins.  Left dominance.   Right Coronary Artery: No detectable plaque or stenosis.   Left Main Coronary Artery: No detectable plaque or stenosis.   Left Anterior Descending Coronary Artery: Minimal mixed atherosclerotic plaque in the proximal-mid LAD, <25% stenosis.   Left Circumflex Artery: No detectable plaque or stenosis.   Aorta: Normal size, 37 mm at the mid ascending aorta (level of the PA bifurcation) measured double oblique. No calcifications. No dissection.   Aortic Valve: Mild calcifications. AV calcium score 438   Other findings:   Normal pulmonary vein drainage into the left atrium.   Normal left atrial appendage without thrombus.   Borderline size of the pulmonary artery, 30 mm, incompletely visualized.   Please see separate report from [facility] for non-cardiac findings.   IMPRESSION: Suboptimal image quality reduces the sensitivity of this exam to detect small plaques and  limits evaluation of distal vessels.   1. Minimal CAD, <25% stenosis, CADRADS 1.   2. Coronary calcium score is 1, which places the patient in the 38th percentile for age and sex matched control.   3. Normal coronary origins with left dominance.   4. Mild aortic valve calcifications. AV calcium score 438   RECOMMENDATIONS: CAD-RADS 1. Minimal non-obstructive CAD (0-24%). Consider non-atherosclerotic causes of chest pain. Consider preventive therapy and risk factor modification.   Electronically Signed: By: Grady Lawman M.D. On: 08/23/2022 10:39        Assessment & Plan   1.  ***   Chet Cota. Tamon Parkerson NP-C     02/16/2024, 6:58 AM [facility] [address] Office [phone] Fax [phone]    I spent***minutes examining this patient, reviewing medications, and using patient centered shared decision making involving their cardiac care.   I spent  20 minutes reviewing past medical history,  medications, and prior cardiac tests.

## 2024-02-19 ENCOUNTER — Ambulatory Visit: Payer: Self-pay | Attending: General Practice | Admitting: General Practice

## 2024-02-20 ENCOUNTER — Encounter: Payer: Self-pay | Admitting: Internal Medicine

## 2024-02-20 ENCOUNTER — Encounter: Payer: Self-pay | Admitting: General Practice

## 2024-04-01 ENCOUNTER — Other Ambulatory Visit: Payer: Self-pay

## 2024-04-01 ENCOUNTER — Telehealth: Payer: Self-pay | Admitting: Internal Medicine

## 2024-04-01 MED ORDER — FUROSEMIDE 20 MG PO TABS: 20.0000 mg | ORAL_TABLET | Freq: Every day | ORAL | 0 refills | Status: DC

## 2024-04-01 MED ORDER — LOSARTAN POTASSIUM 50 MG PO TABS: 50.0000 mg | ORAL_TABLET | Freq: Every day | ORAL | 0 refills | Status: DC

## 2024-04-01 MED ORDER — METOPROLOL SUCCINATE ER 25 MG PO TB24
25.0000 mg | ORAL_TABLET | Freq: Every day | ORAL | 0 refills | Status: DC
Start: 2024-04-01 — End: 2024-04-16

## 2024-04-01 NOTE — Telephone Encounter (Signed)
*  STAT* If patient is at the pharmacy, call can be transferred to refill team.   1. Which medications need to be refilled? (please list name of each medication and dose if known)   furosemide  (LASIX ) 20 MG tablet    losartan  (COZAAR ) 50 MG tablet    metoprolol  succinate (TOPROL -XL) 25 MG 24 hr tablet     2. Would you like to learn more about the convenience, safety, & potential cost savings by using the [facility]? No   3. Are you open to using the [facility] (Type [facility] ) No   4. Which pharmacy/location (including street and city if local pharmacy) is medication to be sent to? [id number] [place], [place] - [address] Phone: [phone]  Fax: [phone]     5. Do they need a 30 day or 90 day supply? 90 day  Pt has scheduled appt on 7/15

## 2024-04-01 NOTE — Telephone Encounter (Signed)
 [facility] day [id number]

## 2024-04-09 NOTE — Progress Notes (Signed)
 Cardiology Office Note:  .   Date:  04/16/2024  ID:  Ray Griffin Griffin, DOB 03-31-1966, MRN [id number] PCP: Tanda Bleacher, MD  [facility] Providers Cardiologist:  Georganna Archer, MD    History of Present Illness: Ray Griffin   Ray Griffin Griffin is a 58 y.o. male  with a hx of anxiety, COPD, HTN, smoker, palpitations, PVCs    Patient says he has good and bad days. He works as a Financial risk analyst at [employer]. His legs swell when he's on his feet all day. He has chronic DOE with O2 sats dropping to 87 when he moves around in his house. Quit smoking 3 months ago. Hasn't seen pulmonary since 2019. No regular exercise. Can feel PVC's, worse in the am.   ROS:    Studies Reviewed: Ray Griffin         Prior CV Studies:    In 2012, had a stress echo, 9 METs. Hypertensive responses 217/115 mmHg. EF normal. Mild LVH.  Stress echo suggested possible inducible ischemia in the septum. Cath 06/09/2011  showed normal CORs.    [facility] 08/22/2022- PVC burden 4.9%   Coronary CTA-38th percentile for age, CAC of 1   TTE 08/30/2022-  IMPRESSIONS     1. Left ventricular ejection fraction, by estimation, is 65 to 70%. The  left ventricle has normal function. The left ventricle has no regional  wall motion abnormalities. There is mild concentric left ventricular  hypertrophy. Left ventricular diastolic  parameters are consistent with Grade I diastolic dysfunction (impaired  relaxation).   2. Right ventricular systolic function is normal. The right ventricular  size is normal. There is normal pulmonary artery systolic pressure.   3. The mitral valve is normal in structure. No evidence of mitral valve  regurgitation. No evidence of mitral stenosis.   4. Aortic valve gradients are mildly elevated and the aortic valve is  mildly calcified. By planimetry the aortic valve area is 3.23 cm^2,  indicating that there is no aortic stenosis. The aortic valve is  tricuspid. There is mild calcification of the  aortic valve. There is  mild thickening of the aortic valve. Aortic valve  regurgitation is not visualized. Aortic valve sclerosis is present, with  no evidence of aortic valve stenosis. Aortic valve area, by VTI measures  2.84 cm. Aortic valve mean  gradient measures 15.0 mmHg. Aortic valve Vmax measures 2.66 m/s.   5. Aortic dilatation noted. There is mild dilatation of the aortic root,  measuring 36 mm. There is mild dilatation of the ascending aorta,  measuring 41 mm.   6. The inferior vena cava is normal in size with greater than 50%  respiratory variability, suggesting right atrial pressure of 3 mmHg.      Risk Assessment/Calculations:             Physical Exam:   VS:  BP 134/76   Pulse 69   Ht 6' 1 (1.854 m)   Wt 279 lb 3.2 oz (126.6 kg)   SpO2 97%   BMI 36.84 kg/m    Orhtostatics: No data found. Wt Readings from Last 3 Encounters:  04/16/24 279 lb 3.2 oz (126.6 kg)  10/15/23 259 lb 14.8 oz (117.9 kg)  10/03/23 259 lb 14.8 oz (117.9 kg)    GEN: Obesity, in no acute distress NECK: No JVD; No carotid bruits CARDIAC: RRR, no murmurs, rubs, gallops RESPIRATORY:  decreased breath sounds with scattered rhonchi ABDOMEN: Soft, non-tender, non-distended EXTREMITIES:  No edema; No deformity   ASSESSMENT AND  PLAN: .    Palpitations with PVC's 4.6% burden-can feel them mostly in am. Continue toprol  xl 25 mg once daily. With COPD I don't want to increase at this time.   HTN-controlled on losartan , toprol , lasix .  LE edema when on his feet all day, goes down in the am. Compression hose, 2 gm sodium diet, low dose lasix . Labs in Jan reviewed and stable. Echo in 2023 normal LVEF, grade 1DD.  COPD/chronic DOE-getting his inhalers from a friend. Hasn't seen pulmonary since 2019. Will refer. May also need sleep study. He wants to discuss with pulmonary. Congratulated him on smoking cessation.  Obesity-has gained 20 lbs since Jan. Exercise and weight loss discussed.           Dispo: f/u in 1  yr  Signed, Olivia Pavy, PA-C

## 2024-04-16 ENCOUNTER — Ambulatory Visit: Payer: Self-pay | Attending: Physician Assistant | Admitting: Physician Assistant

## 2024-04-16 ENCOUNTER — Encounter: Payer: Self-pay | Admitting: Physician Assistant

## 2024-04-16 VITALS — BP 134/76 | HR 69 | Ht 73.0 in | Wt 279.2 lb

## 2024-04-16 DIAGNOSIS — R0602 Shortness of breath: Secondary | ICD-10-CM

## 2024-04-16 DIAGNOSIS — I10 Essential (primary) hypertension: Secondary | ICD-10-CM

## 2024-04-16 DIAGNOSIS — I493 Ventricular premature depolarization: Secondary | ICD-10-CM

## 2024-04-16 DIAGNOSIS — J449 Chronic obstructive pulmonary disease, unspecified: Secondary | ICD-10-CM

## 2024-04-16 DIAGNOSIS — R002 Palpitations: Secondary | ICD-10-CM

## 2024-04-16 DIAGNOSIS — R600 Localized edema: Secondary | ICD-10-CM

## 2024-04-16 MED ORDER — METOPROLOL SUCCINATE ER 25 MG PO TB24: 25.0000 mg | ORAL_TABLET | Freq: Every day | ORAL | 3 refills | Status: AC

## 2024-04-16 MED ORDER — LOSARTAN POTASSIUM 50 MG PO TABS: 50.0000 mg | ORAL_TABLET | Freq: Every day | ORAL | 3 refills | Status: AC

## 2024-04-16 MED ORDER — FUROSEMIDE 20 MG PO TABS: 20.0000 mg | ORAL_TABLET | Freq: Every day | ORAL | 3 refills | Status: AC

## 2024-04-16 NOTE — Patient Instructions (Signed)
 Medication Instructions:  Your physician recommends that you continue on your current medications as directed. Please refer to the Current Medication list given to you today.  *If you need a refill on your cardiac medications before your next appointment, please call your pharmacy*  Lab Work: COME TO [facility] ON THE 1ST FLOOR, FASTING, FOR:  LIPID  If you have labs (blood work) drawn today and your tests are completely normal, you will receive your results only by: MyChart Message (if you have MyChart) OR A paper copy in the mail If you have any lab test that is abnormal or we need to change your treatment, we will call you to review the results.  Testing/Procedures: None ordered  You have been referred to PULMONOLOGY    Follow-Up: At [facility], you and your health needs are our priority.  As part of our continuing mission to provide you with exceptional heart care, our providers are all part of one team.  This team includes your primary Cardiologist (physician) and Advanced Practice Providers or APPs (Physician Assistants and Nurse Practitioners) who all work together to provide you with the care you need, when you need it.  Your next appointment:   12 month(s)  Provider:   Georganna Archer, MD    We recommend signing up for the patient portal called MyChart.  Sign up information is provided on this After Visit Summary.  MyChart is used to connect with patients for Virtual Visits (Telemedicine).  Patients are able to view lab/test results, encounter notes, upcoming appointments, etc.  Non-urgent messages can be sent to your provider as well.   To learn more about what you can do with MyChart, go to [email or web address].   Other Instructions

## 2024-07-09 ENCOUNTER — Ambulatory Visit (INDEPENDENT_AMBULATORY_CARE_PROVIDER_SITE_OTHER): Payer: Self-pay | Admitting: Pulmonary Disease

## 2024-07-09 ENCOUNTER — Encounter: Payer: Self-pay | Admitting: Pulmonary Disease

## 2024-07-09 VITALS — BP 144/94 | HR 62 | Temp 97.7°F | Ht 73.0 in | Wt 277.0 lb

## 2024-07-09 DIAGNOSIS — J449 Chronic obstructive pulmonary disease, unspecified: Secondary | ICD-10-CM

## 2024-07-09 DIAGNOSIS — J948 Other specified pleural conditions: Secondary | ICD-10-CM

## 2024-07-09 DIAGNOSIS — F17210 Nicotine dependence, cigarettes, uncomplicated: Secondary | ICD-10-CM

## 2024-07-09 DIAGNOSIS — J439 Emphysema, unspecified: Secondary | ICD-10-CM

## 2024-07-09 DIAGNOSIS — J4489 Other specified chronic obstructive pulmonary disease: Secondary | ICD-10-CM

## 2024-07-09 MED ORDER — UMECLIDINIUM-VILANTEROL 62.5-25 MCG/ACT IN AEPB: 1.0000 | INHALATION_SPRAY | Freq: Every day | RESPIRATORY_TRACT | 11 refills | Status: AC

## 2024-07-09 MED ORDER — NICOTINE 14 MG/24HR TD PT24: 14.0000 mg | MEDICATED_PATCH | Freq: Every day | TRANSDERMAL | 0 refills | Status: AC

## 2024-07-09 MED ORDER — VARENICLINE TARTRATE (STARTER) 0.5 MG X 11 & 1 MG X 42 PO TBPK: 1.0000 | ORAL_TABLET | Freq: Every day | ORAL | 2 refills | Status: AC

## 2024-07-09 NOTE — Patient Instructions (Addendum)
  VISIT SUMMARY: Today, we evaluated your severe COPD and emphysema, discussed your long-term tobacco use, and addressed your sleep disturbances and possible asbestos exposure.  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 Your symptoms include daily shortness of breath, occasional wheezing, and a chronic cough. -We will order a repeat lung function test to assess your current condition. -You are prescribed an Anoro inhaler to help with bronchodilation. Please use it as directed. -If available, we will provide you with a sample inhaler.  TOBACCO USE DISORDER: You have a long history of smoking about a pack per day for nearly 40 years, with multiple attempts to quit. -You are prescribed Chantix and nicotine  patches to help you quit smoking. -Please check the cost of these medications at your pharmacy and use discount cards if available. -Consider applying for patient assistance programs to help with the cost. - We will refer you for annual screening CTs of the chest  SUSPECTED OBSTRUCTIVE SLEEP APNEA: You have symptoms that suggest sleep apnea, including poor sleep quality, snoring, and daytime sleepiness. -We discussed the possibility of a home sleep study if your financial situation improves.  PLEURAL CALCIFICATIONS WITH POSSIBLE PRIOR ASBESTOS EXPOSURE: Your CT scan shows pleural calcifications, which may indicate past asbestos exposure. There is no current evidence of asbestosis. -We will monitor your condition for any signs of progression or development of asbestosis.

## 2024-07-09 NOTE — Progress Notes (Signed)
 RASMUS PREUSSER    [id number]    04/03/1966  Primary Care Physician:Wilson, Raguel, MD  Referring Physician: Parthenia Olivia HERO, PA-C [address] [place],  [place] [address]  Chief complaint:  Consult for COPD  HPI: 58 y.o. who  has a past medical history of Anxiety, COPD (chronic obstructive pulmonary disease) (HCC), Emphysema, Hypertension, and Tachycardia.  Discussed the use of AI scribe software for clinical note transcription with the patient, who gave verbal consent to proceed.  History of Present Illness PETRO TALENT is a 58 year old male with severe COPD and emphysema who presents for evaluation of his lung condition. He was referred by his cardiologist for evaluation of his lung condition.  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 - Subsequent diagnosis of COPD - Last pulmonary function test performed in 2019 - Daily dyspnea present - Occasional wheezing - Chronic cough and morning congestion - Current use of albuterol  as needed - Previously used Spiriva, discontinued due to lack of follow-up  Tobacco use - Approximately one pack per day smoking history for nearly forty years - Multiple cessation attempts with relapses related to stress  Sleep disturbance and suspected sleep apnea - Variable sleep quality with frequent awakenings at 3 AM - Suspected snoring - Occasional nocturnal dyspnea - Daytime somnolence present - Suspicion for sleep apnea  Occupational and environmental exposures - Works as a Financial risk analyst - No known asbestos exposure, though prior ER visit documented asbestos-related lung disease - 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  No feather pillows or comforter No h/o chemo/XRT/amiodarone/macrodantin/MTX  No exposure to asbestos, silica or other organic allergens  Smoking history: 30-pack-year smoker.  Continues to smoke 1 pack/day Travel history: Originally from [place] .  No significant recent travel Family history: Father has emphysema and lung cancer.  He is a smoker.   Outpatient Encounter Medications as of 07/09/2024  Medication Sig   albuterol  (PROVENTIL ) (2.5 MG/3ML) 0.083% nebulizer solution Take 2.5 mg by nebulization every 6 (six) hours as needed for wheezing or shortness of breath.   albuterol  (VENTOLIN  HFA) 108 (90 Base) MCG/ACT inhaler Inhale 1-2 puffs into the lungs every 6 (six) hours as needed for wheezing or shortness of breath (or cough).   furosemide  (LASIX ) 20 MG tablet Take 1 tablet (20 mg total) by mouth daily.   losartan  (COZAAR ) 50 MG tablet Take 1 tablet (50 mg total) by mouth daily.   metoprolol  succinate (TOPROL -XL) 25 MG 24 hr tablet Take 1 tablet (25 mg total) by mouth daily. Take with or immediately following a meal.   Pseudoephedrine-APAP-DM (TYLENOL  COLD/FLU DAY) 30-500-15 MG TABS Take 1 tablet by mouth every 4 (four) hours as needed (cold symptoms).   amoxicillin -clavulanate (AUGMENTIN ) 875-125 MG tablet Take 1 tablet by mouth every 12 (twelve) hours. (Patient not taking: Reported on 07/09/2024)   predniSONE  (DELTASONE ) 10 MG tablet Take 5 tablets (50 mg total) by mouth daily. (Patient not taking: Reported on 07/09/2024)   No facility-administered encounter medications on file as of 07/09/2024.     Physical Exam: Today's Vitals   07/09/24 0824  BP: (!) 144/94  Pulse: 62  Temp: 97.7 F (  36.5 C)  TempSrc: Oral  SpO2: 98%  Weight: 277 lb (125.6 kg)  Height: 6' 1 (1.854 m)   Body mass index is 36.55 kg/m.  Physical Exam GEN: No acute distress. CV: Regular rate and rhythm, no murmurs. LUNGS: Clear to auscultation bilaterally, normal respiratory  effort. SKIN JOINTS: Warm and dry, no rash.    Data Reviewed: Imaging: CTA 12/31/2022-no pulmonary embolism, multiple calcified pleural plaques.  Areas of atelectasis, scarring bilaterally.  Diffuse bronchial wall thickening with moderate to severe emphysema.  Right middle lobe pulmonary nodule measuring 5 mm and 10 mm.  Other pulmonary nodules are stable. I have reviewed the images personally.  PFTs: 10/17/2017 FVC 2.95 [55%], FEV1 2.00 [47%], F/F67 Severe obstruction  Labs: CBC 10/15/2023-WBC 11.2, eos 2%, absolute eosinophil count 224  Assessment & Plan Severe chronic obstructive pulmonary disease (COPD) with emphysema Severe COPD with emphysema confirmed by lung function tests in 2019. Symptoms include daily dyspnea, occasional wheezing, and cough. CT scan shows severe emphysema. Inhaled corticosteroids not indicated due to low eosinophil count. - Order repeat lung function test - Prescribe Anoro inhaler for bronchodilation - Provide sample inhaler if available  Tobacco use disorder Long-standing tobacco use disorder with smoking a pack per day for nearly 40 years. Previous cessation attempts unsuccessful. Financial constraints due to lack of insurance may limit access to medications. - Prescribe Chantix and nicotine  patches.  Time spent counseling-5 minutes.  Reassess at return visit. - Instruct to check cost with pharmacy and use discount cards if available - Encourage application for patient assistance programs - Refer for low-dose lung cancer screening  Suspected obstructive sleep apnea Suspected obstructive sleep apnea based on poor sleep quality, snoring, and daytime somnolence. Financial constraints may limit ability to perform a home sleep study. - Discuss potential for home sleep study if financial situation improves  Pleural calcifications with possible prior asbestos exposure Pleural calcifications on CT scan suggest possible prior asbestos exposure. No current  evidence of asbestosis. No known occupational exposure, but possible incidental exposure in the past. - Monitor for any progression or development of asbestosis  Recommendations: PFTs, start Anoro Smoking cessation with Chantix, nicotine  Home sleep study Referral for lung cancer screening  I personally spent a total of 47 minutes in the care of the patient today including preparing to see the patient, getting/reviewing separately obtained history, performing a medically appropriate exam/evaluation, counseling and educating, placing orders, documenting clinical information in the EHR, and independently interpreting results.   Sharlon Pfohl MD [facility] 07/09/2024, 8:30 AM  CC: Parthenia Olivia HERO, PA-C

## 2024-07-30 ENCOUNTER — Telehealth: Payer: Self-pay | Admitting: Acute Care

## 2024-07-30 DIAGNOSIS — Z122 Encounter for screening for malignant neoplasm of respiratory organs: Secondary | ICD-10-CM

## 2024-07-30 DIAGNOSIS — F17210 Nicotine dependence, cigarettes, uncomplicated: Secondary | ICD-10-CM

## 2024-07-30 DIAGNOSIS — Z87891 Personal history of nicotine dependence: Secondary | ICD-10-CM

## 2024-07-30 NOTE — Telephone Encounter (Signed)
 Lung Cancer Screening Narrative/Criteria Questionnaire (Cigarette Smokers Only- No Cigars/Pipes/vapes)   Ray Griffin   SDMV:08/19/2024 1:30 Ray Griffin PATIENT       08-20-1966   LDCT: 08/20/2024 4:00pm GI    58 y.o.   Phone: [phone]  Lung Screening Narrative (confirm age 47-77 yrs Medicare / 50-80 yrs Private pay insurance)   Insurance information:Uninsured    Referring Provider:Dr. Theophilus    This screening involves an initial phone call with a team member from our program. It is called a shared decision making visit. The initial meeting is required by  insurance and Medicare to make sure you understand the program. This appointment takes about 15-20 minutes to complete. You will complete the screening scan at your scheduled date/time.  This scan takes about 5-10 minutes to complete. You can eat and drink normally before and after the scan.  Criteria questions for Lung Cancer Screening:   Are you a current or former smoker? Current Age began smoking: 58yo   If you are a former smoker, what year did you quit smoking? N/A(within 15 yrs)   To calculate your smoking history, I need an accurate estimate of how many packs of cigarettes you smoked per day and for how many years. (Not just the number of PPD you are now smoking)   Years smoking 40 x Packs per day 1.5 = Pack years 60   (at least 20 pack yrs)   (Make sure they understand that we need to know how much they have smoked in the past, not just the number of PPD they are smoking now)  Do you have a personal history of cancer?  No    Do you have a family history of cancer? Yes  (cancer type and and relative) Mother - kidney, father - lung   Are you coughing up blood?  No  Have you had unexplained weight loss of 15 lbs or more in the last 6 months? No  It looks like you meet all criteria.  When would be a good time for us  to schedule you for this screening?   Additional information: N/A

## 2024-08-12 ENCOUNTER — Ambulatory Visit: Payer: Self-pay

## 2024-08-12 DIAGNOSIS — J4489 Other specified chronic obstructive pulmonary disease: Secondary | ICD-10-CM

## 2024-08-19 ENCOUNTER — Other Ambulatory Visit: Payer: Self-pay

## 2024-08-19 DIAGNOSIS — F17210 Nicotine dependence, cigarettes, uncomplicated: Secondary | ICD-10-CM

## 2024-08-19 NOTE — Progress Notes (Signed)
 Virtual Visit via Video Note  I connected with Ray Griffin on 08/19/24 at  1:30 PM EST by a video enabled telemedicine application and verified that I am speaking with the correct person using two identifiers.  Location: Patient: in home Provider: [address] [place], [place], [address]   Shared Decision Making Visit Lung Cancer Screening Program [phone]) Pt reminded to take blue card with them to appointment   Eligibility: Age 58 y.o. Pack Years Smoking History Calculation 30 (# packs/per year x # years smoked) Recent History of coughing up blood  no Unexplained weight loss? no ( >Than 15 pounds within the last 6 months ) Prior History Lung / other cancer no (Diagnosis within the last 5 years already requiring surveillance chest CT Scans). Smoking Status Current Smoker Former Smokers: Years since quit:  NA  Quit Date: NA  Visit Components: Discussion included one or more decision making aids. yes Discussion included risk/benefits of screening. yes Discussion included potential follow up diagnostic testing for abnormal scans. yes Discussion included meaning and risk of over diagnosis. yes Discussion included meaning and risk of False Positives. yes Discussion included meaning of total radiation exposure. yes  Counseling Included: Importance of adherence to annual lung cancer LDCT screening. yes Impact of comorbidities on ability to participate in the program. yes Ability and willingness to under diagnostic treatment. yes  Smoking Cessation Counseling: Current Smokers:  Discussed importance of smoking cessation. yes Information about tobacco cessation classes and interventions provided to patient. yes Patient provided with ticket for LDCT Scan. yes Symptomatic Patient. no  Counseling NA Diagnosis Code: Tobacco Use Z72.0 Asymptomatic Patient yes  Counseling (Intermediate counseling: > three minutes counseling) H9563  Counseled patient 4 minutes regarding  tobacco use.   Former Smokers:  Discussed the importance of maintaining cigarette abstinence. yes Diagnosis Code: Personal History of Nicotine  Dependence. S12.108 Information about tobacco cessation classes and interventions provided to patient. Yes Patient provided with ticket for LDCT Scan. yes Written Order for Lung Cancer Screening with LDCT placed in Epic. Yes (CT Chest Lung Cancer Screening Low Dose W/O CM) [id number] Z12.2-Screening of respiratory organs Z87.891-Personal history of nicotine  dependence   Josette Ranger, RN 08/19/24

## 2024-08-19 NOTE — Patient Instructions (Signed)

## 2024-08-20 ENCOUNTER — Inpatient Hospital Stay
Admission: RE | Admit: 2024-08-20 | Discharge: 2024-08-20 | Disposition: A | Payer: Self-pay | Source: Ambulatory Visit | Attending: Acute Care | Admitting: Acute Care

## 2024-08-20 DIAGNOSIS — Z87891 Personal history of nicotine dependence: Secondary | ICD-10-CM

## 2024-08-20 DIAGNOSIS — Z122 Encounter for screening for malignant neoplasm of respiratory organs: Secondary | ICD-10-CM

## 2024-08-20 DIAGNOSIS — F17210 Nicotine dependence, cigarettes, uncomplicated: Secondary | ICD-10-CM

## 2024-08-26 ENCOUNTER — Telehealth: Payer: Self-pay | Admitting: Acute Care

## 2024-08-26 DIAGNOSIS — R911 Solitary pulmonary nodule: Secondary | ICD-10-CM

## 2024-08-26 DIAGNOSIS — Z87891 Personal history of nicotine dependence: Secondary | ICD-10-CM

## 2024-08-26 NOTE — Telephone Encounter (Signed)
 Call report received on pt's LDCT performed on 08/20/2024. Impression below.    IMPRESSION: 1. Lung-RADS 4A, suspicious. Follow up low-dose chest CT without contrast in 3 months (please use the following order, CT CHEST LCS NODULE FOLLOW-UP W/O CM) is recommended. Several new bilateral solid pulmonary nodules, largest 7.6 mm in the medial left upper lobe on image 128. 2. Extensive scattered bilateral calcified pleural plaques, unchanged, compatible with asbestos related pleural disease. No pleural effusions. 3.  Emphysema (ICD10-J43.9).   These results will be called to the ordering clinician or representative by the Radiologist Assistant, and communication documented in the PACS or [employer].     Electronically Signed   By: Selinda DELENA Blue M.D.   On: 08/23/2024 18:42

## 2024-08-26 NOTE — Telephone Encounter (Signed)
 LR 4 A. This is ok for a 3 month follow up. Please ask if he has had any weight loss or blood in his sputum  when he coughs. If he answers yes to either , please let me know.  Scan due after 11/20/2024. Please fax results to PCP and let them know the plan of care. Thanks so much

## 2024-08-26 NOTE — Telephone Encounter (Signed)
 Called patient to review recent lung CT results. Request call back at 4pm as he was currently working.

## 2024-08-27 NOTE — Telephone Encounter (Signed)
 Spoke with patient and reviewed lung screening CT results. Patient denies any recent hemoptysis or weight loss. Advised patient that a few nodules were see and a 3 month follow up CT has been recommended. Patient verbalized understanding and would like a call back closer to 3 months to schedule follow up CT since he is at work. Results. Plans sent to PCP. Order placed for 3 month nodule follow up CT.

## 2024-08-27 NOTE — Addendum Note (Signed)
 Addended by: ANITRA AQUAS D on: 08/27/2024 08:36 AM   Modules accepted: Orders

## 2024-09-04 ENCOUNTER — Encounter: Payer: Self-pay | Admitting: Pulmonary Disease

## 2024-09-04 ENCOUNTER — Telehealth (HOSPITAL_COMMUNITY): Payer: Self-pay

## 2024-09-04 ENCOUNTER — Ambulatory Visit: Payer: Self-pay

## 2024-09-04 ENCOUNTER — Ambulatory Visit (INDEPENDENT_AMBULATORY_CARE_PROVIDER_SITE_OTHER): Payer: Self-pay | Admitting: Pulmonary Disease

## 2024-09-04 VITALS — BP 165/77 | HR 61 | Temp 97.6°F | Ht 73.0 in | Wt 283.0 lb

## 2024-09-04 DIAGNOSIS — F17210 Nicotine dependence, cigarettes, uncomplicated: Secondary | ICD-10-CM

## 2024-09-04 DIAGNOSIS — J441 Chronic obstructive pulmonary disease with (acute) exacerbation: Secondary | ICD-10-CM

## 2024-09-04 MED ORDER — PREDNISONE 10 MG PO TABS: 40.0000 mg | ORAL_TABLET | Freq: Every day | ORAL | 0 refills | Status: AC

## 2024-09-04 MED ORDER — AZITHROMYCIN 250 MG PO TABS: 250.0000 mg | ORAL_TABLET | Freq: Every day | ORAL | 0 refills | Status: AC

## 2024-09-04 NOTE — Patient Instructions (Signed)
  VISIT SUMMARY: You visited us  today due to worsening cough and fatigue related to your severe COPD and emphysema. We discussed your symptoms, current medications, and potential treatment options to help manage your condition.  YOUR PLAN: COPD WITH ACUTE EXACERBATION: You are experiencing a mild exacerbation of your COPD with increased shortness of breath and productive cough. -Continue using your Anoro inhaler as prescribed. -Take the Z-Pak (azithromycin ) and prednisone  for 5 days as prescribed. -We have ordered a lung function test (PFT) to be done in three months. -Consider using an Otowear nebulizer for symptom management. -Explore pulmonary rehabilitation options, including both in-person and virtual options.  SEVERE EMPHYSEMA: Your severe emphysema, diagnosed in 2019, appears to have worsened, likely due to continued smoking. -We have ordered a lung function test (PFT) to be done in three months. -Explore pulmonary rehabilitation options, including both in-person and virtual options.  PULMONARY NODULES UNDER SURVEILLANCE: New pulmonary nodules were identified on your recent CT scan. These will need to be monitored. -Ensure you have a follow-up CT scan scheduled for February 2026 to monitor the nodules.  PLEURAL PLAQUES DUE TO ASBESTOS EXPOSURE: Pleural thickening and calcification were noted on your CT scan, likely due to past asbestos exposure. -We will continue to monitor these pleural changes with follow-up imaging as needed.

## 2024-09-04 NOTE — Telephone Encounter (Signed)
 FYI Only or Action Required?: FYI only for provider: appointment scheduled on 09/04/24.  Patient is followed in Pulmonology for COPD, last seen on 07/09/2024 by Mannam, Praveen, MD.  Called Nurse Triage reporting Cough.  Symptoms began several days ago.  Interventions attempted: Nothing.  Symptoms are: stable.  Triage Disposition: See Physician Within 24 Hours  Patient/caregiver understands and will follow disposition?: Yes   E2C2 Pulmonary Triage - Initial Assessment Questions "Chief Complaint (e.g., cough, sob, wheezing, fever, chills, sweat or additional symptoms) *Go to specific symptom protocol after initial questions.   "How long have symptoms been present?" 3-4 days  Have you tested for COVID or Flu? Note: If not, ask patient if a home test can be taken. If so, instruct patient to call back for positive results. No  MEDICINES:   "Have you used any OTC meds to help with symptoms?" No If yes, ask "What medications?" N/a  "Have you used your inhalers/maintenance medication?" No If yes, "What medications?" N/a  If inhaler, ask "How many puffs and how often?" Note: Review instructions on medication in the chart. N/a  OXYGEN: "Do you wear supplemental oxygen?" No If yes, "How many liters are you supposed to use?" N/a  "Do you monitor your oxygen levels?" Yes If yes, What is your reading (oxygen level) today? 94%  What is your usual oxygen saturation reading?  (Note: Pulmonary O2 sats should be 90% or greater) 94%  Reason for Disposition  [1] Known COPD or other severe lung disease (i.e., bronchiectasis, cystic fibrosis, lung surgery) AND [2] symptoms getting worse (i.e., increased sputum purulence or amount, increased breathing difficulty  Answer Assessment - Initial Assessment Questions O2 level on the phone is 94% HR 68. Has not tested for covid for flu.   1. ONSET: When did the cough begin?      Sunday  2. SEVERITY: How bad is the cough today?       Worsening  3. SPUTUM: Describe the color of your sputum (e.g., none, dry cough; clear, white, yellow, green)     Yellow  4. HEMOPTYSIS: Are you coughing up any blood? If Yes, ask: How much? (e.g., flecks, streaks, tablespoons, etc.)     Denies  5. DIFFICULTY BREATHING: Are you having difficulty breathing? If Yes, ask: How bad is it? (e.g., mild, moderate, severe)      Mild SOB is baseline, worsening with chest congestion  6. FEVER: Do you have a fever? If Yes, ask: What is your temperature, how was it measured, and when did it start?     Denies  7. CARDIAC HISTORY: Do you have any history of heart disease? (e.g., heart attack, congestive heart failure)      Left side heart failure  8. LUNG HISTORY: Do you have any history of lung disease?  (e.g., pulmonary embolus, asthma, emphysema)     Emphysema  10. OTHER SYMPTOMS: Do you have any other symptoms? (e.g., runny nose, wheezing, chest pain)       Weak, fatigued  Protocols used: Cough - Acute Productive-A-AH  Copied from CRM #[id number]. Topic: Clinical - Red Word Triage >> Sep 04, 2024  8:07 AM Leila C wrote: Red Word that prompted transfer to Nurse Triage: Patient [phone] states feeling sick, chest cold a few days now, symptoms are chest feels full, coughing phlegm yellowish, feels worn out- tired, pain in left ribcage, wheezing, shortness of breath worse when walking around, and unstable at times with balance. Patient denies fever and oxygen level is decent. Patient wants  to see Dr. Theophilus for antibiotics and Prednisone . Please advise.    ----------------------------------------------------------------------- From previous Reason for Contact - Scheduling: Patient/patient representative is calling to schedule an appointment. Refer to attachments for appointment information.

## 2024-09-04 NOTE — Telephone Encounter (Signed)
 Patient seen in office

## 2024-09-04 NOTE — Telephone Encounter (Signed)
 Called Pt to see if he was interested in Pulmonary Rehab.  Pt is not interested being that he does not have Medical Insurance.

## 2024-09-04 NOTE — Progress Notes (Signed)
**Note Ray-Identified via Obfuscation**  DEMORIO Griffin    [id number]    1965/12/14  Primary Care Physician:Wilson, Raguel, MD  Referring Physician: Tanda Raguel, MD [address] [place],  [place] [address]  Chief complaint: Follow-up for severe COPD, lung nodules, pleural plaques  HPI: 58 y.o. who  has a past medical history of Anxiety, COPD (chronic obstructive pulmonary disease) (HCC), Emphysema, Hypertension, and Tachycardia.  Discussed the use of AI scribe software for clinical note transcription with the patient, who gave verbal consent to proceed.  History of Present Illness Ray Griffin is a 58 year old male with severe COPD and emphysema who presents for evaluation of his lung condition. He was referred by his cardiologist for evaluation of his lung condition.  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 - Subsequent diagnosis of COPD - Last pulmonary function test performed in 2019 - Daily dyspnea present - Occasional wheezing - Chronic cough and morning congestion - Current use of albuterol  as needed - Previously used Spiriva, discontinued due to lack of follow-up  Tobacco use - Approximately one pack per day smoking history for nearly forty years - Multiple cessation attempts with relapses related to stress  Sleep disturbance and suspected sleep apnea - Variable sleep quality with frequent awakenings at 3 AM - Suspected snoring - Occasional nocturnal dyspnea - Daytime somnolence present - Suspicion for sleep apnea  Occupational and environmental exposures - Works as a financial risk analyst - No known asbestos exposure, though prior ER visit documented asbestos-related lung disease - 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  Cough and sputum production - Productive cough with yellow sputum, worst yesterday and last night, with mild improvement today - Intermittent wheezing at night when lying down  Dyspnea and fatigue - Significant fatigue with short walks causing exhaustion and prolonged recovery - Missed work for the past two days due to fatigue - Concern that breathing may prevent him from working; considering disability if no improvement - More frequent bad respiratory days over the past six months  Chronic obstructive pulmonary disease and emphysema - Severe emphysema with centrilobular changes and bronchial wall thickening - Spirometry in 2019 showed lung function at 47% - No repeat lung function testing since 2019 - Uses Anoro one puff daily for the past couple of months without clear benefit  Tobacco use - Continues to smoke; attempting to cut down but unable to quit - Not using Chantix  due to cost  Pulmonary nodules and pleural disease - CT on August 20, 2024 showed a new 7.6 mm lung nodule; other nodules stable - Repeat CT planned in three months - Pleural thickening and calcification present - History of pneumothorax in 2004 treated with pleurodesis  Financial barriers to care - No insurance; concerned about ability to pay for medications and tests - Uses blue card assistance program, which covered recent CT scan   Relevant Pulmonary history: Pets: No pets 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  No feather pillows or comforter No h/o chemo/XRT/amiodarone/macrodantin/MTX  No exposure to asbestos, silica or other organic allergens  Smoking history: 30-pack-year smoker.  Continues to smoke  1 pack/day Travel history: Originally from [place] .  No significant recent travel Family history: Father has emphysema and lung cancer.  He is a smoker.   Outpatient Encounter  Medications as of 09/04/2024  Medication Sig   albuterol  (PROVENTIL ) (2.5 MG/3ML) 0.083% nebulizer solution Take 2.5 mg by nebulization every 6 (six) hours as needed for wheezing or shortness of breath.   albuterol  (VENTOLIN  HFA) 108 (90 Base) MCG/ACT inhaler Inhale 1-2 puffs into the lungs every 6 (six) hours as needed for wheezing or shortness of breath (or cough).   furosemide  (LASIX ) 20 MG tablet Take 1 tablet (20 mg total) by mouth daily.   losartan  (COZAAR ) 50 MG tablet Take 1 tablet (50 mg total) by mouth daily.   metoprolol  succinate (TOPROL -XL) 25 MG 24 hr tablet Take 1 tablet (25 mg total) by mouth daily. Take with or immediately following a meal.   Pseudoephedrine-APAP-DM (TYLENOL  COLD/FLU DAY) 30-500-15 MG TABS Take 1 tablet by mouth every 4 (four) hours as needed (cold symptoms).   umeclidinium-vilanterol (ANORO ELLIPTA ) 62.5-25 MCG/ACT AEPB Inhale 1 puff into the lungs daily.   amoxicillin -clavulanate (AUGMENTIN ) 875-125 MG tablet Take 1 tablet by mouth every 12 (twelve) hours. (Patient not taking: Reported on 09/04/2024)   nicotine  (NICODERM CQ  - DOSED IN MG/24 HOURS) 14 mg/24hr patch Place 1 patch (14 mg total) onto the skin daily. (Patient not taking: Reported on 09/04/2024)   predniSONE  (DELTASONE ) 10 MG tablet Take 5 tablets (50 mg total) by mouth daily. (Patient not taking: Reported on 09/04/2024)   Varenicline  Tartrate, Starter, (CHANTIX  STARTING MONTH PAK) 0.5 MG X 11 & 1 MG X 42 TBPK Take 1 tablet by mouth daily. (Patient not taking: Reported on 09/04/2024)   No facility-administered encounter medications on file as of 09/04/2024.     Physical Exam: Today's Vitals   09/04/24 1046  BP: (!) 165/77  Pulse: 61  Temp: 97.6 F (36.4 C)  TempSrc: Oral  SpO2: 97%  Weight: 283 lb (128.4 kg)  Height: 6' 1 (1.854 m)   Body mass index is 37.34 kg/m.  Physical Exam GEN: No acute distress. CV: Regular rate and rhythm, no murmurs. LUNGS: Clear to auscultation bilaterally,  normal respiratory effort, no wheezing. SKIN JOINTS: Warm and dry, no rash.  Data Reviewed: Imaging: CTA 12/31/2022-no pulmonary embolism, multiple calcified pleural plaques.  Areas of atelectasis, scarring bilaterally.  Diffuse bronchial wall thickening with moderate to severe emphysema.  Right middle lobe pulmonary nodule measuring 5 mm and 10 mm.  Other pulmonary nodules are stable.  Screening CT chest 08/23/2024-extensive bilateral pleural plaques.  Moderate to severe emphysema, new 7.6 mm and 6.8 mm lung nodule.  Other lung nodules are stable I have reviewed the images personally.  PFTs: 10/17/2017 FVC 2.95 [55%], FEV1 2.00 [47%], F/F67 Severe obstruction  Labs: CBC 10/15/2023-WBC 11.2, eos 2%, absolute eosinophil count 224  Assessment & Plan Severe COPD with acute exacerbation Mild exacerbation of COPD with increased dyspnea and productive cough with yellow sputum. No wheezing on examination, but nocturnal wheezing reported. Likely due to COPD exacerbation rather than pneumonia, as recent CT scan did not show pneumonia. Lung function likely worsened since 2019 due to continued smoking. - Continue Anoro inhaler. - Prescribed Z-Pak (azithromycin ) and prednisone  for 5 days. - Ordered lung function test (PFT) to be done in three months.  They were previously ordered but not completed yet - Discussed potential use of Ohtuvayre  nebulizer for symptom management. - Discussed pulmonary rehabilitation options, including in-person and virtual options.  Pulmonary nodules under surveillance New pulmonary nodules identified on recent CT scan, measuring 7.6 mm and 6.8 mm. Previous nodules are stable. Follow-up CT scan scheduled for February 2026 to monitor changes. - Ensure follow-up CT scan is scheduled for February 2026.  Tobacco use disorder Long-standing tobacco use disorder with smoking a pack per day for nearly 40 years. Previous cessation attempts unsuccessful. Financial constraints due to  lack of insurance may limit access to medications.  Time spent counseling-3 minutes. - Unable to afford Chantix  and nicotine  patches - Encouraged smoking cessation on his own.  Suspected obstructive sleep apnea Suspected obstructive sleep apnea based on poor sleep quality, snoring, and daytime somnolence. Financial constraints may limit ability to perform a home sleep study. - Discuss potential for home sleep study if financial situation improves  Pleural calcifications with possible prior asbestos exposure Pleural calcifications on CT scan suggest possible prior asbestos exposure. No current evidence of asbestosis. No known occupational exposure, but possible incidental exposure in the past. - Monitor for any progression or development of asbestosis.   Recommendations: Continue Anoro Z-Pak, prednisone  PFTs Ohtuvayre  Pulmonary rehabilitation  Lonna Coder MD [facility] 09/04/2024, 11:03 AM  CC: Tanda Bleacher, MD

## 2024-09-12 ENCOUNTER — Telehealth: Payer: Self-pay

## 2024-09-12 ENCOUNTER — Other Ambulatory Visit (HOSPITAL_COMMUNITY): Payer: Self-pay

## 2024-09-12 NOTE — Telephone Encounter (Signed)
 Received Ohtuvayre  new start paperwork. Completed form and faxed with clinicals and insurance card copy to [facility]   Phone#: [phone] Fax#: [phone]

## 2024-09-13 NOTE — Telephone Encounter (Signed)
 Received fax from [employer] confirming receipt of enrollment form.  Patient ID: [id number]

## 2024-10-08 ENCOUNTER — Encounter: Payer: Self-pay | Admitting: Pulmonary Disease

## 2024-10-14 ENCOUNTER — Ambulatory Visit (HOSPITAL_BASED_OUTPATIENT_CLINIC_OR_DEPARTMENT_OTHER): Payer: Self-pay

## 2024-10-14 ENCOUNTER — Telehealth: Payer: Self-pay | Admitting: Family Medicine

## 2024-10-14 DIAGNOSIS — J441 Chronic obstructive pulmonary disease with (acute) exacerbation: Secondary | ICD-10-CM

## 2024-10-14 LAB — PULMONARY FUNCTION TEST
DL/VA % pred: 84 %
DL/VA: 3.55 ml/min/mmHg/L
DLCO unc % pred: 51 %
DLCO unc: 15.54 ml/min/mmHg
FEF 25-75 Post: 1.06 L/s
FEF 25-75 Pre: 0.73 L/s
FEF2575-%Change-Post: 45 %
FEF2575-%Pred-Post: 32 %
FEF2575-%Pred-Pre: 22 %
FEV1-%Change-Post: 10 %
FEV1-%Pred-Post: 41 %
FEV1-%Pred-Pre: 37 %
FEV1-Post: 1.68 L
FEV1-Pre: 1.52 L
FEV1FVC-%Change-Post: 0 %
FEV1FVC-%Pred-Pre: 79 %
FEV6-%Change-Post: 14 %
FEV6-%Pred-Post: 55 %
FEV6-%Pred-Pre: 48 %
FEV6-Post: 2.83 L
FEV6-Pre: 2.48 L
FEV6FVC-%Change-Post: 2 %
FEV6FVC-%Pred-Post: 104 %
FEV6FVC-%Pred-Pre: 101 %
FVC-%Change-Post: 11 %
FVC-%Pred-Post: 53 %
FVC-%Pred-Pre: 47 %
FVC-Post: 2.83 L
FVC-Pre: 2.54 L
Post FEV1/FVC ratio: 60 %
Post FEV6/FVC ratio: 100 %
Pre FEV1/FVC ratio: 60 %
Pre FEV6/FVC Ratio: 98 %
RV % pred: 110 %
RV: 2.64 L
TLC % pred: 71 %
TLC: 5.45 L

## 2024-10-14 NOTE — Patient Instructions (Signed)
 Full PFT performed today.

## 2024-10-14 NOTE — Telephone Encounter (Signed)
 Copied from CRM [id number]. Topic: General - Other >> Oct 14, 2024 10:10 AM Hadassah PARAS wrote: Reason for CRM: Pt has recently became unemployed due to health problems. Pt is req resources for low-no cost insurance. Please advise pt on #[id number]

## 2024-10-14 NOTE — Telephone Encounter (Signed)
 Please advise

## 2024-10-14 NOTE — Progress Notes (Signed)
 Full PFT performed today.

## 2024-10-17 ENCOUNTER — Telehealth: Payer: Self-pay

## 2024-10-17 NOTE — Telephone Encounter (Signed)
 Copied from CRM [id number]. Topic: General - Other >> Oct 17, 2024  8:26 AM Russell PARAS wrote: Reason for CRM:   Pt is requesting letter stating his physical limitations from provider for his employer, due to having great difficulty working. He has spoken with provider concerning this in the past.   CB#  [phone]  Dr Theophilus, please advise what the limitations would be and for how long and I can write the letter if you agree to it.

## 2024-10-23 ENCOUNTER — Telehealth (HOSPITAL_BASED_OUTPATIENT_CLINIC_OR_DEPARTMENT_OTHER): Payer: Self-pay | Admitting: Pulmonary Disease

## 2024-10-23 NOTE — Telephone Encounter (Signed)
 Spoke with patient and scheduled nodule follow up CT 11/20/24. Blue card mailed to pt.

## 2024-10-23 NOTE — Telephone Encounter (Signed)
 Copied from CRM #[id number]. Topic: Appointments - Scheduling Inquiry for Clinic >> Oct 23, 2024 11:28 AM Ray Griffin wrote: Reason for CRM: pt also is ready to schedule his 66month CT low dose LCS.

## 2024-10-24 NOTE — Telephone Encounter (Signed)
 Copied from CRM [id number]. Topic: Clinical - Lab/Test Results >> Oct 22, 2024  9:58 AM Rilla B wrote: Reason for CRM: Patient states he had a PFT on Monday 1/12, and needs to go over results. States he is unable to work. Please call patient @ [phone]. (Okay to send results to MyChart) >> Oct 23, 2024 11:25 AM Rozanna MATSU wrote: Pt calling back about his PFT results please reach out to pt to give him so clarity on the test    Return in about 3 months (around 12/03/2024) for With Dr. Praveen Mannam, or APP, after PFTs.   VISIT SUMMARY: You visited us  today due to worsening cough and fatigue related to your severe COPD and emphysema. We discussed your symptoms, current medications, and potential treatment options to help manage your condition.   YOUR PLAN: COPD WITH ACUTE EXACERBATION: You are experiencing a mild exacerbation of your COPD with increased shortness of breath and productive cough. -Continue using your Anoro inhaler as prescribed. -Take the Z-Pak (azithromycin ) and prednisone  for 5 days as prescribed. -We have ordered a lung function test (PFT) to be done in three months. -Consider using an Otowear nebulizer for symptom management. -Explore pulmonary rehabilitation options, including both in-person and virtual options.   SEVERE EMPHYSEMA: Your severe emphysema, diagnosed in 2019, appears to have worsened, likely due to continued smoking. -We have ordered a lung function test (PFT) to be done in three months. -Explore pulmonary rehabilitation options, including both in-person and virtual options.   PULMONARY NODULES UNDER SURVEILLANCE: New pulmonary nodules were identified on your recent CT scan. These will need to be monitored. -Ensure you have a follow-up CT scan scheduled for February 2026 to monitor the nodules.   PLEURAL PLAQUES DUE TO ASBESTOS EXPOSURE: Pleural thickening and calcification were noted on your CT scan, likely due to past asbestos exposure. -We will  continue to monitor these pleural changes with follow-up imaging as needed.

## 2024-10-29 NOTE — Telephone Encounter (Signed)
 I called and reviewed PFT results with patient. Nothing further needed

## 2024-11-20 ENCOUNTER — Other Ambulatory Visit: Payer: Self-pay

## 2024-11-27 ENCOUNTER — Ambulatory Visit: Admitting: Behavioral Health

## 2024-12-17 ENCOUNTER — Ambulatory Visit: Payer: Self-pay | Admitting: Pulmonary Disease

## 2024-12-19 ENCOUNTER — Ambulatory Visit: Payer: Self-pay | Admitting: Family Medicine
